# Patient Record
Sex: Male | Born: 1945 | Race: White | Hispanic: No | Marital: Married
Health system: Midwestern US, Academic
[De-identification: ages and names within clinical notes are randomized; demographics above are authoritative.]

---

## 2016-12-27 MED ORDER — ATORVASTATIN 40 MG PO TAB
ORAL_TABLET | Freq: Every day | 3 refills | Status: DC
Start: 2016-12-27 — End: 2018-01-31

## 2017-02-03 ENCOUNTER — Encounter: Admit: 2017-02-03 | Discharge: 2017-02-03 | Payer: MEDICARE

## 2017-02-04 MED ORDER — CANDESARTAN 32 MG PO TAB
ORAL_TABLET | Freq: Every day | ORAL | 3 refills | Status: AC
Start: 2017-02-04 — End: 2018-02-13

## 2017-03-02 ENCOUNTER — Encounter: Admit: 2017-03-02 | Discharge: 2017-03-02 | Payer: MEDICARE

## 2017-03-02 NOTE — Telephone Encounter
VM from wife reporting BP detected HR of 38 bpm then 45 bmp today in clinic. EKG done, stable HR 66.     She said they were at the nephrologist today when the Marc Nelson's VS were taken with the machine, HR was 38 then 45 bmp. She said the doctor listened to his heart then and reported if "sounded good" and ECG revealed HR of 66.  Pt was asymptomatic.     Pt has medtronic PPM with remote. Pt to send tonight when he gets home. Explained PVC's to wife and likely the BP cuff was unable to detect HR accurately. Pt has PMC of PVC's and remote reveals relatively high PVC count.     We added pt to schedule for 9/4 and will review remote to see if findings warrant sooner work in. Wife says he has generalized "tiredness" most days but is out of the home now running errands and essentially asymptomatic.     Will review remote and go from there.

## 2017-03-03 ENCOUNTER — Encounter: Admit: 2017-03-03 | Discharge: 2017-03-03 | Payer: MEDICARE

## 2017-03-03 NOTE — Telephone Encounter
-----   Message from Foye Deer, RN sent at 03/03/2017  6:01 AM CDT -----  Regarding: RE: pt told HR 38 then 45 in doctor's office today.   Remote sent, all normal, no events. I will chart later today.    Erlene Quan    ----- Message -----  From: Verdis Frederickson, RN  Sent: 03/02/2017   4:57 PM  To: Cherlyn Labella Remote  Subject: pt told HR 38 then 45 in doctor's office tod#    A team,     ECG confirmed 66, has medtronic ppm with high PVC count. Sending a transmission tonight once he gets home. He was asymptomatic at the time.     Please look for and flag me when done.   Thank you

## 2017-03-03 NOTE — Telephone Encounter
Phoned pt/wife to let them  know the remote transmission was WNL. No abnormalities.     No new orders. Will f/u with pt in September.

## 2017-03-14 ENCOUNTER — Encounter: Admit: 2017-03-14 | Discharge: 2017-03-14 | Payer: MEDICARE

## 2017-03-14 LAB — LIPID PROFILE: Lab: 129

## 2017-03-14 LAB — MAGNESIUM: Lab: 2.1

## 2017-03-14 LAB — COMPREHENSIVE METABOLIC PANEL
Lab: 30
Lab: 5.3 — ABNORMAL HIGH
Lab: 1.1
Lab: 109 — ABNORMAL HIGH
Lab: 141 — ABNORMAL LOW
Lab: 23
Lab: 3.3

## 2017-03-16 ENCOUNTER — Encounter: Admit: 2017-03-16 | Discharge: 2017-03-16 | Payer: MEDICARE

## 2017-03-16 NOTE — Progress Notes
Pt's wife called back, states they were not given any additional instructions and he is still taking the Atacand and all other meds.   Routing to OmnicomJennifer Bailey RN

## 2017-03-16 NOTE — Progress Notes
Faxed request [phone]) asking what was ordered for pt's high K+ drawn by Dr. Lucky Rathkeosen's office. They called back with no special orders given.     Nurse answered there were not notes in the chart with orders or that pt was given special instructions. She says they are generally told to increase fluid, decrease potassium in the diet and complete follow up lab work.     Called pt to see if he was given any special instructions. No answer, LVM requesting.

## 2017-03-23 ENCOUNTER — Encounter: Admit: 2017-03-23 | Discharge: 2017-03-23 | Payer: MEDICARE

## 2017-03-23 DIAGNOSIS — I480 Paroxysmal atrial fibrillation: Principal | ICD-10-CM

## 2017-03-23 NOTE — Telephone Encounter
Letter out with lab orders.

## 2017-03-24 ENCOUNTER — Encounter: Admit: 2017-03-24 | Discharge: 2017-03-24 | Payer: MEDICARE

## 2017-03-24 DIAGNOSIS — I480 Paroxysmal atrial fibrillation: Principal | ICD-10-CM

## 2017-03-24 LAB — BASIC METABOLIC PANEL
Lab: 138
Lab: 34 — ABNORMAL HIGH (ref 7–26)
Lab: 4.5
Lab: 66
Lab: 9
Lab: 1.1
Lab: 10
Lab: 112
Lab: 153 — ABNORMAL HIGH (ref 70–100)
Lab: 16 — ABNORMAL LOW (ref 20–32)
Lab: 80

## 2017-03-24 NOTE — Progress Notes
[facility]

## 2017-03-25 ENCOUNTER — Encounter: Admit: 2017-03-25 | Discharge: 2017-03-25 | Payer: MEDICARE

## 2017-03-25 NOTE — Progress Notes
VOV: Contact [facility]'s [facility] Calling Dr. Maude LericheWhitlow to ensure labs were seen and being addressed. per RKB.     Phoned Dr. Garnette CzechWhitlow's office where the labs were drawn and spoke with RN Byrd HesselbachMaria to ensure she addresses the repeat BMP with Dr. Maude LericheWhitlow.   Potassium level has resolved, and she agreed to review with Dr. Maude LericheWhitlow promptly.

## 2017-03-30 ENCOUNTER — Encounter: Admit: 2017-03-30 | Discharge: 2017-03-30 | Payer: MEDICARE

## 2017-03-30 NOTE — Progress Notes
[facility] the [facility] after reviewing with RKB in clinic last week and asking PCP to follow up with abnormal CO2 results.  [facility] agrees, no new orders from [place]. Pt to follow up with us on 04/20/17.    Care Everyhwhere reviewed.

## 2017-04-06 ENCOUNTER — Encounter: Admit: 2017-04-06 | Discharge: 2017-04-06 | Payer: MEDICARE

## 2017-04-06 LAB — BASIC METABOLIC PANEL
Lab: 0.9
Lab: 10
Lab: 100
Lab: 109
Lab: 138
Lab: 164 — ABNORMAL HIGH (ref 70–100)
Lab: 17
Lab: 21
Lab: 4.4
Lab: 8
Lab: 83

## 2017-04-15 ENCOUNTER — Encounter: Admit: 2017-04-15 | Discharge: 2017-04-15 | Payer: MEDICARE

## 2017-04-15 NOTE — Telephone Encounter
MyChart Message with WNL lab results.

## 2017-04-15 NOTE — Telephone Encounter
-----   Message from Myriam JacobsonAshwani Mehta, MD sent at 04/13/2017  5:52 PM CDT -----  BMP stable

## 2017-04-19 ENCOUNTER — Encounter: Admit: 2017-04-19 | Discharge: 2017-04-19 | Payer: MEDICARE

## 2017-04-19 DIAGNOSIS — E119 Type 2 diabetes mellitus without complications: Secondary | ICD-10-CM

## 2017-04-19 DIAGNOSIS — E785 Hyperlipidemia, unspecified: Secondary | ICD-10-CM

## 2017-04-19 DIAGNOSIS — I480 Paroxysmal atrial fibrillation: ICD-10-CM

## 2017-04-19 DIAGNOSIS — I495 Sick sinus syndrome: Secondary | ICD-10-CM

## 2017-04-19 DIAGNOSIS — I208 Other forms of angina pectoris: ICD-10-CM

## 2017-04-19 DIAGNOSIS — I2510 Atherosclerotic heart disease of native coronary artery without angina pectoris: ICD-10-CM

## 2017-04-19 DIAGNOSIS — I10 Essential (primary) hypertension: Principal | ICD-10-CM

## 2017-04-19 DIAGNOSIS — T50905A Adverse effect of unspecified drugs, medicaments and biological substances, initial encounter: ICD-10-CM

## 2017-04-19 DIAGNOSIS — R9439 Abnormal result of other cardiovascular function study: ICD-10-CM

## 2017-04-19 NOTE — Progress Notes
Date of Service: 04/19/2017    Marc Nelson is a 71 y.o. male.       HPI     It was a pleasure to see [name] in my office today for his cardiovascular follow-up. ???As always he was accompanied by his wife.  He denies any chest discomfort, shortness of breath, dizziness or lightheadedness.  No palpitations.  Physical examination is unremarkable.  Vital signs are stable.  His EKG shows a paced rhythm with a long AV delay.  There is a V sensed rhythm.  Followed by a long pause in ventricularly paced beat.  He is completely asymptomatic.  I recommended that we set up a pacemaker check in the office.  Medications were reviewed and no changes at this time.         Vitals:    04/19/17 0853 04/19/17 0901   BP: 123/71 123/66   Pulse: 60    Weight: 84.4 kg (186 lb)    Height: 1.778 m (5' 10)      Body mass index is 26.69 kg/m???.     Past Medical History  Patient Active Problem List    Diagnosis Date Noted   ??? Stable angina (HCC) 10/01/2016   ??? Sinus node dysfunction (HCC) 05/25/2016   ??? 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 Essential hypertension 05/25/2016   ??? Bradycardia 03/24/2016     03/24/2016  dual chamber MDT ppm implanted, ILR removed      ??? 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 Dizziness 12/27/2013   ??? Paroxysmal A-fib (HCC) 09/03/2011     06/20/2012 Holter monitor: revealed normal sinus rhythm, short runs of supraventricular arrhythmias, maximum of 10 beats were noted. There were no episodes of any sustained ventricular or supraventricular arrhythmias noted.   06/21/2012 Echo: EF 60 %, normal LV systolic function. Mild mitral and tricuspid regurgitations. PA pressures were 33 mmHg. LA size 5.4 cm.   07/23/2013 Stress thallium: The patient exercised for 11 minute and 54 seconds. During peak exercise, there was 1 mm horizontal ST depression in II, III, aVF, and V5 and V6. However, SPECT images did not show any significant ischemia. Ejection fraction was noted to be 61 percent. 12/27/13 Holter: The predominating rhythm was NS. Rare Supraventricular Ectopic Beats were observed including Atrial Couplets and a Triplet. Rare Ventricular Ectopic Beats were recognized including Rare Couplets. Patient complained of SOB and weakness. No correlation of his symptoms were noted to any underlying arrhythmia.   06/21/2014 Exercise stress thallium: noted to be normal.   05/06/15 Echo: The LV EF is about 60%. The LA is mildly enlarged. Mild MR & mild TR noted. The PA systolic pressure is normal. LA size 4.5 cm.   10/20/2015 Regadenoson stress Cardiolite [facility]): no evidence of any significant ischemia and normal ejection fraction at 69%.  10/24/2015 Echo: normal left ventricular function.??? Ejection fraction 65%.??? 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 LA size 4.3 cm. ??? ???  11/24/2015 30-day looping event monitor: multiple episodes of atrial fibrillation and flutter with ventricular rates ranging between 80-110 beats per minute.??? One episode of atrial fibrillation was associated with complaints of heart racing  01/06/16 s/p Medtronic LINQ cardiac memory loop recorder at the anterior chest wall (Dr. Bernette Mayers)      ??? Thrombocytopenia (HCC) 08/26/2011   ??? AKI (acute kidney  injury) (HCC) 08/26/2011   ??? CAD (coronary artery disease) 08/23/2011   ??? Angina of effort (HCC) 08/23/2011   ??? Chest pain 07/30/2011   ??? SOB (shortness of breath) 07/15/2011   ??? Dyslipidemia 10/22/2009   ??? DM II (diabetes mellitus, type II), controlled (HCC) 10/22/2009   ??? HTN, goal below 140/90 01/02/2009         Review of Systems   HENT: Positive for hearing loss and tinnitus.    Cardiovascular: Positive for dyspnea on exertion.   Musculoskeletal: Positive for arthritis, joint pain and neck pain.   Gastrointestinal: Positive for flatus. Allergic/Immunologic: Positive for environmental allergies.   All other systems reviewed and are negative.      Physical Exam  General Appearance: resting comfortably, no acute distress   Neck Veins: neck veins are flat, neck veins are not distended   Thyroid: no nodules, masses, tenderness or enlargement   Chest Inspection: chest is normal in appearance, CABG incision   Respiratory Effort: breathing is unlabored, no respiratory distress   Auscultation/Percussion: lungs clear to auscultation, no rales, rhonchi, or wheezing   PMI: PMI not enlarged or displaced   Cardiac Rhythm: regular rhythm and normal rate   Cardiac Auscultation: Normal S1 &???S2, no S3 or S4, no rub   Murmurs: soft holosystolic murmur 2/6 at apex with no radiation   Carotid Arteries: normal carotid upstroke bilaterally, no bruits   Pedal Pulses: normal symmetric pedal pulses   Lower Extremity Edema: no lower extremity edema   Abdominal Exam: soft, non-tender, no masses, bowel sounds normal   Abdominal Aorta: nonpalpable abdominal aorta; no abdominal bruits   Liver &???Spleen: no organomegaly   Neurologic Exam: neurological assessment grossly intact   Orientation: oriented to time, place and person    Cardiovascular Studies  June 20, 2012: A 24-hour Holter monitor revealed normal sinus rhythm, short runs of supraventricular arrhythmias, maximum of 10 beats were noted. There were no episodes of any sustained ventricular or supraventricular arrhythmias noted. June 21, 2012: Echo Doppler ejection fraction 60 percent, normal left ventricular systolic function. Mild mitral and tricuspid regurgitations. PA pressures were 33 mmHg. July 23, 2013, stress thallium: The patient exercised for 11 minute and 54 seconds. During peak exercise, there was 1 mm horizontal ST depression in II, III, aVF, and V5 and V6. However, SPECT images did not show any significant ischemia. Ejection fraction was noted to be 61 percent. June 21, 2014, exercise stress thallium noted to be normal. EKG today shows sinus bradycardia, rate of 46 beats per minute. First-degree AV block. No significant ST-T changes are noted. No significant changes compared to patient's previous electrocardiogram done on June 19, 2014   October 20, 2015, regadenoson stress Cardiolite done at [facility] revealed no evidence of any significant ischemia and normal ejection fraction at 69%.  October 24, 2015, echo Doppler???shows normal left ventricular function. ???Ejection fraction 65%. 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 ???  ???November 24, 2015, a 30-day looping event monitor showed multiple episodes of atrial fibrillation and flutter with ventricular rates ranging between 80-110 beats per minute. ???One episode of atrial fibrillation was associated with complaints of heart racing.  March 25, 2016 placement of a permanent pacemaker. ???This a dual-chamber Medtronic pacemaker  Today's EKG as noted above.  Problems Addressed Today  No diagnosis found.    Assessment and Plan  1. ???History of paroxysmal atrial fibrillation. ???Patient maintaining a paced rhythm.  Patient on Eliquis.  2. 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 ???Remote device check showed a normally functioning device.  Will need a office pacemaker interrogation.  3. ???Coronary artery disease status post coronary artery bypass graft surgery. ???Clinically and hemodynamically doing well with no complaints of any chest discomfort. ???Stress test done in March 2017 at [facility] and revealed no evidence of any significant ischemia. ???Ejection fraction is normal at 69%.  4. ???Hypertension. ???Blood pressures are pretty well controlled.  5. ???Dyslipidemia. ???Good lipid numbers.  6. ???Diabetes mellitus. ???Patient follows his PCP. Current Medications (including today's revisions)  ??? aspirin EC 81 mg tablet Take 81 mg by mouth daily. Take with food.   ??? atorvastatin (LIPITOR) 40 mg tablet TAKE ONE TABLET BY MOUTH DAILY   ??? candesartan (ATACAND) 32 mg tablet TAKE ONE-HALF TABLET BY MOUTH DAILY   ??? CETIRIZINE HCL (ZYRTEC PO) Take 1 Tab by mouth as Needed.   ??? cholecalciferol (Vitamin D3) (VITAMIN D-3) 1,000 units tablet Take 2,000 Units by mouth daily.     ??? citalopram (CELEXA) 10 mg tablet Take 10 mg by mouth daily.   ??? Coenzyme Q10 150 mg cap Take 1 Cap by mouth daily.   ??? ELIQUIS 5 mg tablet TAKE ONE TABLET BY MOUTH TWICE A DAY   ??? INSULIN GLARGINE,HUM.REC.ANLOG (LANTUS SC) Inject 25 Units under the skin at bedtime daily.   ??? metoprolol tartrate (LOPRESSOR) 25 mg tablet Take 1 tablet by mouth twice daily.   ??? nitroglycerin (NITROSTAT) 0.4 mg tablet Place 1 tab under tongue if having chest pain. May take 1 tab every 5 minutes up to 3 tabs. If chest pain persists after 3 doses, call 911.   ??? NOVOLOG FLEXPEN 100 unit/mL injection PEN 3 Units.   ??? omeprazole DR(+) (PRILOSEC) 20 mg capsule Take 2 Caps by mouth daily. (Patient taking differently: Take 20 mg by mouth daily.)   ??? travoprost(+) (TRAVATAN Z) 0.004 % drop ophthalmic solution Apply 1 drop to both eyes at bedtime daily.

## 2017-04-20 ENCOUNTER — Ambulatory Visit: Admit: 2017-04-19 | Discharge: 2017-04-20 | Payer: MEDICARE

## 2017-04-20 DIAGNOSIS — I480 Paroxysmal atrial fibrillation: Principal | ICD-10-CM

## 2017-04-20 DIAGNOSIS — I2510 Atherosclerotic heart disease of native coronary artery without angina pectoris: ICD-10-CM

## 2017-04-20 DIAGNOSIS — Z951 Presence of aortocoronary bypass graft: ICD-10-CM

## 2017-04-20 DIAGNOSIS — Z950 Presence of cardiac pacemaker: ICD-10-CM

## 2017-04-20 DIAGNOSIS — R9431 Abnormal electrocardiogram [ECG] [EKG]: ICD-10-CM

## 2017-04-20 DIAGNOSIS — E119 Type 2 diabetes mellitus without complications: ICD-10-CM

## 2017-04-20 DIAGNOSIS — Z794 Long term (current) use of insulin: ICD-10-CM

## 2017-04-20 DIAGNOSIS — I10 Essential (primary) hypertension: ICD-10-CM

## 2017-04-20 DIAGNOSIS — Z7901 Long term (current) use of anticoagulants: ICD-10-CM

## 2017-04-21 ENCOUNTER — Encounter: Admit: 2017-04-21 | Discharge: 2017-04-21 | Payer: MEDICARE

## 2017-05-10 ENCOUNTER — Ambulatory Visit: Admit: 2017-05-10 | Discharge: 2017-05-11 | Payer: MEDICARE

## 2017-05-11 DIAGNOSIS — I495 Sick sinus syndrome: Principal | ICD-10-CM

## 2017-05-11 DIAGNOSIS — Z950 Presence of cardiac pacemaker: ICD-10-CM

## 2017-05-13 ENCOUNTER — Ambulatory Visit: Admit: 2017-05-13 | Discharge: 2017-05-13 | Payer: MEDICARE

## 2017-05-13 DIAGNOSIS — I480 Paroxysmal atrial fibrillation: Principal | ICD-10-CM

## 2017-05-13 MED ORDER — PERFLUTREN LIPID MICROSPHERES 1.1 MG/ML IV SUSP
1-20 mL | Freq: Once | INTRAVENOUS | 0 refills | Status: CP
Start: 2017-05-13 — End: ?
  Administered 2017-05-13: 14:00:00 2.5 mL via INTRAVENOUS

## 2017-05-13 NOTE — Progress Notes
Peripheral IV Insertion Note:  Patient Side: left  Line Orientation:Antecubital  IV Catheter Size: 24G  Number of Attempts:2.  IV capped and flushed with Normal Saline.  IV site without redness, swelling, or pain.  New dressing placed.    After procedure IV cannula removed intact and hemostasis achieved.

## 2017-05-19 ENCOUNTER — Encounter: Admit: 2017-05-19 | Discharge: 2017-05-19 | Payer: MEDICARE

## 2017-05-19 NOTE — Telephone Encounter
-----   Message from Myriam Jacobson, MD sent at 05/19/2017  4:52 PM CDT -----  Echocardiogram revealed normal left ventricular function with ejection fraction of around 50%.  No significant changes from his last echo Doppler.  We will continue present medical management.

## 2017-05-19 NOTE — Telephone Encounter
Mychart message out with stable echocardiogram results.

## 2017-06-02 ENCOUNTER — Encounter: Admit: 2017-06-02 | Discharge: 2017-06-02 | Payer: MEDICARE

## 2017-06-02 MED ORDER — METOPROLOL TARTRATE 25 MG PO TAB
ORAL_TABLET | Freq: Two times a day (BID) | ORAL | 3 refills | 90.00000 days | Status: AC
Start: 2017-06-02 — End: 2018-06-29

## 2017-06-13 ENCOUNTER — Encounter: Admit: 2017-06-13 | Discharge: 2017-06-13 | Payer: MEDICARE

## 2017-06-13 LAB — LIPID PROFILE
Lab: 14
Lab: 37
Lab: 53
Lab: 67
Lab: 104

## 2017-06-13 LAB — COMPREHENSIVE METABOLIC PANEL
Lab: 0.4
Lab: 139
Lab: 20
Lab: 22
Lab: 4.1
Lab: 59
Lab: 6.3
Lab: 87

## 2017-06-13 LAB — MAGNESIUM: Lab: 1.9

## 2017-06-13 NOTE — Progress Notes
Lab entered for historical purpose

## 2017-06-30 ENCOUNTER — Encounter: Admit: 2017-06-30 | Discharge: 2017-06-30 | Payer: MEDICARE

## 2017-06-30 DIAGNOSIS — E785 Hyperlipidemia, unspecified: ICD-10-CM

## 2017-06-30 DIAGNOSIS — I495 Sick sinus syndrome: Principal | ICD-10-CM

## 2017-08-10 ENCOUNTER — Ambulatory Visit: Admit: 2017-08-10 | Discharge: 2017-08-11 | Payer: MEDICARE

## 2017-08-11 ENCOUNTER — Encounter: Admit: 2017-08-11 | Discharge: 2017-08-11 | Payer: MEDICARE

## 2017-08-11 DIAGNOSIS — I495 Sick sinus syndrome: Principal | ICD-10-CM

## 2017-08-11 DIAGNOSIS — Z950 Presence of cardiac pacemaker: ICD-10-CM

## 2017-08-29 LAB — LIPID PROFILE
Lab: 136 fL — ABNORMAL LOW (ref 4.5–6.5)
Lab: 14 10*3/uL — ABNORMAL HIGH (ref 27–32)
Lab: 3.2 {cells}/uL — ABNORMAL LOW (ref >60)
Lab: 43 g/dL (ref 32.0–36.0)
Lab: 71 pg — ABNORMAL HIGH (ref 27.0–33.0)
Lab: 79 % (ref 11.0–15.0)
Lab: 93 fL — ABNORMAL LOW (ref >60)

## 2017-08-29 LAB — CBC
Lab: 11 {cells}/uL (ref 200–950)
Lab: 6.4 % — ABNORMAL LOW (ref >=60)

## 2017-08-29 LAB — TOTAL T3 (TRIIODOTHYRONINE): Lab: 110

## 2017-08-29 LAB — COMPREHENSIVE METABOLIC PANEL
Lab: 141 g/dL — ABNORMAL LOW (ref >=60)
Lab: 22 {cells}/uL (ref 0–200)
Lab: 27 % (ref 33–130)
Lab: 80 {cells}/uL (ref 15–500)

## 2017-08-29 LAB — FREE T4 (FREE THYROXINE) ONLY: Lab: 0.9 — ABNORMAL LOW (ref 0.93–1.7)

## 2017-08-29 LAB — THYROID STIMULATING HORMONE-TSH: Lab: 1.8

## 2017-08-29 LAB — 25-OH VITAMIN D (D2 + D3): Lab: 34

## 2017-10-03 ENCOUNTER — Encounter: Admit: 2017-10-03 | Discharge: 2017-10-03 | Payer: MEDICARE

## 2017-10-04 ENCOUNTER — Encounter: Admit: 2017-10-04 | Discharge: 2017-10-04 | Payer: MEDICARE

## 2017-10-21 ENCOUNTER — Encounter: Admit: 2017-10-21 | Discharge: 2017-10-21 | Payer: MEDICARE

## 2017-10-21 DIAGNOSIS — I480 Paroxysmal atrial fibrillation: ICD-10-CM

## 2017-10-21 DIAGNOSIS — Z950 Presence of cardiac pacemaker: Secondary | ICD-10-CM

## 2017-10-21 DIAGNOSIS — T50905A Adverse effect of unspecified drugs, medicaments and biological substances, initial encounter: ICD-10-CM

## 2017-10-21 DIAGNOSIS — I2510 Atherosclerotic heart disease of native coronary artery without angina pectoris: ICD-10-CM

## 2017-10-21 DIAGNOSIS — I208 Other forms of angina pectoris: ICD-10-CM

## 2017-10-21 DIAGNOSIS — I10 Essential (primary) hypertension: Principal | ICD-10-CM

## 2017-10-21 DIAGNOSIS — R9439 Abnormal result of other cardiovascular function study: ICD-10-CM

## 2017-10-21 DIAGNOSIS — E119 Type 2 diabetes mellitus without complications: Secondary | ICD-10-CM

## 2017-10-21 DIAGNOSIS — Z951 Presence of aortocoronary bypass graft: Secondary | ICD-10-CM

## 2017-10-21 DIAGNOSIS — E785 Hyperlipidemia, unspecified: Secondary | ICD-10-CM

## 2017-10-22 ENCOUNTER — Ambulatory Visit: Admit: 2017-10-21 | Discharge: 2017-10-22 | Payer: MEDICARE

## 2017-10-22 DIAGNOSIS — I10 Essential (primary) hypertension: ICD-10-CM

## 2017-10-22 DIAGNOSIS — I2510 Atherosclerotic heart disease of native coronary artery without angina pectoris: ICD-10-CM

## 2017-10-22 DIAGNOSIS — I480 Paroxysmal atrial fibrillation: ICD-10-CM

## 2017-10-22 DIAGNOSIS — I495 Sick sinus syndrome: ICD-10-CM

## 2017-10-22 DIAGNOSIS — E785 Hyperlipidemia, unspecified: ICD-10-CM

## 2017-10-22 DIAGNOSIS — Z7901 Long term (current) use of anticoagulants: ICD-10-CM

## 2017-10-22 DIAGNOSIS — R0789 Other chest pain: Principal | ICD-10-CM

## 2017-10-22 DIAGNOSIS — E119 Type 2 diabetes mellitus without complications: ICD-10-CM

## 2017-11-09 ENCOUNTER — Ambulatory Visit: Admit: 2017-11-09 | Discharge: 2017-11-10 | Payer: MEDICARE

## 2017-11-09 ENCOUNTER — Encounter: Admit: 2017-11-09 | Discharge: 2017-11-09 | Payer: MEDICARE

## 2017-11-09 DIAGNOSIS — Z950 Presence of cardiac pacemaker: Secondary | ICD-10-CM

## 2017-11-10 DIAGNOSIS — I495 Sick sinus syndrome: Principal | ICD-10-CM

## 2017-11-10 DIAGNOSIS — I440 Atrioventricular block, first degree: ICD-10-CM

## 2017-11-16 ENCOUNTER — Encounter: Admit: 2017-11-16 | Discharge: 2017-11-16 | Payer: MEDICARE

## 2017-11-16 MED ORDER — ELIQUIS 5 MG PO TAB
ORAL_TABLET | Freq: Two times a day (BID) | 10 refills | Status: AC
Start: 2017-11-16 — End: 2018-10-30

## 2017-11-17 ENCOUNTER — Encounter: Admit: 2017-11-17 | Discharge: 2017-11-17 | Payer: MEDICARE

## 2017-11-17 DIAGNOSIS — R9439 Abnormal result of other cardiovascular function study: ICD-10-CM

## 2017-11-17 DIAGNOSIS — E119 Type 2 diabetes mellitus without complications: ICD-10-CM

## 2017-11-17 DIAGNOSIS — I480 Paroxysmal atrial fibrillation: ICD-10-CM

## 2017-11-17 DIAGNOSIS — E785 Hyperlipidemia, unspecified: ICD-10-CM

## 2017-11-17 DIAGNOSIS — T50905A Adverse effect of unspecified drugs, medicaments and biological substances, initial encounter: ICD-10-CM

## 2017-11-17 DIAGNOSIS — I2510 Atherosclerotic heart disease of native coronary artery without angina pectoris: ICD-10-CM

## 2017-11-17 DIAGNOSIS — I208 Other forms of angina pectoris: ICD-10-CM

## 2017-11-17 DIAGNOSIS — I10 Essential (primary) hypertension: Principal | ICD-10-CM

## 2017-12-26 ENCOUNTER — Encounter: Admit: 2017-12-26 | Discharge: 2017-12-26 | Payer: MEDICARE

## 2017-12-26 DIAGNOSIS — Z950 Presence of cardiac pacemaker: ICD-10-CM

## 2017-12-26 DIAGNOSIS — I495 Sick sinus syndrome: Principal | ICD-10-CM

## 2018-01-31 ENCOUNTER — Encounter: Admit: 2018-01-31 | Discharge: 2018-01-31 | Payer: MEDICARE

## 2018-01-31 DIAGNOSIS — E785 Hyperlipidemia, unspecified: Principal | ICD-10-CM

## 2018-01-31 MED ORDER — ATORVASTATIN 40 MG PO TAB
ORAL_TABLET | Freq: Every day | 2 refills | Status: AC
Start: 2018-01-31 — End: 2018-11-06

## 2018-02-08 ENCOUNTER — Ambulatory Visit: Admit: 2018-02-08 | Discharge: 2018-02-09 | Payer: MEDICARE

## 2018-02-09 ENCOUNTER — Encounter: Admit: 2018-02-09 | Discharge: 2018-02-09 | Payer: MEDICARE

## 2018-02-09 DIAGNOSIS — Z950 Presence of cardiac pacemaker: Principal | ICD-10-CM

## 2018-02-13 ENCOUNTER — Encounter: Admit: 2018-02-13 | Discharge: 2018-02-13 | Payer: MEDICARE

## 2018-02-13 MED ORDER — CANDESARTAN 32 MG PO TAB
ORAL_TABLET | Freq: Every day | ORAL | 2 refills | Status: AC
Start: 2018-02-13 — End: 2018-11-20

## 2018-02-15 ENCOUNTER — Encounter: Admit: 2018-02-15 | Discharge: 2018-02-15 | Payer: MEDICARE

## 2018-02-22 ENCOUNTER — Encounter: Admit: 2018-02-22 | Discharge: 2018-02-22 | Payer: MEDICARE

## 2018-02-22 LAB — THYROID STIMULATING HORMONE-TSH: Lab: 2.5

## 2018-02-22 LAB — FREE T4 (FREE THYROXINE) ONLY: Lab: 0.9

## 2018-02-27 ENCOUNTER — Encounter: Admit: 2018-02-27 | Discharge: 2018-02-27 | Payer: MEDICARE

## 2018-02-27 LAB — LIPID PROFILE
Lab: 126
Lab: 13
Lab: 45 — ABNORMAL LOW
Lab: 63
Lab: 68

## 2018-04-25 ENCOUNTER — Ambulatory Visit: Admit: 2018-04-25 | Discharge: 2018-04-26 | Payer: MEDICARE

## 2018-04-25 ENCOUNTER — Encounter: Admit: 2018-04-25 | Discharge: 2018-04-25 | Payer: MEDICARE

## 2018-04-25 DIAGNOSIS — I10 Essential (primary) hypertension: Principal | ICD-10-CM

## 2018-04-25 DIAGNOSIS — T50905A Adverse effect of unspecified drugs, medicaments and biological substances, initial encounter: ICD-10-CM

## 2018-04-25 DIAGNOSIS — I2510 Atherosclerotic heart disease of native coronary artery without angina pectoris: ICD-10-CM

## 2018-04-25 DIAGNOSIS — R9439 Abnormal result of other cardiovascular function study: ICD-10-CM

## 2018-04-25 DIAGNOSIS — E785 Hyperlipidemia, unspecified: Secondary | ICD-10-CM

## 2018-04-25 DIAGNOSIS — I208 Other forms of angina pectoris: ICD-10-CM

## 2018-04-25 DIAGNOSIS — I480 Paroxysmal atrial fibrillation: ICD-10-CM

## 2018-04-25 DIAGNOSIS — E119 Type 2 diabetes mellitus without complications: Secondary | ICD-10-CM

## 2018-04-25 MED ORDER — NITROGLYCERIN 0.4 MG SL SUBL
ORAL_TABLET | SUBLINGUAL | 1 refills | 9.00000 days | Status: AC
Start: 2018-04-25 — End: 2019-07-16

## 2018-04-26 ENCOUNTER — Encounter: Admit: 2018-04-26 | Discharge: 2018-04-26 | Payer: MEDICARE

## 2018-04-26 DIAGNOSIS — I2510 Atherosclerotic heart disease of native coronary artery without angina pectoris: Principal | ICD-10-CM

## 2018-04-26 DIAGNOSIS — Z951 Presence of aortocoronary bypass graft: ICD-10-CM

## 2018-04-26 DIAGNOSIS — I440 Atrioventricular block, first degree: ICD-10-CM

## 2018-04-26 DIAGNOSIS — E119 Type 2 diabetes mellitus without complications: ICD-10-CM

## 2018-04-26 DIAGNOSIS — R0602 Shortness of breath: ICD-10-CM

## 2018-04-26 DIAGNOSIS — R0789 Other chest pain: ICD-10-CM

## 2018-04-26 DIAGNOSIS — E785 Hyperlipidemia, unspecified: ICD-10-CM

## 2018-04-26 DIAGNOSIS — I495 Sick sinus syndrome: ICD-10-CM

## 2018-04-26 DIAGNOSIS — Z950 Presence of cardiac pacemaker: Secondary | ICD-10-CM

## 2018-04-26 DIAGNOSIS — I480 Paroxysmal atrial fibrillation: Secondary | ICD-10-CM

## 2018-05-12 ENCOUNTER — Encounter: Admit: 2018-05-12 | Discharge: 2018-05-12 | Payer: MEDICARE

## 2018-05-12 ENCOUNTER — Ambulatory Visit: Admit: 2018-05-12 | Discharge: 2018-05-12 | Payer: MEDICARE

## 2018-05-12 DIAGNOSIS — Z950 Presence of cardiac pacemaker: Principal | ICD-10-CM

## 2018-05-15 ENCOUNTER — Ambulatory Visit: Admit: 2018-05-15 | Discharge: 2018-05-16 | Payer: MEDICARE

## 2018-05-15 ENCOUNTER — Ambulatory Visit: Admit: 2018-05-15 | Discharge: 2018-05-15 | Payer: MEDICARE

## 2018-05-15 DIAGNOSIS — Z951 Presence of aortocoronary bypass graft: ICD-10-CM

## 2018-05-15 DIAGNOSIS — I10 Essential (primary) hypertension: ICD-10-CM

## 2018-05-15 DIAGNOSIS — Z950 Presence of cardiac pacemaker: ICD-10-CM

## 2018-05-15 DIAGNOSIS — E785 Hyperlipidemia, unspecified: ICD-10-CM

## 2018-05-15 DIAGNOSIS — I495 Sick sinus syndrome: ICD-10-CM

## 2018-05-15 DIAGNOSIS — I480 Paroxysmal atrial fibrillation: ICD-10-CM

## 2018-05-15 DIAGNOSIS — R0789 Other chest pain: ICD-10-CM

## 2018-05-15 DIAGNOSIS — I2510 Atherosclerotic heart disease of native coronary artery without angina pectoris: Principal | ICD-10-CM

## 2018-05-15 MED ORDER — ALBUTEROL SULFATE 90 MCG/ACTUATION IN HFAA
2 | RESPIRATORY_TRACT | 0 refills | Status: DC | PRN
Start: 2018-05-15 — End: 2018-05-20

## 2018-05-15 MED ORDER — SODIUM CHLORIDE 0.9 % IV SOLP
250 mL | INTRAVENOUS | 0 refills | Status: AC | PRN
Start: 2018-05-15 — End: ?

## 2018-05-15 MED ORDER — AMINOPHYLLINE 500 MG/20 ML IV SOLN
50 mg | INTRAVENOUS | 0 refills | Status: AC | PRN
Start: 2018-05-15 — End: ?
  Administered 2018-05-15: 14:00:00 50 mg via INTRAVENOUS

## 2018-05-15 MED ORDER — REGADENOSON 0.4 MG/5 ML IV SYRG
.4 mg | Freq: Once | INTRAVENOUS | 0 refills | Status: CP
Start: 2018-05-15 — End: ?
  Administered 2018-05-15: 14:00:00 0.4 mg via INTRAVENOUS

## 2018-05-15 MED ORDER — NITROGLYCERIN 0.4 MG SL SUBL
.4 mg | SUBLINGUAL | 0 refills | Status: AC | PRN
Start: 2018-05-15 — End: ?

## 2018-05-16 ENCOUNTER — Encounter: Admit: 2018-05-16 | Discharge: 2018-05-16 | Payer: MEDICARE

## 2018-05-23 ENCOUNTER — Encounter: Admit: 2018-05-23 | Discharge: 2018-05-23 | Payer: MEDICARE

## 2018-06-29 ENCOUNTER — Encounter: Admit: 2018-06-29 | Discharge: 2018-06-29 | Payer: MEDICARE

## 2018-06-29 MED ORDER — METOPROLOL TARTRATE 25 MG PO TAB
25 mg | ORAL_TABLET | Freq: Two times a day (BID) | ORAL | 3 refills | 90.00000 days | Status: AC
Start: 2018-06-29 — End: 2019-07-02

## 2018-08-02 ENCOUNTER — Encounter: Admit: 2018-08-02 | Discharge: 2018-08-02 | Payer: MEDICARE

## 2018-08-02 DIAGNOSIS — I495 Sick sinus syndrome: Principal | ICD-10-CM

## 2018-08-17 ENCOUNTER — Ambulatory Visit: Admit: 2018-08-17 | Discharge: 2018-08-18 | Payer: MEDICARE

## 2018-08-18 DIAGNOSIS — I495 Sick sinus syndrome: Secondary | ICD-10-CM

## 2018-08-18 DIAGNOSIS — Z950 Presence of cardiac pacemaker: Secondary | ICD-10-CM

## 2018-08-28 ENCOUNTER — Encounter: Admit: 2018-08-28 | Discharge: 2018-08-28 | Payer: MEDICARE

## 2018-08-28 DIAGNOSIS — Z950 Presence of cardiac pacemaker: Secondary | ICD-10-CM

## 2018-08-29 ENCOUNTER — Encounter: Admit: 2018-08-29 | Discharge: 2018-08-29 | Payer: MEDICARE

## 2018-09-13 ENCOUNTER — Encounter: Admit: 2018-09-13 | Discharge: 2018-09-13 | Payer: MEDICARE

## 2018-10-12 ENCOUNTER — Encounter: Admit: 2018-10-12 | Discharge: 2018-10-12 | Payer: MEDICARE

## 2018-10-23 ENCOUNTER — Encounter: Admit: 2018-10-23 | Discharge: 2018-10-23 | Payer: MEDICARE

## 2018-10-25 ENCOUNTER — Encounter: Admit: 2018-10-25 | Discharge: 2018-10-25 | Payer: MEDICARE

## 2018-10-25 ENCOUNTER — Ambulatory Visit: Admit: 2018-10-25 | Discharge: 2018-10-26 | Payer: MEDICARE

## 2018-10-25 DIAGNOSIS — I10 Essential (primary) hypertension: Principal | ICD-10-CM

## 2018-10-25 DIAGNOSIS — I208 Other forms of angina pectoris: ICD-10-CM

## 2018-10-25 DIAGNOSIS — I2510 Atherosclerotic heart disease of native coronary artery without angina pectoris: ICD-10-CM

## 2018-10-25 DIAGNOSIS — R9439 Abnormal result of other cardiovascular function study: ICD-10-CM

## 2018-10-25 DIAGNOSIS — I480 Paroxysmal atrial fibrillation: ICD-10-CM

## 2018-10-25 DIAGNOSIS — E785 Hyperlipidemia, unspecified: Secondary | ICD-10-CM

## 2018-10-25 DIAGNOSIS — E119 Type 2 diabetes mellitus without complications: Secondary | ICD-10-CM

## 2018-10-25 DIAGNOSIS — T50905A Adverse effect of unspecified drugs, medicaments and biological substances, initial encounter: ICD-10-CM

## 2018-10-25 NOTE — Progress Notes
Date of Service: 10/25/2018    Marc Nelson is a 73 y.o. male.       HPI     I had the pleasure of seeing [name] in my office today for his cardiovascular followup.     Overall, he seems to be doing fairly good.  When he works very hard, he gets tired more easily.  According to the wife, that is because he overdoes things.  He has had no chest pains.  He denies any significant shortness of breath.     The patient is going for a prostate biopsy.  Urology consultant wanted a clearance.  I think he should be low risk for his planned prostate surgery.  They can hold his Eliquis for 3 days and aspirin for 7 days.       On physical examination, his vital signs are stable.  Lungs are clear.  Heart sounds are regular.  Extremities showed no edema.  EKG shows he is maintaining a sinus rhythm.       We will review his medications.  I reviewed his lab workup.  He does need a fasting lipid profile and a complete metabolic profile checked in the next few weeks.     Total time spent with the patient was 25 minutes.  This included assessment and reviewing his last stress test and echo Doppler with him and giving him the clearance for his prostate biopsy.    (IEP:[id number])             Vitals:    10/25/18 1527 10/25/18 1540   BP: 122/80 118/78   BP Source: Arm, Left Upper Arm, Right Upper   Pulse: 61    Weight: 85.1 kg (187 lb 11.2 oz)    Height: 1.778 m (5' 10)    PainSc: Zero      Body mass index is 26.93 kg/m???.     Past Medical History  Patient Active Problem List    Diagnosis Date Noted   ??? Hyperlipemia 06/30/2017   ??? Stable angina (HCC) 10/01/2016   ??? Sinus node dysfunction (HCC) 05/25/2016     Pacemaker in place.      ??? 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 Essential hypertension 05/25/2016   ??? Bradycardia 03/24/2016     03/24/2016  dual chamber MDT ppm implanted, ILR removed      ??? 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 Dizziness 12/27/2013   ??? Paroxysmal A-fib (HCC) 09/03/2011 06/20/2012 Holter monitor: revealed normal sinus rhythm, short runs of supraventricular arrhythmias, maximum of 10 beats were noted. There were no episodes of any sustained ventricular or supraventricular arrhythmias noted.   06/21/2012 Echo: EF 60 %, normal LV systolic function. Mild mitral and tricuspid regurgitations. PA pressures were 33 mmHg. LA size 5.4 cm.   07/23/2013 Stress thallium: The patient exercised for 11 minute and 54 seconds. During peak exercise, there was 1 mm horizontal ST depression in II, III, aVF, and V5 and V6. However, SPECT images did not show any significant ischemia. Ejection fraction was noted to be 61 percent.   12/27/13 Holter: The predominating rhythm was NS. Rare Supraventricular Ectopic Beats were observed including Atrial Couplets and a Triplet. Rare Ventricular Ectopic Beats were recognized including Rare Couplets. Patient complained of SOB and weakness. No correlation of his symptoms were noted to any underlying arrhythmia.   06/21/2014 Exercise stress thallium: noted to be normal.   05/06/15 Echo: The LV EF is about 60%.  The LA is mildly enlarged. Mild MR & mild TR noted. The PA systolic pressure is normal. LA size 4.5 cm.   10/20/2015 Regadenoson stress Cardiolite [facility]): no evidence of any significant ischemia and normal ejection fraction at 69%.  10/24/2015 Echo: normal left ventricular function.??? Ejection fraction 65%.??? 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 LA size 4.3 cm. ??? ???  11/24/2015 30-day looping event monitor: multiple episodes of atrial fibrillation and flutter with ventricular rates ranging between 80-110 beats per minute.??? One episode of atrial fibrillation was associated with complaints of heart racing  01/06/16 s/p Medtronic LINQ cardiac memory loop recorder at the anterior chest wall (Dr. Bernette Mayers)      ??? Thrombocytopenia (HCC) 08/26/2011   ??? AKI (acute kidney injury) (HCC) 08/26/2011   ??? CAD (coronary artery disease) 08/23/2011   ??? Angina of effort (HCC) 08/23/2011   ??? Chest pain 07/30/2011   ??? SOB (shortness of breath) 07/15/2011   ??? Dyslipidemia 10/22/2009   ??? DM II (diabetes mellitus, type II), controlled (HCC) 10/22/2009   ??? HTN, goal below 140/90 01/02/2009         Review of Systems   Constitution: Negative.   HENT: Positive for tinnitus.    Eyes: Negative.    Cardiovascular: Positive for chest pain and dyspnea on exertion.   Respiratory: Negative.    Endocrine: Negative.    Hematologic/Lymphatic: Negative.    Skin: Negative.    Gastrointestinal: Negative.    Genitourinary: Negative.    Neurological: Negative.    Psychiatric/Behavioral: Negative.    Allergic/Immunologic: Negative.        Physical Exam  General Appearance: resting comfortably, no acute distress   Neck Veins: neck veins are flat, neck veins are not distended   Thyroid: no nodules, masses, tenderness or enlargement   Chest Inspection: chest is normal in appearance, CABG incision   Respiratory Effort: breathing is unlabored, no respiratory distress   Auscultation/Percussion: lungs clear to auscultation, no rales, rhonchi, or wheezing   PMI: PMI not enlarged or displaced   Cardiac Rhythm: regular rhythm and normal rate   Cardiac Auscultation: Normal S1 &???S2, no S3 or S4, no rub   Murmurs: soft holosystolic murmur 2/6 at apex with no radiation   Carotid Arteries: normal carotid upstroke bilaterally, no bruits   Pedal Pulses: normal symmetric pedal pulses   Lower Extremity Edema: no lower extremity edema   Abdominal Exam: soft, non-tender, no masses, bowel sounds normal   Abdominal Aorta: nonpalpable abdominal aorta; no abdominal bruits   Liver &???Spleen: no organomegaly   Neurologic Exam: neurological assessment grossly intact   Orientation: oriented to time, place and person    Cardiovascular Studies June 20, 2012: A 24-hour Holter monitor revealed normal sinus rhythm, short runs of supraventricular arrhythmias, maximum of 10 beats were noted. There were no episodes of any sustained ventricular or supraventricular arrhythmias noted. June 21, 2012: Echo Doppler ejection fraction 60 percent, normal left ventricular systolic function. Mild mitral and tricuspid regurgitations. PA pressures were 33 mmHg. July 23, 2013, stress thallium: The patient exercised for 11 minute and 54 seconds. During peak exercise, there was 1 mm horizontal ST depression in II, III, aVF, and V5 and V6. However, SPECT images did not show any significant ischemia. Ejection fraction was noted to be 61 percent. June 21, 2014, exercise stress thallium noted to be normal. EKG  today shows sinus bradycardia, rate of 46 beats per minute. First-degree AV block. No significant ST-T changes are noted. No significant changes compared to patient's previous electrocardiogram done on June 19, 2014   October 20, 2015, regadenoson stress Cardiolite done at [facility] revealed no evidence of any significant ischemia and normal ejection fraction at 69%.  October 24, 2015, echo Doppler???shows normal left ventricular function. ???Ejection fraction 65%. 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 ???  ???November 24, 2015, a 30-day looping event monitor showed multiple episodes of atrial fibrillation and flutter with ventricular rates ranging between 80-110 beats per minute. One episode of atrial fibrillation was associated with complaints of heart racing.  May 15, 2018 stress thallium: No evidence of any significant ischemia.  Normal left ventricular function.  May 13, 2018 echo Doppler: Normal left ventricular function ejection fraction 60%.  Normal diastolic function.  Normal left atrial pressure.  Mild mitral regurgitation and mild tricuspid regurgitation.  Aortic valve sclerosis with slightly reduced opening.  August 17, 2018 pacemaker interrogation.  No significant atrial or ventricular arrhythmias.  Device functioning good.  EKG today shows sinus rhythm with one premature atrial contraction.  First-degree AV block.  Diffuse low voltage T waves.  Compared from previous electrocardiogram no significant changes.  Problems Addressed Today  No diagnosis found.    Assessment and Plan     1.  Coronary artery disease status post coronary artery bypass graft surgery.  Patient having no complaints of any angina.  Recent stress test in September 2019 was negative for any significant ischemia.  2.  Paroxysmal atrial fibrillation patient maintaining a AV paced rhythm.  No significant arrhythmias noted on the recent pacemaker check. ???  3.  Sick sinus syndrome status post permanent pacemaker.  Device functioning good.  4. ???Hypertension. ???Blood pressures are pretty well controlled.  5. ???Dyslipidemia. ???Good lipid numbers needs a follow-up lipid check..  6. ???Diabetes mellitus. ???Patient follows his PCP.         Current Medications (including today's revisions)  ??? aspirin EC 81 mg tablet Take 81 mg by mouth daily. Take with food.   ??? atorvastatin (LIPITOR) 40 mg tablet TAKE ONE TABLET BY MOUTH DAILY   ??? candesartan (ATACAND) 32 mg tablet TAKE ONE-HALF TABLET BY MOUTH DAILY   ??? CETIRIZINE HCL (ZYRTEC PO) Take 1 Tab by mouth as Needed.   ??? cholecalciferol (Vitamin D3) (VITAMIN D-3) 1,000 units tablet Take 2,000 Units by mouth daily.     ??? citalopram (CELEXA) 10 mg tablet Take 10 mg by mouth daily.   ??? Coenzyme Q10 150 mg cap Take 1 Cap by mouth daily.   ??? ELIQUIS 5 mg tablet TAKE ONE TABLET BY MOUTH TWICE A DAY   ??? INSULIN GLARGINE,HUM.REC.ANLOG (LANTUS SC) Inject 25 Units under the skin at bedtime daily. ??? metoprolol tartrate (LOPRESSOR) 25 mg tablet Take one tablet by mouth twice daily.   ??? nitroglycerin (NITROSTAT) 0.4 mg tablet Place 1 tab under tongue if having chest pain. May take 1 tab every 5 minutes up to 3 tabs. If chest pain persists after 3 doses, call 911.   ??? NOVOLOG FLEXPEN 100 unit/mL injection PEN 3 Units.   ??? omeprazole DR(+) (PRILOSEC) 20 mg capsule Take 2 Caps by mouth daily. (Patient taking differently: Take 20 mg by mouth daily.)   ??? travoprost(+) (TRAVATAN Z) 0.004 % drop ophthalmic  solution Apply 1 drop to both eyes at bedtime daily.

## 2018-10-26 DIAGNOSIS — E785 Hyperlipidemia, unspecified: Secondary | ICD-10-CM

## 2018-10-26 DIAGNOSIS — Z7901 Long term (current) use of anticoagulants: ICD-10-CM

## 2018-10-26 DIAGNOSIS — Z01810 Encounter for preprocedural cardiovascular examination: Principal | ICD-10-CM

## 2018-10-26 DIAGNOSIS — I495 Sick sinus syndrome: ICD-10-CM

## 2018-10-26 DIAGNOSIS — Z950 Presence of cardiac pacemaker: ICD-10-CM

## 2018-10-26 DIAGNOSIS — I480 Paroxysmal atrial fibrillation: ICD-10-CM

## 2018-10-26 DIAGNOSIS — I2510 Atherosclerotic heart disease of native coronary artery without angina pectoris: Secondary | ICD-10-CM

## 2018-10-26 DIAGNOSIS — Z951 Presence of aortocoronary bypass graft: Secondary | ICD-10-CM

## 2018-10-30 ENCOUNTER — Encounter: Admit: 2018-10-30 | Discharge: 2018-10-30 | Payer: MEDICARE

## 2018-10-30 MED ORDER — ELIQUIS 5 MG PO TAB
ORAL_TABLET | Freq: Two times a day (BID) | 3 refills | Status: AC
Start: 2018-10-30 — End: 2019-03-19

## 2018-11-06 ENCOUNTER — Encounter: Admit: 2018-11-06 | Discharge: 2018-11-06 | Payer: MEDICARE

## 2018-11-06 DIAGNOSIS — E785 Hyperlipidemia, unspecified: Principal | ICD-10-CM

## 2018-11-06 MED ORDER — ATORVASTATIN 40 MG PO TAB
ORAL_TABLET | Freq: Every day | 1 refills | Status: AC
Start: 2018-11-06 — End: 2019-05-18

## 2018-11-15 ENCOUNTER — Ambulatory Visit: Admit: 2018-11-15 | Discharge: 2018-11-15 | Payer: MEDICARE

## 2018-11-20 ENCOUNTER — Encounter: Admit: 2018-11-20 | Discharge: 2018-11-20 | Payer: MEDICARE

## 2018-11-20 MED ORDER — CANDESARTAN 32 MG PO TAB
ORAL_TABLET | Freq: Every day | ORAL | 3 refills | Status: DC
Start: 2018-11-20 — End: 2019-11-26

## 2018-11-21 ENCOUNTER — Encounter: Admit: 2018-11-21 | Discharge: 2018-11-21 | Payer: MEDICARE

## 2018-11-21 DIAGNOSIS — I480 Paroxysmal atrial fibrillation: ICD-10-CM

## 2018-11-21 DIAGNOSIS — Z950 Presence of cardiac pacemaker: ICD-10-CM

## 2018-11-21 DIAGNOSIS — I495 Sick sinus syndrome: Principal | ICD-10-CM

## 2018-12-26 ENCOUNTER — Encounter: Admit: 2018-12-26 | Discharge: 2018-12-26 | Payer: MEDICARE

## 2018-12-26 DIAGNOSIS — I480 Paroxysmal atrial fibrillation: ICD-10-CM

## 2018-12-26 DIAGNOSIS — E785 Hyperlipidemia, unspecified: Principal | ICD-10-CM

## 2018-12-26 LAB — HEMOGLOBIN A1C: Lab: 7.8

## 2018-12-26 LAB — LIPID PROFILE
Lab: 12 mg/dL (ref 65–139)
Lab: 128
Lab: 2.7 10*3/uL — ABNORMAL LOW (ref 3.8–10.8)
Lab: 48 mg/dL — ABNORMAL HIGH (ref 2.1–4.3)
Lab: 62 mg/dL (ref 1.5–2.5)
Lab: 68 mg/dL — ABNORMAL HIGH (ref 2.5–7.0)
Lab: 80 mg/dL — ABNORMAL HIGH (ref 7–25)

## 2018-12-26 LAB — CBC
Lab: 12
Lab: 256
Lab: 33
Lab: 33
Lab: 6.3
Lab: 98

## 2018-12-26 LAB — COMPREHENSIVE METABOLIC PANEL
Lab: 0.3 {cells}/uL (ref <=5)
Lab: 1 10*3/uL (ref 140–400)
Lab: 10 {cells}/uL — ABNORMAL LOW (ref <=5)
Lab: 102 pg (ref 27.0–33.0)
Lab: 135 mg/mg{creat} — ABNORMAL LOW (ref 0.021–0.161)
Lab: 152 fL — ABNORMAL HIGH (ref 7.5–12.5)
Lab: 22 g/dL (ref 32.0–36.0)
Lab: 25 {cells}/uL (ref 0–200)
Lab: 29 % (ref 6–29)
Lab: 29 % — ABNORMAL HIGH (ref 11.0–15.0)
Lab: 4.3 {cells}/uL (ref 200–950)
Lab: 5.1 mg/dL (ref 5–24)
Lab: 7 {cells}/uL (ref <=2)
Lab: 73 %
Lab: 83 {cells}/uL (ref 15–500)

## 2018-12-26 LAB — PHOSPHORUS: Lab: 3.2

## 2018-12-26 LAB — MAGNESIUM: Lab: 2.1

## 2018-12-28 ENCOUNTER — Encounter: Admit: 2018-12-28 | Discharge: 2018-12-28 | Payer: MEDICARE

## 2019-01-01 ENCOUNTER — Encounter: Admit: 2019-01-01 | Discharge: 2019-01-01 | Payer: MEDICARE

## 2019-01-15 ENCOUNTER — Encounter: Admit: 2019-01-15 | Discharge: 2019-01-15

## 2019-02-15 ENCOUNTER — Ambulatory Visit: Admit: 2019-02-15 | Discharge: 2019-02-16

## 2019-02-15 DIAGNOSIS — Z950 Presence of cardiac pacemaker: Secondary | ICD-10-CM

## 2019-02-15 DIAGNOSIS — I480 Paroxysmal atrial fibrillation: Secondary | ICD-10-CM

## 2019-02-16 DIAGNOSIS — I495 Sick sinus syndrome: Secondary | ICD-10-CM

## 2019-02-19 ENCOUNTER — Encounter: Admit: 2019-02-19 | Discharge: 2019-02-19

## 2019-03-18 ENCOUNTER — Encounter: Admit: 2019-03-18 | Discharge: 2019-03-18

## 2019-03-19 MED ORDER — ELIQUIS 5 MG PO TAB
ORAL_TABLET | Freq: Two times a day (BID) | 3 refills | Status: AC
Start: 2019-03-19 — End: ?

## 2019-05-01 ENCOUNTER — Encounter: Admit: 2019-05-01 | Discharge: 2019-05-01 | Payer: MEDICARE

## 2019-05-01 ENCOUNTER — Ambulatory Visit: Admit: 2019-05-01 | Discharge: 2019-05-02 | Payer: MEDICARE

## 2019-05-01 NOTE — Patient Instructions
Thank you for visiting our office today.     Take your medications as ordered.   Check your list with what you have on hand at home.     We recommend follow-up with our office in 6 months.   Please call [phone] this week to schedule the office visit Dr. Murvin Natal ordered.     Please complete the following orders:     Please schedule pacemaker check on your way out today.    Should you have any additional questions or concerns, please message me through MyChart or call the office.    Thank you,    Cardiovascular Medicine Team   Clinic phone: [phone]    If you are seen at a local hospital emergency room before you next visit, we need to know to request records.   Please Mychart message [place] with the name of the hospital you were see at, and why you went in.

## 2019-05-01 NOTE — Progress Notes
Date of Service: 05/01/2019    Marc Nelson is a 73 y.o. male.       HPI     It was a pleasure to see Marc Nelson in my office today for his cardiovascular followup.       Overall, Marc Nelson seems to be doing fairly well since the last time I saw him.  He still continues to have intermittent chest discomfort.  The chest pain seems to be retrosternal, right over his previous bypass site.  His chest pain comes and goes.  Not related to physical activity.  Actually, usually noticed at times of lying down.  His recent stress thallium in September of last year did not reveal any significant ischemia.  His symptoms also seem to be unchanged from last year.     He also complains of general weakness.  Usually, he has to take a little rest or a nap in the afternoon.  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  Again, this has not changed since his last visit with me.     Today's EKG, however, does show that he is having quite frequent premature ventricular contractions.  I believe there is an A-paced, V-paced rhythm.  However, the computer is reading it only as a V-paced rhythm.     His last pacemaker interrogation remotely was on February 15, 2019.  He is not complaining of any palpitations or dizziness or lightheadedness.  No recent symptoms of any syncope or near syncope.     Blood pressures are stable.     Recommendations at this time are as follows:  1. We will set him up for a pacemaker interrogation in the office.   2. Continue with your present medications.  Patient is on Eliquis.    3. If there is any change in your symptoms of shortness of breath or chest discomfort specifically related to physical activity, please let us know.    4. Otherwise, follow up in 6 months.  5. No medication changes were done today.     Total time spent with the patient, including reviewing his EKG and counseling, was approximately 25 minutes in the office.    (RUE:[id number])             Vitals:    05/01/19 1478 05/01/19 0945 BP: (!) 142/78 138/78   BP Source: Arm, Right Upper Arm, Left Lower   Pulse: 78    Weight: 85.6 kg (188 lb 11.2 oz)    Height: 1.778 m (5' 10)    PainSc: Zero      Body mass index is 27.08 kg/m?Marland Kitchen     Past Medical History  Patient Active Problem List    Diagnosis Date Noted   ? Hyperlipemia 06/30/2017   ? Stable angina (HCC) 10/01/2016   ? Sinus node dysfunction (HCC) 05/25/2016     Pacemaker in place.      ? 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 Essential hypertension 05/25/2016   ? Bradycardia 03/24/2016     03/24/2016  dual chamber MDT ppm implanted, ILR removed      ? 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 Dizziness 12/27/2013   ? Paroxysmal A-fib (HCC) 09/03/2011     06/20/2012 Holter monitor: revealed normal sinus rhythm, short runs of supraventricular arrhythmias, maximum of 10 beats were noted. There were no episodes of any sustained ventricular or supraventricular arrhythmias noted.   06/21/2012 Echo: EF 60 %, normal LV systolic function. Mild mitral and  tricuspid regurgitations. PA pressures were 33 mmHg. LA size 5.4 cm.   07/23/2013 Stress thallium: The patient exercised for 11 minute and 54 seconds. During peak exercise, there was 1 mm horizontal ST depression in II, III, aVF, and V5 and V6. However, SPECT images did not show any significant ischemia. Ejection fraction was noted to be 61 percent.   12/27/13 Holter: The predominating rhythm was NS. Rare Supraventricular Ectopic Beats were observed including Atrial Couplets and a Triplet. Rare Ventricular Ectopic Beats were recognized including Rare Couplets. Patient complained of SOB and weakness. No correlation of his symptoms were noted to any underlying arrhythmia.   06/21/2014 Exercise stress thallium: noted to be normal.   05/06/15 Echo: The LV EF is about 60%. The LA is mildly enlarged. Mild MR & mild TR noted. The PA systolic pressure is normal. LA size 4.5 cm.   10/20/2015 Regadenoson stress Cardiolite [facility]): no evidence of any significant ischemia and normal ejection fraction at 69%.  10/24/2015 Echo: normal left ventricular function.? Ejection fraction 65%.? 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 LA size 4.3 cm. ? ?  11/24/2015 30-day looping event monitor: multiple episodes of atrial fibrillation and flutter with ventricular rates ranging between 80-110 beats per minute.? One episode of atrial fibrillation was associated with complaints of heart racing  01/06/16 s/p Medtronic LINQ cardiac memory loop recorder at the anterior chest wall (Dr. Bernette Mayers)      ? Thrombocytopenia (HCC) 08/26/2011   ? AKI (acute kidney injury) (HCC) 08/26/2011   ? CAD (coronary artery disease) 08/23/2011   ? 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 Chest pain 07/30/2011   ? SOB (shortness of breath) 07/15/2011   ? Dyslipidemia 10/22/2009   ? DM II (diabetes mellitus, type II), controlled (HCC) 10/22/2009   ? HTN, goal below 140/90 01/02/2009         Review of Systems   Constitution: Negative.   HENT: Negative.    Eyes: Negative.    Cardiovascular: Negative.    Respiratory: Negative.    Endocrine: Negative.    Hematologic/Lymphatic: Negative.    Skin: Negative.    Gastrointestinal: Negative.    Genitourinary: Negative.    Neurological: Negative.    Psychiatric/Behavioral: Negative.    Allergic/Immunologic: Negative.        Physical Exam  General Appearance: resting comfortably, no acute distress   Neck Veins: neck veins are flat, neck veins are not distended   Thyroid: no nodules, masses, tenderness or enlargement   Chest Inspection: chest is normal in appearance, CABG incision   Respiratory Effort: breathing is unlabored, no respiratory distress   Auscultation/Percussion: lungs clear to auscultation, no rales, rhonchi, or wheezing   PMI: PMI not enlarged or displaced Cardiac Rhythm: regular rhythm and normal rate   Cardiac Auscultation: Normal S1 &?S2, no S3 or S4, no rub   Murmurs: soft holosystolic murmur 2/6 at apex with no radiation   Carotid Arteries: normal carotid upstroke bilaterally, no bruits   Pedal Pulses: normal symmetric pedal pulses   Lower Extremity Edema: no lower extremity edema   Abdominal Exam: soft, non-tender, no masses, bowel sounds normal   Abdominal Aorta: nonpalpable abdominal aorta; no abdominal bruits   Liver &?Spleen: no organomegaly   Neurologic Exam: neurological assessment grossly intact   Orientation: oriented to time, place and person    Cardiovascular Studies  June 20, 2012: A 24-hour Holter monitor revealed normal sinus rhythm, short runs of supraventricular arrhythmias, maximum of 10 beats were noted. There were no episodes of any sustained ventricular or supraventricular arrhythmias noted. June 21, 2012: Echo Doppler ejection fraction 60 percent, normal left ventricular systolic function. Mild mitral and tricuspid regurgitations. PA pressures were 33 mmHg. July 23, 2013, stress thallium: The patient exercised for 11 minute and 54 seconds. During peak exercise, there was 1 mm horizontal ST depression in II, III, aVF, and V5 and V6. However, SPECT images did not show any significant ischemia. Ejection fraction was noted to be 61 percent. June 21, 2014, exercise stress thallium noted to be normal. EKG today shows sinus bradycardia, rate of 46 beats per minute. First-degree AV block. No significant ST-T changes are noted. No significant changes compared to patient's previous electrocardiogram done on June 19, 2014   October 20, 2015, regadenoson stress Cardiolite done at [facility] revealed no evidence of any significant ischemia and normal ejection fraction at 69%.  October 24, 2015, echo Doppler?shows normal left ventricular function. ?Ejection fraction 65%. ?Grade 2 moderate left ventricular diastolic dysfunction, elevated left?atrial pressure, trace-mild tricuspid regurgitation, PA pressure is 33 mmHg, ascending aorta is mildly dilated at the sinus of Valsalva. ?  ?November 24, 2015, a 30-day looping event monitor showed multiple episodes of atrial fibrillation and flutter with ventricular rates ranging between 80-110 beats per minute. One episode of atrial fibrillation was associated with complaints of heart racing.  May 15, 2018 stress thallium: No evidence of any significant ischemia.  Normal left ventricular function.  May 13, 2018 echo Doppler: Normal left ventricular function ejection fraction 60%.  Normal diastolic function.  Normal left atrial pressure.  Mild mitral regurgitation and mild tricuspid regurgitation.  Aortic valve sclerosis with slightly reduced opening.  February 15, 2019 pacemaker interrogation.  This is a remote check.  Device functioning good.  No evidence of any significant atrial or ventricular arrhythmias.  EKG today.  AV paced rhythm.  Frequent premature ventricular contractions.  Would recommend a complete pacemaker check in the office.  Problems Addressed Today  Encounter Diagnoses   Name Primary?   ? Coronary artery disease involving native heart, angina presence unspecified, unspecified vessel or lesion type Yes   ? S/P CABG (coronary artery bypass graft)    ? Paroxysmal A-fib (HCC)    ? Sinus node dysfunction (HCC)    ? Cardiac pacemaker in situ    ? Essential hypertension    ? Dyslipidemia        Assessment and Plan     1. ?Coronary artery disease status post coronary artery bypass graft surgery.  Chest pain patient describes his probably more musculoskeletal.  Recent stress thallium did not reveal any significant ischemia.  2. ?Paroxysmal atrial fibrillation patient maintaining a AV paced rhythm.  There are frequent PVCs noted today.  We will interrogate the pacemaker.  3. 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 ?Device functioning good. 4. ?Hypertension. ?Blood pressures are reasonably well controlled.  5. ?Dyslipidemia. ?Good lipid numbers.  6. ?Diabetes mellitus. ?Patient follows his PCP.         Current Medications (including today's revisions)  ? aspirin EC 81 mg tablet Take 81 mg by mouth daily. Take with food.   ? atorvastatin (LIPITOR) 40 mg tablet TAKE ONE TABLET BY MOUTH DAILY   ? CALCIUM PO Take 400 mg by mouth daily.   ? candesartan (ATACAND) 32 mg tablet TAKE ONE-HALF TABLET BY MOUTH  DAILY   ? CETIRIZINE HCL (ZYRTEC PO) Take 1 Tab by mouth as Needed.   ? cholecalciferol (Vitamin D3) (VITAMIN D-3) 1,000 units tablet Take 2,000 Units by mouth daily.     ? citalopram (CELEXA) 10 mg tablet Take 10 mg by mouth daily.   ? Coenzyme Q10 150 mg cap Take 1 Cap by mouth daily. (Patient taking differently: Take 200 mg by mouth daily.)   ? dorzolamide (TRUSOPT) 2 % ophthalmic solution Apply 1 drop to both eyes daily.   ? ELIQUIS 5 mg tablet TAKE ONE TABLET BY MOUTH TWICE A DAY   ? GLUCAGON (HCL) EMERGENCY KIT IJ Inject  to area(s) as directed.   ? INSULIN GLARGINE,HUM.REC.ANLOG (LANTUS SC) Inject 25 Units under the skin at bedtime daily.   ? Magnesium 250 mg tab Take 1 tablet by mouth daily.   ? metoprolol tartrate (LOPRESSOR) 25 mg tablet Take one tablet by mouth twice daily.   ? nitroglycerin (NITROSTAT) 0.4 mg tablet Place 1 tab under tongue if having chest pain. May take 1 tab every 5 minutes up to 3 tabs. If chest pain persists after 3 doses, call 911.   ? NOVOLOG FLEXPEN 100 unit/mL injection PEN 3 Units.   ? omeprazole DR(+) (PRILOSEC) 20 mg capsule Take 2 Caps by mouth daily. (Patient taking differently: Take 20 mg by mouth daily.)   ? travoprost(+) (TRAVATAN Z) 0.004 % drop ophthalmic solution Apply 1 drop to both eyes at bedtime daily.

## 2019-05-18 ENCOUNTER — Encounter: Admit: 2019-05-18 | Discharge: 2019-05-18 | Payer: MEDICARE

## 2019-05-18 DIAGNOSIS — E785 Hyperlipidemia, unspecified: Secondary | ICD-10-CM

## 2019-05-18 MED ORDER — ATORVASTATIN 40 MG PO TAB
ORAL_TABLET | Freq: Every day | 2 refills | Status: DC
Start: 2019-05-18 — End: 2019-11-02

## 2019-06-26 ENCOUNTER — Ambulatory Visit: Admit: 2019-06-26 | Discharge: 2019-06-26 | Payer: MEDICARE

## 2019-06-26 ENCOUNTER — Encounter: Admit: 2019-06-26 | Discharge: 2019-06-26 | Payer: MEDICARE

## 2019-06-26 DIAGNOSIS — Z950 Presence of cardiac pacemaker: Secondary | ICD-10-CM

## 2019-06-26 DIAGNOSIS — I495 Sick sinus syndrome: Secondary | ICD-10-CM

## 2019-07-01 ENCOUNTER — Encounter: Admit: 2019-07-01 | Discharge: 2019-07-01 | Payer: MEDICARE

## 2019-07-02 MED ORDER — METOPROLOL TARTRATE 25 MG PO TAB
ORAL_TABLET | Freq: Two times a day (BID) | ORAL | 2 refills | 90.00000 days | Status: AC
Start: 2019-07-02 — End: ?

## 2019-07-12 ENCOUNTER — Encounter: Admit: 2019-07-12 | Discharge: 2019-07-12 | Payer: MEDICARE

## 2019-07-12 DIAGNOSIS — R0789 Other chest pain: Secondary | ICD-10-CM

## 2019-07-16 MED ORDER — NITROGLYCERIN 0.4 MG SL SUBL
ORAL_TABLET | SUBLINGUAL | 0 refills | 9.00000 days | Status: AC
Start: 2019-07-16 — End: ?

## 2019-09-26 ENCOUNTER — Ambulatory Visit: Admit: 2019-09-26 | Discharge: 2019-09-26 | Payer: MEDICARE

## 2019-09-26 DIAGNOSIS — I480 Paroxysmal atrial fibrillation: Secondary | ICD-10-CM

## 2019-09-26 DIAGNOSIS — Z950 Presence of cardiac pacemaker: Secondary | ICD-10-CM

## 2019-09-26 DIAGNOSIS — I495 Sick sinus syndrome: Secondary | ICD-10-CM

## 2019-10-15 ENCOUNTER — Encounter: Admit: 2019-10-15 | Discharge: 2019-10-15 | Payer: MEDICARE

## 2019-10-31 ENCOUNTER — Encounter: Admit: 2019-10-31 | Discharge: 2019-10-31 | Payer: MEDICARE

## 2019-10-31 ENCOUNTER — Ambulatory Visit: Admit: 2019-10-31 | Discharge: 2019-10-31 | Payer: MEDICARE

## 2019-10-31 DIAGNOSIS — I2510 Atherosclerotic heart disease of native coronary artery without angina pectoris: Secondary | ICD-10-CM

## 2019-10-31 DIAGNOSIS — I10 Essential (primary) hypertension: Secondary | ICD-10-CM

## 2019-10-31 DIAGNOSIS — I495 Sick sinus syndrome: Secondary | ICD-10-CM

## 2019-10-31 DIAGNOSIS — R0600 Dyspnea, unspecified: Secondary | ICD-10-CM

## 2019-10-31 DIAGNOSIS — I208 Other forms of angina pectoris: Secondary | ICD-10-CM

## 2019-10-31 DIAGNOSIS — T50905A Adverse effect of unspecified drugs, medicaments and biological substances, initial encounter: Secondary | ICD-10-CM

## 2019-10-31 DIAGNOSIS — I480 Paroxysmal atrial fibrillation: Secondary | ICD-10-CM

## 2019-10-31 DIAGNOSIS — E119 Type 2 diabetes mellitus without complications: Secondary | ICD-10-CM

## 2019-10-31 DIAGNOSIS — Z951 Presence of aortocoronary bypass graft: Secondary | ICD-10-CM

## 2019-10-31 DIAGNOSIS — R9439 Abnormal result of other cardiovascular function study: Secondary | ICD-10-CM

## 2019-10-31 DIAGNOSIS — E785 Hyperlipidemia, unspecified: Secondary | ICD-10-CM

## 2019-10-31 DIAGNOSIS — Z950 Presence of cardiac pacemaker: Secondary | ICD-10-CM

## 2019-10-31 NOTE — Progress Notes
Patient: Marc Nelson, DOB: 02/04/1946    Requesting most recent blood work to be faxed to Dr. Bufford Buttner.    Please fax to  Attention: Dr. Bufford Buttner at the [facility]    Fax number [phone]  Thank you.

## 2019-10-31 NOTE — Patient Instructions
Thank you for visiting our office today.   We recommend follow-up with our office in 6 months.   Please call [phone] in 1 month to schedule the office visit Dr. Bufford Buttner ordered.     Please complete the following orders:     Please schedule the echocardiogram and stress test ordered by Dr. Bufford Buttner today before leaving. Call [phone] to schedule if unable to schedule today.    Take your medications as ordered.   Check your list with what you have on hand at home.   Should you have any additional questions or concerns, please message me through MyChart or call the office.    Cardiovascular Medicine Team   Clinic phone: [phone]         [facility] Nuclear Stress Test Instructions    PLEASE REPORT TO:    ____[facility] ([address], [address], [place], [place]) - [phone]   ____[facility] ([address], [address], [place], [place]) - [phone]    ____[facility] ([address] [place], [place]) - [phone]    ____[facility] ([address], [address], [place], [place]) - [phone]   ____[facility] ([address] [place], [place]) - [phone]     [facility] Main Phone Number: [phone]     Date of Test  ____________  at ____________  for ________________________    Are you able to raise your arm up by your head for about 20 minutes? yes  Can you lie on your back for approximately 20 minutes with minimal movement? yes    The Thallium evaluation has two parts -- two nuclear scans.  The first scan is done in the morning and the second three to four hours later.   Wear comfortable clothing. Shorts or pants. (No dresses or skirts please).  Bring or wear sneakers/walking shoes if you are walking on [place].  Please let the nuclear technologists know if you plan on flying after the test.    NO CAFFEINE 24 HOURS PRIOR TO TEST. Examples: coffee, tea, decaf drinks, cola, chocolate.     DO NOT EAT OR DRINK THE MORNING OF YOUR TEST unless otherwise instructed. (You may have a glass of water).    If you are a diabetic, if insulin dependent: please take one third of your insulin with two pieces of dry toast and a small juice). Bring insulin and medication with you to the test.     ___ TAKE MORNING MEDICATIONS WITH A GLASS OF WATER PRIOR TO TEST.     HOLD THE FOLLOWING MEDICATIONS AS INDICATED BELOW:      ? Insulin on the morning before the procedure.   ? All over the counter vitamins and supplements the morning before the procedure.       WHAT TO DO BETWEEN THE FIRST TWO THALLIUM SCANS:  1. No strenuous exercise should be performed during this time.  2. A light lunch is permissible. The technologist will give you a list of appropriate foods.  3. Please return 15 minutes prior to the schedule of your second scan. Our nuclear technologist will tell you exactly what time to return.  4. Please do not use tobacco products in between scans.  5. After the first scan is completed, you may resume usual medications.     TEST FINDINGS:  You will receive the results of the test within 7 business days of its completion by telephone, unless arranged differently at the time of the procedure.  If you have any questions concerning your thallium test or if you do not hear from your CVM physician/or nurse within 7 business days, please call the appropriate office checked above.      Instructions given by Hayden Rasmussen, RN

## 2019-11-02 ENCOUNTER — Encounter: Admit: 2019-11-02 | Discharge: 2019-11-02 | Payer: MEDICARE

## 2019-11-02 DIAGNOSIS — E785 Hyperlipidemia, unspecified: Secondary | ICD-10-CM

## 2019-11-02 MED ORDER — ATORVASTATIN 80 MG PO TAB
80 mg | ORAL_TABLET | Freq: Every day | ORAL | 3 refills | Status: AC
Start: 2019-11-02 — End: ?

## 2019-11-21 ENCOUNTER — Encounter: Admit: 2019-11-21 | Discharge: 2019-11-21 | Payer: MEDICARE

## 2019-11-21 NOTE — Telephone Encounter
11/21/2019 10:08 AM   Spoke to pt. Wife. Reminder her that pt. Can not have any Caffeine between now and the procedure tomorrow. Also that he can eat until midnight. He needs to hold his Insulin in the am. He can have plain toast and OJ if needed in the am.  Let her know that he can continue to drink water. Also explained that this  Was a 2 part test with a 3 hour space in between.

## 2019-11-22 ENCOUNTER — Encounter: Admit: 2019-11-22 | Discharge: 2019-11-22 | Payer: MEDICARE

## 2019-11-22 ENCOUNTER — Ambulatory Visit: Admit: 2019-11-22 | Discharge: 2019-11-22 | Payer: MEDICARE

## 2019-11-22 MED ORDER — AMINOPHYLLINE 500 MG/20 ML IV SOLN
50 mg | INTRAVENOUS | 0 refills | Status: AC | PRN
Start: 2019-11-22 — End: ?
  Administered 2019-11-22 (×2): 50 mg via INTRAVENOUS

## 2019-11-22 MED ORDER — SODIUM CHLORIDE 0.9 % IV SOLP
250 mL | INTRAVENOUS | 0 refills | Status: AC | PRN
Start: 2019-11-22 — End: ?

## 2019-11-22 MED ORDER — REGADENOSON 0.4 MG/5 ML IV SYRG
.4 mg | Freq: Once | INTRAVENOUS | 0 refills | Status: CP
Start: 2019-11-22 — End: ?
  Administered 2019-11-22: 14:00:00 0.4 mg via INTRAVENOUS

## 2019-11-22 MED ORDER — NITROGLYCERIN 0.4 MG SL SUBL
.4 mg | SUBLINGUAL | 0 refills | Status: DC | PRN
Start: 2019-11-22 — End: 2019-11-27
  Administered 2019-11-22: 14:00:00 0.4 mg via SUBLINGUAL

## 2019-11-22 MED ORDER — ALBUTEROL SULFATE 90 MCG/ACTUATION IN HFAA
2 | RESPIRATORY_TRACT | 0 refills | Status: DC | PRN
Start: 2019-11-22 — End: 2019-11-27

## 2019-11-22 MED ORDER — EUCALYPTUS-MENTHOL MM LOZG
1 | Freq: Once | ORAL | 0 refills | Status: AC | PRN
Start: 2019-11-22 — End: ?

## 2019-11-23 ENCOUNTER — Encounter: Admit: 2019-11-23 | Discharge: 2019-11-23 | Payer: MEDICARE

## 2019-11-23 NOTE — Telephone Encounter
Myriam Jacobson, MD  P Cvm Nurse Gen Card Team [facility]            His stress test is normal.   Echo revealed normal LV function   No significant valvular disease.   In comparison to prior study dated 05-15-2018: No significant interval change

## 2019-11-24 ENCOUNTER — Encounter: Admit: 2019-11-24 | Discharge: 2019-11-24 | Payer: MEDICARE

## 2019-11-26 MED ORDER — CANDESARTAN 32 MG PO TAB
ORAL_TABLET | Freq: Every day | ORAL | 3 refills | Status: AC
Start: 2019-11-26 — End: ?

## 2020-02-21 ENCOUNTER — Encounter: Admit: 2020-02-21 | Discharge: 2020-02-21 | Payer: MEDICARE

## 2020-02-21 NOTE — Telephone Encounter
Patient was scheduled for a Medtronic Carelink on May 12 that has not been received. Patient was instructed to send a manual transmission. Instructed if the transmitter does not appear to be working properly, they need to contact the device company directly. Patient was provided with that contact number. Requested the patient send us a MyChart message or contact our device nurses at [phone] to let us know after they have sent their transmission. Patient verbalized understanding.

## 2020-02-22 ENCOUNTER — Encounter: Admit: 2020-02-22 | Discharge: 2020-02-22 | Payer: MEDICARE

## 2020-02-26 ENCOUNTER — Encounter: Admit: 2020-02-26 | Discharge: 2020-02-26 | Payer: MEDICARE

## 2020-02-26 DIAGNOSIS — I480 Paroxysmal atrial fibrillation: Secondary | ICD-10-CM

## 2020-02-26 DIAGNOSIS — I2510 Atherosclerotic heart disease of native coronary artery without angina pectoris: Secondary | ICD-10-CM

## 2020-02-26 NOTE — Progress Notes
Put orders in for lab and mailed to the pt.        RE: NSVT with PPM  Received: 4 days ago  Message Contents   Myriam Jacobson, MD  Vernie Shanks, RN    Thanks.      ?Previous Messages  ?  ----- Message -----   From: Vernie Shanks, RN   Sent: 02/22/2020 ? 3:21 PM CDT   To: Myriam Jacobson, MD   Subject: RE: NSVT with PPM ? ? ? ? ? ? ? ? ? ? ? ? ? ?     This event happened back in February. You saw the pt in March. I will still order an updated BMP and Mag.   ----- Message -----   From: Myriam Jacobson, MD   Sent: 02/22/2020 ? 3:10 PM CDT   To: Vernie Shanks, RN   Subject: RE: NSVT with PPM ? ? ? ? ? ? ? ? ? ? ? ? ? ?     Check with patient any change in symptoms. Recent stress test and echo was good. Check BMP and Mg.   Thanks   ----- Message -----   From: Vernie Shanks, RN   Sent: 02/22/2020 ? 2:10 PM CDT   To: Myriam Jacobson, MD   Subject: FW: NSVT with PPM ? ? ? ? ? ? ? ? ? ? ? ? ? ?       ----- Message -----   From: Algis Liming   Sent: 02/22/2020 ?12:48 PM CDT   To: Cvm Nurse Gen Card Team Sheryle Hail   Subject: NSVT with PPM ? ? ? ? ? ? ? ? ? ? ? ? ? ? ? ?     Recent remote transmission shows 1 NSVT episode that occurred on 10/13/19 lasting ~ 6 beats. Avg V rate: 167 bpm. There has also been an increase in PVC's. 223.3 PVCs noted per hour. Please follow up with the patient as needed.     Thanks,   Morrie Sheldon

## 2020-03-04 ENCOUNTER — Encounter: Admit: 2020-03-04 | Discharge: 2020-03-04 | Payer: MEDICARE

## 2020-03-07 ENCOUNTER — Encounter: Admit: 2020-03-07 | Discharge: 2020-03-07 | Payer: MEDICARE

## 2020-03-07 NOTE — Progress Notes
03/07/2020 11:17 AM     Fax rec'd from [facility] requesting last progress note on pt be faxed. Document faxed to [phone] per request.

## 2020-03-25 ENCOUNTER — Encounter: Admit: 2020-03-25 | Discharge: 2020-03-25 | Payer: MEDICARE

## 2020-03-27 ENCOUNTER — Encounter: Admit: 2020-03-27 | Discharge: 2020-03-27 | Payer: MEDICARE

## 2020-03-27 NOTE — Progress Notes
03/27/2020 4:32 PM   Received fax from [facility]. 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 Jearl Klinefelter. Facility is requesting clearance and guidance on when to hold his eliquis and aspirin pre-operatively.   Office visit scheduled with [facility] on 9/9 8:30AM.    Fax number for [facility] - [phone]

## 2020-03-31 ENCOUNTER — Encounter: Admit: 2020-03-31 | Discharge: 2020-03-31 | Payer: MEDICARE

## 2020-03-31 NOTE — Progress Notes
Lab orders mailed to pt.

## 2020-04-08 ENCOUNTER — Encounter: Admit: 2020-04-08 | Discharge: 2020-04-08 | Payer: MEDICARE

## 2020-04-08 DIAGNOSIS — I480 Paroxysmal atrial fibrillation: Secondary | ICD-10-CM

## 2020-04-08 DIAGNOSIS — I2510 Atherosclerotic heart disease of native coronary artery without angina pectoris: Secondary | ICD-10-CM

## 2020-04-08 LAB — BASIC METABOLIC PANEL
Lab: 10 % (ref 4–12)
Lab: 10 10*3/uL (ref 1.8–7.0)
Lab: 103 U/L — AB (ref 70–100)
Lab: 111 % (ref 11–15)
Lab: 142 pg (ref 26–34)
Lab: 21 K/UL (ref 150–400)
Lab: 59 % — AB (ref >60)
Lab: 71 % (ref >60)

## 2020-04-08 LAB — MAGNESIUM: Lab: 2.1 mg/dL — ABNORMAL HIGH (ref <100)

## 2020-04-09 ENCOUNTER — Encounter: Admit: 2020-04-09 | Discharge: 2020-04-09 | Payer: MEDICARE

## 2020-04-17 ENCOUNTER — Encounter: Admit: 2020-04-17 | Discharge: 2020-04-17 | Payer: MEDICARE

## 2020-04-28 ENCOUNTER — Encounter: Admit: 2020-04-28 | Discharge: 2020-04-28 | Payer: MEDICARE

## 2020-04-28 ENCOUNTER — Ambulatory Visit: Admit: 2020-04-28 | Discharge: 2020-04-29 | Payer: MEDICARE

## 2020-04-28 DIAGNOSIS — E785 Hyperlipidemia, unspecified: Secondary | ICD-10-CM

## 2020-04-28 DIAGNOSIS — T50905A Adverse effect of unspecified drugs, medicaments and biological substances, initial encounter: Secondary | ICD-10-CM

## 2020-04-28 DIAGNOSIS — I480 Paroxysmal atrial fibrillation: Secondary | ICD-10-CM

## 2020-04-28 DIAGNOSIS — I10 Essential (primary) hypertension: Secondary | ICD-10-CM

## 2020-04-28 DIAGNOSIS — I208 Other forms of angina pectoris: Secondary | ICD-10-CM

## 2020-04-28 DIAGNOSIS — R9439 Abnormal result of other cardiovascular function study: Secondary | ICD-10-CM

## 2020-04-28 DIAGNOSIS — I2510 Atherosclerotic heart disease of native coronary artery without angina pectoris: Secondary | ICD-10-CM

## 2020-04-28 DIAGNOSIS — Z951 Presence of aortocoronary bypass graft: Secondary | ICD-10-CM

## 2020-04-28 DIAGNOSIS — E119 Type 2 diabetes mellitus without complications: Secondary | ICD-10-CM

## 2020-04-28 DIAGNOSIS — I495 Sick sinus syndrome: Secondary | ICD-10-CM

## 2020-04-28 NOTE — Progress Notes
04/28/2020 10:53 AM    Pt. Reports that he is having his Knee Surgery done at [facility]. Phone [phone]. Fax is [phone].

## 2020-04-28 NOTE — Progress Notes
Date of Service: 04/28/2020    Marc Nelson is a 74 y.o. male.       HPI     It was a pleasure to see [name] in my office today for his cardiovascular followup as well as his preop clearance for his right knee surgery.       [name] has been doing fairly good since the last time I have seen him.  He has had no chest pains or any significant shortness of breath.  He feels chronically tired, which is unchanged from his last followup with me.       His blood pressures are stable.  His heart rate is stable.  His lungs are clear.  Extremities show no edema.  Heart sounds are regular.     His pacemaker interrogation continues to show frequent PVCs.  However, there has been no episode of any nonsustained VT since February.     In April, we did a stress thallium which did not reveal any significant ischemia and an echo Doppler which revealed a normal LV function with mild aortic stenosis.       He is going for his right knee surgery at [facility].     Clinically, hemodynamically, he is doing well.  I think he would be a low cardiac risk for his planned surgery. He is on Eliquis for his history of paroxysmal atrial fibrillation.  He is maintaining an A paced rhythm.  I would suggest that his Eliquis can be held 2 days prior to his surgery and resumed as soon as possible after surgery.     If I can be of any further assistance, please do not hesitate to give me a call.       After dictating today's visit, I will also dictate a letter to his surgeon for his preop clearance.     I spent a total of 25 minutes with the patient in the clinic. This is not inclusive of the preop clearance letter which I have dictated.    (AVW:[id number])             Vitals:    04/28/20 1019 04/28/20 1026   BP: 110/62 108/64   BP Source: Arm, Right Upper Arm, Left Upper   Patient Position: Sitting Sitting   Pulse: 72    SpO2: 97%    Weight: 85.8 kg (189 lb 1.6 oz)    Height: 1.778 m (5' 10)    PainSc: Zero      Body mass index is 27.13 kg/m?Marland Kitchen     Past Medical History  Patient Active Problem List    Diagnosis Date Noted   ? Hyperlipemia 06/30/2017   ? Stable angina (HCC) 10/01/2016   ? Sinus node dysfunction (HCC) 05/25/2016     Pacemaker in place.      ? 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 Essential hypertension 05/25/2016   ? Bradycardia 03/24/2016     03/24/2016  dual chamber MDT ppm implanted, ILR removed      ? 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 Dizziness 12/27/2013   ? Paroxysmal A-fib (HCC) 09/03/2011     06/20/2012 Holter monitor: revealed normal sinus rhythm, short runs of supraventricular arrhythmias, maximum of 10 beats were noted. There were no episodes of any sustained ventricular or supraventricular arrhythmias noted.   06/21/2012 Echo: EF 60 %, normal LV systolic function. Mild mitral and tricuspid regurgitations. PA pressures were 33 mmHg. LA size 5.4 cm.  07/23/2013 Stress thallium: The patient exercised for 11 minute and 54 seconds. During peak exercise, there was 1 mm horizontal ST depression in II, III, aVF, and V5 and V6. However, SPECT images did not show any significant ischemia. Ejection fraction was noted to be 61 percent.   12/27/13 Holter: The predominating rhythm was NS. Rare Supraventricular Ectopic Beats were observed including Atrial Couplets and a Triplet. Rare Ventricular Ectopic Beats were recognized including Rare Couplets. Patient complained of SOB and weakness. No correlation of his symptoms were noted to any underlying arrhythmia.   06/21/2014 Exercise stress thallium: noted to be normal.   05/06/15 Echo: The LV EF is about 60%. The LA is mildly enlarged. Mild MR & mild TR noted. The PA systolic pressure is normal. LA size 4.5 cm.   10/20/2015 Regadenoson stress Cardiolite [facility]): no evidence of any significant ischemia and normal ejection fraction at 69%.  10/24/2015 Echo: normal left ventricular function.? Ejection fraction 65%.? 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 LA size 4.3 cm. ? ?  11/24/2015 30-day looping event monitor: multiple episodes of atrial fibrillation and flutter with ventricular rates ranging between 80-110 beats per minute.? One episode of atrial fibrillation was associated with complaints of heart racing  01/06/16 s/p Medtronic LINQ cardiac memory loop recorder at the anterior chest wall (Dr. Bernette Mayers)      ? Thrombocytopenia (HCC) 08/26/2011   ? AKI (acute kidney injury) (HCC) 08/26/2011   ? CAD (coronary artery disease) 08/23/2011   ? 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 Chest pain 07/30/2011   ? SOB (shortness of breath) 07/15/2011   ? Dyslipidemia 10/22/2009   ? DM II (diabetes mellitus, type II), controlled (HCC) 10/22/2009   ? HTN, goal below 140/90 01/02/2009         Review of Systems   Constitutional: Negative.   HENT: Negative.    Eyes: Negative.    Cardiovascular: Positive for dyspnea on exertion.   Respiratory: Negative.    Endocrine: Negative.    Hematologic/Lymphatic: Negative.    Skin: Negative.    Gastrointestinal: Negative.    Genitourinary: Negative.    Neurological: Negative.    Psychiatric/Behavioral: Negative.    Allergic/Immunologic: Negative.        Physical Exam  General Appearance: resting comfortably, no acute distress   Neck Veins: neck veins are not distended   Thyroid: no nodules, masses, tenderness or enlargement   Chest Inspection: chest is normal in appearance, CABG incision   Respiratory Effort: breathing is unlabored, no respiratory distress   Auscultation/Percussion: lungs clear to auscultation, no rales, rhonchi, or wheezing   PMI: PMI not enlarged or displaced   Cardiac Rhythm: regular rhythm and normal rate   Cardiac Auscultation: Normal S1 &?S2, no S3 or S4, no rub   Murmurs: soft holosystolic murmur 2/6 at apex with no radiation   Carotid Arteries: normal carotid upstroke bilaterally, no bruits   Pedal Pulses: normal symmetric pedal pulses   Lower Extremity Edema: no lower extremity edema   Abdominal Exam: soft, non-tender, no masses, bowel sounds normal   Abdominal Aorta: nonpalpable abdominal aorta; no abdominal bruits   Liver &?Spleen: no organomegaly   Neurologic Exam: neurological assessment grossly intact   Orientation: oriented to time, place and person    Cardiovascular Studies  June 20, 2012: A 24-hour Holter monitor revealed normal sinus rhythm, short  runs of supraventricular arrhythmias, maximum of 10 beats were noted. There were no episodes of any sustained ventricular or supraventricular arrhythmias noted. June 21, 2012: Echo Doppler ejection fraction 60 percent, normal left ventricular systolic function. Mild mitral and tricuspid regurgitations. PA pressures were 33 mmHg. July 23, 2013, stress thallium: The patient exercised for 11 minute and 54 seconds. During peak exercise, there was 1 mm horizontal ST depression in II, III, aVF, and V5 and V6. However, SPECT images did not show any significant ischemia. Ejection fraction was noted to be 61 percent. June 21, 2014, exercise stress thallium noted to be normal. EKG today shows sinus bradycardia, rate of 46 beats per minute. First-degree AV block. No significant ST-T changes are noted. No significant changes compared to patient's previous electrocardiogram done on June 19, 2014   October 20, 2015, regadenoson stress Cardiolite done at [facility] revealed no evidence of any significant ischemia and normal ejection fraction at 69%.  October 24, 2015, echo Doppler?shows normal left ventricular function. ?Ejection fraction 65%. ?Grade 2 moderate left ventricular diastolic dysfunction, elevated left?atrial pressure, trace-mild tricuspid regurgitation, PA pressure is 33 mmHg, ascending aorta is mildly dilated at the sinus of Valsalva. ?  November 24, 2015, a 30-day looping event monitor showed multiple episodes of atrial fibrillation and flutter with ventricular rates ranging between 80-110 beats per minute. One episode of atrial fibrillation was associated with complaints of heart racing.  May 15, 2018 stress thallium: No evidence of any significant ischemia. ?Normal left ventricular function.  May 13, 2018 echo Doppler: Normal left ventricular function ejection fraction 60%. ?Normal diastolic function. ?Normal left atrial pressure. ?Mild mitral regurgitation and mild tricuspid regurgitation. ?Aortic valve sclerosis with slightly reduced opening.  November 22, 2019 echocardiogram: Normal left ventricular function.  Mild aortic stenosis.  November 22, 2019 stress thallium: No evidence of any significant ischemia.  March 13, 2020 pacemaker interrogation: Device functioning good.  Frequent premature ventricular contractions are noted.  EKG today shows a paced V sensed rhythm.  There is supraventricular bigeminy.  There is first-degree AV block.  There are no significant changes noted as compared to the EKG done on October 31, 2019.      Problems Addressed Today  No diagnosis found.    Assessment and Plan     1. ?Coronary artery disease status post coronary artery bypass graft surgery.  Denies any chest discomfort.  Recent stress thallium did not reveal any significant ischemia.  2. ?Paroxysmal atrial fibrillation patient maintaining a AV paced rhythm.  EKG is as noted above.  3. 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 ?Device functioning good.  4. ?Hypertension. ?Blood pressures are very well controlled.  5. ?Dyslipidemia. ?Good lipid numbers.  6. ?Diabetes mellitus. ?Patient follows his PCP.  7.  PVCs noted on his pacemaker interrogation.  Patient remains asymptomatic.  Recent stress thallium showed no evidence of ischemia and an echo Doppler revealed a normal LV function.  If the frequent PVCs continue may consider putting a Holter monitor to see if total number of PVCs in 24 hours.  8.  Preop clearance for right knee surgery.  Letter has been dictated today.  Dictation number is [id number]         Current Medications (including today's revisions)  ? aspirin EC 81 mg tablet Take 81 mg by mouth daily. Take with food.   ? atorvastatin (LIPITOR) 80 mg tablet Take one tablet by mouth daily.   ? CALCIUM PO Take 400 mg by mouth daily.   ?  candesartan (ATACAND) 32 mg tablet TAKE ONE-HALF TABLET BY MOUTH DAILY   ? CETIRIZINE HCL (ZYRTEC PO) Take 1 Tab by mouth as Needed.   ? cholecalciferol (Vitamin D3) (VITAMIN D-3) 1,000 units tablet Take 2,000 Units by mouth daily.     ? citalopram (CELEXA) 10 mg tablet Take 10 mg by mouth daily.   ? Coenzyme Q10 150 mg cap Take 1 Cap by mouth daily. (Patient taking differently: Take 200 mg by mouth daily.)   ? cyanocobalamin (VITAMIN B-12) 1,000 mcg tablet Take 1,000 mcg by mouth daily.   ? dorzolamide (TRUSOPT) 2 % ophthalmic solution Apply 1 drop to both eyes daily.   ? ELIQUIS 5 mg tablet TAKE ONE TABLET BY MOUTH TWICE A DAY   ? INSULIN GLARGINE,HUM.REC.ANLOG (LANTUS SC) Inject 25 Units under the skin at bedtime daily.   ? Magnesium 250 mg tab Take 1 tablet by mouth daily.   ? metoprolol tartrate (LOPRESSOR) 25 mg tablet TAKE ONE TABLET BY MOUTH TWICE A DAY   ? nitroglycerin (NITROSTAT) 0.4 mg tablet DISSOLVE 1 TAB UNDER TONGUE FOR CHEST PAIN - IF PAIN REMAINS AFTER 5 MIN, CALL 911 AND REPEAT DOSE. MAX 3 TABS IN 15 MINUTES   ? NOVOLOG FLEXPEN 100 unit/mL injection PEN 3 Units.   ? omeprazole DR(+) (PRILOSEC) 20 mg capsule Take 2 Caps by mouth daily. (Patient taking differently: Take 20 mg by mouth daily.)   ? travoprost(+) (TRAVATAN Z) 0.004 % drop ophthalmic solution Apply 1 drop to both eyes at bedtime daily.

## 2020-04-29 DIAGNOSIS — Z950 Presence of cardiac pacemaker: Secondary | ICD-10-CM

## 2020-04-30 ENCOUNTER — Encounter: Admit: 2020-04-30 | Discharge: 2020-04-30 | Payer: MEDICARE

## 2020-05-20 ENCOUNTER — Encounter: Admit: 2020-05-20 | Discharge: 2020-05-20 | Payer: MEDICARE

## 2020-06-02 ENCOUNTER — Encounter: Admit: 2020-06-02 | Discharge: 2020-06-02 | Payer: MEDICARE

## 2020-06-02 NOTE — Telephone Encounter
Patient was scheduled for a Medtronic Carelink on 05/23/20 that has not been received.     Patient was instructed to send a manual transmission. Instructed if the transmitter does not appear to be working properly, they need to contact the device company directly. Patient was provided with that contact number. Requested the patient send [place] a MyChart message or contact our device nurses at [phone] to let us know after they have sent their transmission.  Called preferred Phone number [phone],  Spoke with Adria Devon verbalized understanding.    CDJ

## 2020-06-03 ENCOUNTER — Encounter: Admit: 2020-06-03 | Discharge: 2020-06-03 | Payer: MEDICARE

## 2020-06-04 ENCOUNTER — Encounter: Admit: 2020-06-04 | Discharge: 2020-06-04 | Payer: MEDICARE

## 2020-08-28 ENCOUNTER — Encounter: Admit: 2020-08-28 | Discharge: 2020-08-28 | Payer: MEDICARE

## 2020-08-28 NOTE — Progress Notes
08/28/2020 4:20 PM    Received fax from [employer] LPN W/ [facility]. Pt. Needing Cardiac Clearance to have Left Knee Arthroplasty on March 22nd, 2022 with Dr. Jackquline Berlin, D.O.. Requesting permission to hold his ASA and Eliquis prior to surgery. Fax: [phone].   Phone: [phone]    Last OV with [facility] 04/28/2020  Eliquis 5mg  PO BID  ASA 81mg  PO QD

## 2020-09-05 ENCOUNTER — Encounter: Admit: 2020-09-05 | Discharge: 2020-09-05 | Payer: MEDICARE

## 2020-09-11 ENCOUNTER — Encounter: Admit: 2020-09-11 | Discharge: 2020-09-11 | Payer: MEDICARE

## 2020-09-11 DIAGNOSIS — Z950 Presence of cardiac pacemaker: Secondary | ICD-10-CM

## 2020-10-29 ENCOUNTER — Encounter: Admit: 2020-10-29 | Discharge: 2020-10-29 | Payer: MEDICARE

## 2020-10-29 DIAGNOSIS — E785 Hyperlipidemia, unspecified: Secondary | ICD-10-CM

## 2020-10-29 DIAGNOSIS — I2510 Atherosclerotic heart disease of native coronary artery without angina pectoris: Secondary | ICD-10-CM

## 2020-10-29 DIAGNOSIS — I480 Paroxysmal atrial fibrillation: Secondary | ICD-10-CM

## 2020-10-29 DIAGNOSIS — Z951 Presence of aortocoronary bypass graft: Secondary | ICD-10-CM

## 2020-10-29 DIAGNOSIS — T50905A Adverse effect of unspecified drugs, medicaments and biological substances, initial encounter: Secondary | ICD-10-CM

## 2020-10-29 DIAGNOSIS — I10 Essential (primary) hypertension: Secondary | ICD-10-CM

## 2020-10-29 DIAGNOSIS — E119 Type 2 diabetes mellitus without complications: Secondary | ICD-10-CM

## 2020-10-29 DIAGNOSIS — R9439 Abnormal result of other cardiovascular function study: Secondary | ICD-10-CM

## 2020-10-29 DIAGNOSIS — I208 Other forms of angina pectoris: Secondary | ICD-10-CM

## 2020-10-29 NOTE — Patient Instructions
Thank you for visiting our office today.   We recommend follow-up with our office in 6 months.   Please call [phone] in 1 month to schedule the office visit Dr. Mehta ordered.     Please complete the following orders:     Please schedule the echocardiogram ordered by Dr. Mehta today before leaving. Call [phone] to schedule if unable to schedule today.    Take your medications as ordered.   Check your list with what you have on hand at home.   Should you have any additional questions or concerns, please message me through MyChart or call the office.    Cardiovascular Medicine Team   Clinic phone: [phone]

## 2020-10-30 ENCOUNTER — Ambulatory Visit: Admit: 2020-10-29 | Discharge: 2020-10-30 | Payer: MEDICARE

## 2020-10-30 DIAGNOSIS — I480 Paroxysmal atrial fibrillation: Secondary | ICD-10-CM

## 2020-11-25 ENCOUNTER — Ambulatory Visit: Admit: 2020-11-25 | Discharge: 2020-11-25 | Payer: MEDICARE

## 2020-11-25 ENCOUNTER — Encounter: Admit: 2020-11-25 | Discharge: 2020-11-25 | Payer: MEDICARE

## 2020-11-25 DIAGNOSIS — I480 Paroxysmal atrial fibrillation: Secondary | ICD-10-CM

## 2020-11-25 DIAGNOSIS — Z951 Presence of aortocoronary bypass graft: Secondary | ICD-10-CM

## 2020-11-25 DIAGNOSIS — I2510 Atherosclerotic heart disease of native coronary artery without angina pectoris: Secondary | ICD-10-CM

## 2020-11-25 MED ORDER — PERFLUTREN LIPID MICROSPHERES 1.1 MG/ML IV SUSP
1-20 mL | Freq: Once | INTRAVENOUS | 0 refills | Status: CP | PRN
Start: 2020-11-25 — End: ?
  Administered 2020-11-25: 17:00:00 2 mL via INTRAVENOUS

## 2020-12-18 ENCOUNTER — Encounter: Admit: 2020-12-18 | Discharge: 2020-12-18 | Payer: MEDICARE

## 2020-12-29 ENCOUNTER — Encounter: Admit: 2020-12-29 | Discharge: 2020-12-29 | Payer: MEDICARE

## 2020-12-29 NOTE — Telephone Encounter
Patient was scheduled for a Medtronic Carelink on 12/10/20 that has not been received. Patient was instructed to send a manual transmission. Instructed if the transmitter does not appear to be working properly, they need to contact the device company directly. Patient was provided with that contact number. Requested the patient send [place] a MyChart message or contact our device nurses at [phone] to let us know after they have sent their transmission. Call placed to preferred phone number and spoke with the patients spouse, Dois Davenport. She will send over a transmission either this evening or in the morning. Verbalized understanding. DB.

## 2020-12-30 ENCOUNTER — Encounter: Admit: 2020-12-30 | Discharge: 2020-12-30 | Payer: MEDICARE

## 2021-04-01 ENCOUNTER — Encounter: Admit: 2021-04-01 | Discharge: 2021-04-01 | Payer: MEDICARE

## 2021-04-03 ENCOUNTER — Encounter: Admit: 2021-04-03 | Discharge: 2021-04-03 | Payer: MEDICARE

## 2021-05-04 ENCOUNTER — Ambulatory Visit: Admit: 2021-05-04 | Discharge: 2021-05-04 | Payer: MEDICARE

## 2021-05-04 ENCOUNTER — Encounter: Admit: 2021-05-04 | Discharge: 2021-05-04 | Payer: MEDICARE

## 2021-05-04 DIAGNOSIS — I480 Paroxysmal atrial fibrillation: Secondary | ICD-10-CM

## 2021-05-04 DIAGNOSIS — Z950 Presence of cardiac pacemaker: Secondary | ICD-10-CM

## 2021-05-04 DIAGNOSIS — I495 Sick sinus syndrome: Secondary | ICD-10-CM

## 2021-05-12 ENCOUNTER — Ambulatory Visit: Admit: 2021-05-12 | Discharge: 2021-05-12 | Payer: MEDICARE

## 2021-05-12 ENCOUNTER — Encounter: Admit: 2021-05-12 | Discharge: 2021-05-12 | Payer: MEDICARE

## 2021-05-12 DIAGNOSIS — I493 Ventricular premature depolarization: Secondary | ICD-10-CM

## 2021-05-12 DIAGNOSIS — I480 Paroxysmal atrial fibrillation: Secondary | ICD-10-CM

## 2021-05-12 DIAGNOSIS — Z951 Presence of aortocoronary bypass graft: Secondary | ICD-10-CM

## 2021-05-12 DIAGNOSIS — I2510 Atherosclerotic heart disease of native coronary artery without angina pectoris: Secondary | ICD-10-CM

## 2021-05-12 DIAGNOSIS — Z950 Presence of cardiac pacemaker: Secondary | ICD-10-CM

## 2021-05-12 DIAGNOSIS — I10 Essential (primary) hypertension: Secondary | ICD-10-CM

## 2021-05-12 DIAGNOSIS — I208 Other forms of angina pectoris: Secondary | ICD-10-CM

## 2021-05-12 DIAGNOSIS — E785 Hyperlipidemia, unspecified: Secondary | ICD-10-CM

## 2021-05-12 DIAGNOSIS — E119 Type 2 diabetes mellitus without complications: Secondary | ICD-10-CM

## 2021-05-12 DIAGNOSIS — T50905A Adverse effect of unspecified drugs, medicaments and biological substances, initial encounter: Secondary | ICD-10-CM

## 2021-05-12 DIAGNOSIS — R9439 Abnormal result of other cardiovascular function study: Secondary | ICD-10-CM

## 2021-05-12 DIAGNOSIS — I495 Sick sinus syndrome: Secondary | ICD-10-CM

## 2021-05-12 MED ORDER — EZETIMIBE 10 MG PO TAB
10 mg | ORAL_TABLET | Freq: Every day | ORAL | 3 refills | Status: AC
Start: 2021-05-12 — End: ?

## 2021-05-12 NOTE — Progress Notes
Date of Service: 05/12/2021    Marc Nelson is a 75 y.o. male.       HPI     It was a pleasure to see Marc Nelson in my office today for his cardiovascular followup.     Since his last visit, he has been doing fairly good.  Very atypical chest pains.  Unrelated to physical activity.  Does not last for more than a few seconds at a time.  Still continues to have tiredness and fatigue, but unchanged from the last 6 months.  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  His wife thinks he is still in his 43s that he can work so hard, so overall, I think his exercise abilities are very well for his age.  He just does not want to slow down.  Denies any shortness of breath as such.  No orthopnea or paroxysmal nocturnal dyspnea.     His recent echo Doppler done in April showed a normal left ventricular function.  His aortic valve was calcified with mildly reduced opening.     Hemodynamically, he is stable.  Physical examination as noted below.  Essentially, did not show any volume overload.     Lab workup shows his potassium is around 5.2.  His LDL still remains over 70.     Recommendations:     1. Patient had his [birth year] yesterday.  I have advised the patient to slow down a little bit in consultation with his wife, who was present in the room.  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  Wife says that he still does more work than his son.  2. We will start him on ezetimibe 10 mg daily to better control his lipids.  He does have a history of coronary artery disease with bypass surgery.  In addition to that, he is diabetic.  I would like to get his LDL below 70.  3. We will recheck a lipid profile and a BMP in 3 months.  4. Follow up with me unless there is any change in symptoms.     Total time spent with Marc Nelson, including reviewing his echocardiogram, medication changes, and counseling, was 35 minutes in the clinic.    (ZOX:[id number])             Vitals:    05/12/21 1000 05/12/21 1009   BP: 118/62 108/58   BP Source: Arm, Left Upper Arm, Right Upper   Pulse: 68    SpO2: 97%    PainSc: Zero    Weight: 82.8 kg (182 lb 9.6 oz)    Height: 177.8 cm (5' 10)      Body mass index is 26.2 kg/m?Marland Kitchen     Past Medical History  Patient Active Problem List    Diagnosis Date Noted   ? Hyperlipemia 06/30/2017   ? Sinus node dysfunction (HCC) 05/25/2016     Pacemaker in place.      ? 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 Essential hypertension 05/25/2016   ? Bradycardia 03/24/2016     03/24/2016  dual chamber MDT ppm implanted, ILR removed      ? 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 Dizziness 12/27/2013   ? Paroxysmal A-fib (HCC) 09/03/2011     06/20/2012 Holter monitor: revealed normal sinus rhythm, short runs of supraventricular arrhythmias, maximum of 10 beats were noted. There were no episodes of any sustained ventricular or supraventricular  arrhythmias noted.   06/21/2012 Echo: EF 60 %, normal LV systolic function. Mild mitral and tricuspid regurgitations. PA pressures were 33 mmHg. LA size 5.4 cm.   07/23/2013 Stress thallium: The patient exercised for 11 minute and 54 seconds. During peak exercise, there was 1 mm horizontal ST depression in II, III, aVF, and V5 and V6. However, SPECT images did not show any significant ischemia. Ejection fraction was noted to be 61 percent.   12/27/13 Holter: The predominating rhythm was NS. Rare Supraventricular Ectopic Beats were observed including Atrial Couplets and a Triplet. Rare Ventricular Ectopic Beats were recognized including Rare Couplets. Patient complained of SOB and weakness. No correlation of his symptoms were noted to any underlying arrhythmia.   06/21/2014 Exercise stress thallium: noted to be normal.   05/06/15 Echo: The LV EF is about 60%. The LA is mildly enlarged. Mild MR & mild TR noted. The PA systolic pressure is normal. LA size 4.5 cm.   10/20/2015 Regadenoson stress Cardiolite [facility]): no evidence of any significant ischemia and normal ejection fraction at 69%.  10/24/2015 Echo: normal left ventricular function.? Ejection fraction 65%.? 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 LA size 4.3 cm. ? ?  11/24/2015 30-day looping event monitor: multiple episodes of atrial fibrillation and flutter with ventricular rates ranging between 80-110 beats per minute.? One episode of atrial fibrillation was associated with complaints of heart racing  01/06/16 s/p Med[employer]onic LINQ cardiac memory loop recorder at the anterior chest wall (Dr. Bernette Mayers)      ? Thrombocytopenia (HCC) 08/26/2011   ? AKI (acute kidney injury) (HCC) 08/26/2011   ? CAD (coronary artery disease) 08/23/2011   ? Chest pain 07/30/2011   ? SOB (shortness of breath) 07/15/2011   ? Dyslipidemia 10/22/2009   ? DM II (diabetes mellitus, type II), controlled (HCC) 10/22/2009   ? HTN, goal below 140/90 01/02/2009         Review of Systems   Constitutional: Negative.   HENT: Negative.    Eyes: Negative.    Cardiovascular: Positive for chest pain and dyspnea on exertion.   Respiratory: Negative.    Endocrine: Negative.    Hematologic/Lymphatic: Negative.    Skin: Negative.    Gastrointestinal: Negative.    Genitourinary: Negative.    Neurological: Positive for weakness.   Psychiatric/Behavioral: Negative.    Allergic/Immunologic: Negative.        Physical Exam  General Appearance: resting comfortably, no acute distress   Neck Veins: neck veins are not distended   Thyroid: no nodules, masses, tenderness or enlargement   Chest Inspection: chest is normal in appearance, CABG incision   Respiratory Effort: breathing is unlabored, no respiratory distress   Auscultation/Percussion: lungs clear to auscultation, no rales, rhonchi, or wheezing   PMI: PMI not enlarged or displaced   Cardiac Rhythm: regular rhythm and normal rate   Cardiac Auscultation: Normal S1 &?S2, no S3 or S4, no rub   Murmurs: soft holosystolic murmur 2/6 at apex with no radiation   Carotid Arteries: normal carotid upstroke bilaterally, no bruits   Pedal Pulses: normal symmetric pedal pulses   Lower Extremity Edema: no lower extremity edema   Abdominal Exam: soft, non-tender, no masses, bowel sounds normal   Abdominal Aorta: nonpalpable abdominal aorta; no abdominal bruits   Liver &?Spleen: no organomegaly   Neurologic Exam: neurological assessment grossly intact  Orientation: oriented to time, place and person  ?  Cardiovascular Studies  June 20, 2012: A 24-hour Holter monitor revealed normal sinus rhythm, short runs of supraventricular arrhythmias, maximum of 10 beats were noted. There were no episodes of any sustained ventricular or supraventricular arrhythmias noted. June 21, 2012: Echo Doppler ejection fraction 60 percent, normal left ventricular systolic function. Mild mitral and tricuspid regurgitations. PA pressures were 33 mmHg. July 23, 2013, stress thallium: The patient exercised for 11 minute and 54 seconds. During peak exercise, there was 1 mm horizontal ST depression in II, III, aVF, and V5 and V6. However, SPECT images did not show any significant ischemia. Ejection fraction was noted to be 61 percent. June 21, 2014, exercise stress thallium noted to be normal. EKG today shows sinus bradycardia, rate of 46 beats per minute. First-degree AV block. No significant ST-T changes are noted. No significant changes compared to patient's previous electrocardiogram done on June 19, 2014   October 20, 2015, regadenoson stress Cardiolite done at [facility] revealed no evidence of any significant ischemia and normal ejection fraction at 69%.  October 24, 2015, echo Doppler?shows normal left ventricular function. ?Ejection fraction 65%. ?Grade 2 moderate left ventricular diastolic dysfunction, elevated left?atrial pressure, trace-mild tricuspid regurgitation, PA pressure is 33 mmHg, ascending aorta is mildly dilated at the sinus of Valsalva. ?  November 24, 2015, a 30-day looping event monitor showed multiple episodes of atrial fibrillation and flutter with ventricular rates ranging between 80-110 beats per minute. One episode of atrial fibrillation was associated with complaints of heart racing.  May 15, 2018 stress thallium: No evidence of any significant ischemia. ?Normal left ventricular function.  May 13, 2018 echo Doppler: Normal left ventricular function ejection fraction 60%. ?Normal diastolic function. ?Normal left atrial pressure. ?Mild mitral regurgitation and mild tricuspid regurgitation. ?Aortic valve sclerosis with slightly reduced opening.  November 22, 2019 echocardiogram: Normal left ventricular function. ?Mild aortic stenosis.  November 22, 2019 stress thallium: No evidence of any significant ischemia.  December 05, 2020 echocardiogram: Normal ventricular size with mild concentric left ventricular hypertrophy.  Left ventricular systolic function is normal with ejection fraction of 60 to 65%.  Diastolic function is indeterminate.  Moderate dilatation of the left atrium.  Aortic valve is sclerotic with slightly reduced opening.  No significant change from last year.  Estimated PA pressures are 32 mmHg.  May 04, 2021 pacemaker interrogation: Device functioning good.  EKG today shows atrially paced rhythm with a prolonged AV delay.  Occasional premature atrial contractions.  Left ventricular hypertrophy.  Nonspecific ST-T changes.  Compared from previous electrocardiogram done on October 29, 2020 there are no significant changes.      Cardiovascular Health Factors  Vitals BP Readings from Last 3 Encounters:   05/12/21 108/58   11/25/20 (!) 146/80   10/29/20 132/68     Wt Readings from Last 3 Encounters:   05/12/21 82.8 kg (182 lb 9.6 oz)   11/25/20 81.1 kg (178 lb 12.8 oz)   10/29/20 84.3 kg (185 lb 14.4 oz)     BMI Readings from Last 3 Encounters:   05/12/21 26.20 kg/m?   11/25/20 25.66 kg/m?   10/29/20 26.67 kg/m?      Smoking Social History     Tobacco Use   Smoking Status Never Smoker   Smokeless Tobacco Never Used      Lipid Profile Cholesterol   Date Value Ref Range Status   03/03/2020 128  Final     HDL  Date Value Ref Range Status   03/03/2020 42  Final     LDL   Date Value Ref Range Status   03/03/2020 76  Final     Triglycerides   Date Value Ref Range Status   03/03/2020 51  Final      Blood Sugar Hemoglobin A1C   Date Value Ref Range Status   03/03/2020 7.6  Final     Glucose   Date Value Ref Range Status   04/07/2020 103 (A) 70 - 100 Final   03/03/2020 124 (A) 70 - 99 Final   10/08/2019 195 (A) 100 Final     Glucose, POC   Date Value Ref Range Status   03/24/2016 121 (H) 70 - 100 MG/DL Final   57/84/6962 952 (H) 70 - 100 MG/DL Final   84/13/2440 102 (H) 70 - 100 MG/DL Final          Problems Addressed Today  Encounter Diagnoses   Name Primary?   ? Coronary artery disease involving native heart without angina pectoris, unspecified vessel or lesion type Yes   ? S/P CABG (coronary artery bypass graft)    ? Paroxysmal A-fib (HCC)    ? Dyslipidemia    ? Sinus node dysfunction (HCC)    ? Cardiac pacemaker in situ        Assessment and Plan     1. ?Coronary artery disease status post coronary artery bypass graft surgery. ?Denies any chest discomfort. ?Recent stress thallium did not reveal any significant ischemia.  2. ?Paroxysmal atrial fibrillation patient maintaining a AV paced rhythm. ?EKG is as noted above.  3. 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 ?Device functioning good.  4. ?Hypertension. ?Blood pressures are very well controlled.  5. ?Dyslipidemia.  Recent lab work-up in July once again shows LDL not at goal.  Added ezetimibe to atorvastatin.  6. ?Diabetes mellitus. ?Patient follows his PCP.  7.  History of PVCs.  Patient is asymptomatic.  Does not feel irregular heartbeats or palpitations.  Recent echo Doppler revealed a normal left ventricular function.  Recent stress thallium did not reveal any significant ischemia.  Plan is to continue medical management.         Current Medications (including today's revisions)  ? aspirin EC 81 mg tablet Take 81 mg by mouth daily. Take with food.   ? atorvastatin (LIPITOR) 80 mg tablet Take one tablet by mouth daily.   ? CALCIUM PO Take 400 mg by mouth daily.   ? candesartan (ATACAND) 32 mg tablet TAKE ONE-HALF TABLET BY MOUTH DAILY   ? CETIRIZINE HCL (ZYRTEC PO) Take 1 Tab by mouth as Needed.   ? cholecalciferol (Vitamin D3) (VITAMIN D-3) 1,000 units tablet Take 2,000 Units by mouth daily.     ? citalopram (CELEXA) 10 mg tablet Take 10 mg by mouth daily.   ? Coenzyme Q10 150 mg cap Take 1 Cap by mouth daily. (Patient taking differently: Take 200 mg by mouth daily.)   ? cyanocobalamin (vitamin B-12) 1,000 mcg tablet Take 1,000 mcg by mouth daily.   ? dorzolamide (TRUSOPT) 2 % ophthalmic solution Apply 1 drop to both eyes daily.   ? ELIQUIS 5 mg tablet TAKE ONE TABLET BY MOUTH TWICE A DAY   ? INSULIN GLARGINE,HUM.REC.ANLOG (LANTUS SC) Inject 25 Units under the skin at bedtime daily.   ? magnesium oxide (MAGOX) 400 mg (241.3 mg magnesium) tablet Take 400 mg by mouth daily.   ? metoprolol tartrate (LOPRESSOR) 25 mg tablet TAKE  ONE TABLET BY MOUTH TWICE A DAY   ? nitroglycerin (NITROSTAT) 0.4 mg tablet DISSOLVE 1 TAB UNDER TONGUE FOR CHEST PAIN - IF PAIN REMAINS AFTER 5 MIN, CALL 911 AND REPEAT DOSE. MAX 3 TABS IN 15 MINUTES   ? NOVOLOG FLEXPEN 100 unit/mL injection PEN 3 Units.   ? pantoprazole DR (PROTONIX) 20 mg tablet Take 20 mg by mouth daily.   ? travoprost (TRAVATAN Z) 0.004 % ophthalmic solution Apply 1 drop to both eyes at bedtime daily.

## 2021-05-12 NOTE — Patient Instructions
Thank you for visiting our office today.   We recommend follow-up with our office in 6 months.     Please complete the following orders:     Start taking zetia 10 mg once a day    Check your labs in 3 months, we will mail lab orders. You do need to be fasting when lab is drawn. (Nothing except water and prescription medications for 12 hrs before blood is drawn.)    Take your medications as ordered.   Check your list with what you have on hand at home.   Should you have any additional questions or concerns, please message me through MyChart or call the office.    Cardiovascular Medicine Team   Clinic phone: [phone]

## 2021-06-30 ENCOUNTER — Encounter: Admit: 2021-06-30 | Discharge: 2021-06-30 | Payer: MEDICARE

## 2021-07-01 ENCOUNTER — Encounter: Admit: 2021-07-01 | Discharge: 2021-07-01 | Payer: MEDICARE

## 2021-07-01 DIAGNOSIS — Z950 Presence of cardiac pacemaker: Secondary | ICD-10-CM

## 2021-07-01 NOTE — Telephone Encounter
MEH recommends chest x-ray. Spoke with pt's wife, she is agreeable to plan. She will pick up order in clinic.       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     Please review with Dr. Bufford Buttner.   Thank you,   Victorino Dike

## 2021-07-03 NOTE — Progress Notes
07/03/2021 4:56 PM     Pt. CXR Results from [facility]

## 2021-07-06 ENCOUNTER — Encounter: Admit: 2021-07-06 | Discharge: 2021-07-06 | Payer: MEDICARE

## 2021-08-07 ENCOUNTER — Encounter: Admit: 2021-08-07 | Discharge: 2021-08-07 | Payer: MEDICARE

## 2021-08-07 NOTE — Telephone Encounter
08-07-21  Previously seen by CVM.  No records requested.  tlv

## 2021-08-12 ENCOUNTER — Encounter: Admit: 2021-08-12 | Discharge: 2021-08-12 | Payer: MEDICARE

## 2021-08-12 DIAGNOSIS — E785 Hyperlipidemia, unspecified: Secondary | ICD-10-CM

## 2021-08-12 DIAGNOSIS — I2510 Atherosclerotic heart disease of native coronary artery without angina pectoris: Secondary | ICD-10-CM

## 2021-08-12 DIAGNOSIS — Z951 Presence of aortocoronary bypass graft: Secondary | ICD-10-CM

## 2021-08-12 LAB — BASIC METABOLIC PANEL
ANION GAP: 6
CALCIUM: 9.7
CHLORIDE: 112 — AB
GLUCOSE,PANEL: 141 — AB
POTASSIUM: 5.1
SODIUM: 140

## 2021-08-12 LAB — LIPID PROFILE
CHOLESTEROL/HDL %: 2.1
CHOLESTEROL: 91
HDL: 44
NON HDL CHOLESTEROL: 47
TRIGLYCERIDES: 53 — AB

## 2021-08-13 ENCOUNTER — Encounter: Admit: 2021-08-13 | Discharge: 2021-08-13 | Payer: MEDICARE

## 2021-08-13 DIAGNOSIS — I480 Paroxysmal atrial fibrillation: Secondary | ICD-10-CM

## 2021-08-14 ENCOUNTER — Ambulatory Visit: Admit: 2021-08-14 | Discharge: 2021-08-14 | Payer: MEDICARE

## 2021-08-14 ENCOUNTER — Encounter: Admit: 2021-08-14 | Discharge: 2021-08-14 | Payer: MEDICARE

## 2021-08-14 DIAGNOSIS — E119 Type 2 diabetes mellitus without complications: Secondary | ICD-10-CM

## 2021-08-14 DIAGNOSIS — Z950 Presence of cardiac pacemaker: Secondary | ICD-10-CM

## 2021-08-14 DIAGNOSIS — R0789 Other chest pain: Secondary | ICD-10-CM

## 2021-08-14 DIAGNOSIS — I495 Sick sinus syndrome: Secondary | ICD-10-CM

## 2021-08-14 DIAGNOSIS — T50905A Adverse effect of unspecified drugs, medicaments and biological substances, initial encounter: Secondary | ICD-10-CM

## 2021-08-14 DIAGNOSIS — I10 Essential (primary) hypertension: Secondary | ICD-10-CM

## 2021-08-14 DIAGNOSIS — I2510 Atherosclerotic heart disease of native coronary artery without angina pectoris: Secondary | ICD-10-CM

## 2021-08-14 DIAGNOSIS — I480 Paroxysmal atrial fibrillation: Secondary | ICD-10-CM

## 2021-08-14 DIAGNOSIS — R079 Chest pain, unspecified: Secondary | ICD-10-CM

## 2021-08-14 DIAGNOSIS — E785 Hyperlipidemia, unspecified: Secondary | ICD-10-CM

## 2021-08-14 DIAGNOSIS — I208 Other forms of angina pectoris: Secondary | ICD-10-CM

## 2021-08-14 DIAGNOSIS — R9439 Abnormal result of other cardiovascular function study: Secondary | ICD-10-CM

## 2021-08-14 DIAGNOSIS — R0989 Other specified symptoms and signs involving the circulatory and respiratory systems: Secondary | ICD-10-CM

## 2021-08-14 DIAGNOSIS — R42 Dizziness and giddiness: Secondary | ICD-10-CM

## 2021-08-14 MED ORDER — NITROGLYCERIN 0.4 MG SL SUBL
ORAL_TABLET | SUBLINGUAL | 0 refills | 9.00000 days | Status: AC
Start: 2021-08-14 — End: ?

## 2021-08-14 NOTE — Patient Instructions
Thank you for visiting our office today.     Please complete the following orders:     Please schedule the echocardiogram and scan of your neck ordered by Dr. Bufford Buttner today before leaving. Call [phone] to schedule if unable to schedule today.    Take your medications as ordered.   Check your list with what you have on hand at home.   Should you have any additional questions or concerns, please message me through MyChart or call the office.    Cardiovascular Medicine Team   Clinic phone: [phone]

## 2021-08-14 NOTE — Progress Notes
TOV: From Dr. Wallene Huh after review of 08/13/21 remote, pt symptoms, okay to program DDIR at this time to see if symptoms improve.   Dr. Bufford Buttner was present during check and said to also increase his lower rate to 70 bpm as this time.

## 2021-08-17 ENCOUNTER — Encounter: Admit: 2021-08-17 | Discharge: 2021-08-17 | Payer: MEDICARE

## 2021-08-18 ENCOUNTER — Encounter: Admit: 2021-08-18 | Discharge: 2021-08-18 | Payer: MEDICARE

## 2021-08-18 NOTE — Telephone Encounter
Returned call and spoke with pt's spouse, Andrey Campanile, after verifying DOB.   Pt doing better overall. Wife reports that he is limiting activity some also the past few days.    Will follow up 08/25/2021 in office with Dr. Wallene Huh.

## 2021-08-18 NOTE — Telephone Encounter
-----   Message from Verdis Frederickson, RN sent at 08/13/2021  4:38 PM CST -----  Regarding: FW: I asked pt to send remote  DDIR  with lower AT/AF detection. ???  ----- Message -----  From: Unice Cobble, RN  Sent: 08/13/2021  12:10 PM CST  To: Mac Ep Remote  Subject: I asked pt to send remote                        08/13/2021 12:09 PM   Pt c/o being light headed/dizzy/soa.   He will see [facility] tomorrow in [place].   I asked him to send remote today.     His last device check had something questionable about lead, cxr seemed fine.   Has appt coming up w/ RAD in January.   Pls let us know if something concerning!  Angie

## 2021-08-18 NOTE — Telephone Encounter
LVM with pt to see if he is still feeling okay after programming on Friday. No answer, LVM and said I would call back later today.     Will try again. See pt missed stress test.

## 2021-08-20 ENCOUNTER — Encounter: Admit: 2021-08-20 | Discharge: 2021-08-20 | Payer: MEDICARE

## 2021-08-21 ENCOUNTER — Ambulatory Visit: Admit: 2021-08-21 | Discharge: 2021-08-21 | Payer: MEDICARE

## 2021-08-21 ENCOUNTER — Encounter: Admit: 2021-08-21 | Discharge: 2021-08-21 | Payer: MEDICARE

## 2021-08-21 DIAGNOSIS — R079 Chest pain, unspecified: Secondary | ICD-10-CM

## 2021-08-21 DIAGNOSIS — R42 Dizziness and giddiness: Secondary | ICD-10-CM

## 2021-08-21 MED ORDER — EUCALYPTUS-MENTHOL MM LOZG
1 | Freq: Once | ORAL | 0 refills | Status: AC | PRN
Start: 2021-08-21 — End: ?

## 2021-08-21 MED ORDER — RP DX TC-99M TETROFOSMIN MCI
18 | Freq: Once | INTRAVENOUS | 0 refills | Status: CP
Start: 2021-08-21 — End: ?
  Administered 2021-08-21: 16:00:00 19.2 via INTRAVENOUS

## 2021-08-21 MED ORDER — ALBUTEROL SULFATE 90 MCG/ACTUATION IN HFAA
2 | RESPIRATORY_TRACT | 0 refills | Status: AC | PRN
Start: 2021-08-21 — End: ?

## 2021-08-21 MED ORDER — REGADENOSON 0.4 MG/5 ML IV SYRG
.4 mg | Freq: Once | INTRAVENOUS | 0 refills | Status: CP
Start: 2021-08-21 — End: ?
  Administered 2021-08-21: 15:00:00 0.4 mg via INTRAVENOUS

## 2021-08-21 MED ORDER — RP DX TC-99M TETROFOSMIN MCI
6 | Freq: Once | INTRAVENOUS | 0 refills | Status: CP
Start: 2021-08-21 — End: ?
  Administered 2021-08-21: 15:00:00 6.5 via INTRAVENOUS

## 2021-08-21 MED ORDER — SODIUM CHLORIDE 0.9 % IV SOLP
250 mL | INTRAVENOUS | 0 refills | Status: AC | PRN
Start: 2021-08-21 — End: ?

## 2021-08-21 MED ORDER — NITROGLYCERIN 0.4 MG SL SUBL
.4 mg | SUBLINGUAL | 0 refills | Status: AC | PRN
Start: 2021-08-21 — End: ?

## 2021-08-21 MED ORDER — AMINOPHYLLINE 500 MG/20 ML IV SOLN
50 mg | INTRAVENOUS | 0 refills | Status: AC | PRN
Start: 2021-08-21 — End: ?

## 2021-08-25 ENCOUNTER — Encounter: Admit: 2021-08-25 | Discharge: 2021-08-25 | Payer: MEDICARE

## 2021-08-25 ENCOUNTER — Ambulatory Visit: Admit: 2021-08-25 | Discharge: 2021-08-25 | Payer: MEDICARE

## 2021-08-25 DIAGNOSIS — E119 Type 2 diabetes mellitus without complications: Secondary | ICD-10-CM

## 2021-08-25 DIAGNOSIS — T82110A Breakdown (mechanical) of cardiac electrode, initial encounter: Secondary | ICD-10-CM

## 2021-08-25 DIAGNOSIS — I480 Paroxysmal atrial fibrillation: Secondary | ICD-10-CM

## 2021-08-25 DIAGNOSIS — I2510 Atherosclerotic heart disease of native coronary artery without angina pectoris: Secondary | ICD-10-CM

## 2021-08-25 DIAGNOSIS — I495 Sick sinus syndrome: Secondary | ICD-10-CM

## 2021-08-25 DIAGNOSIS — T50905A Adverse effect of unspecified drugs, medicaments and biological substances, initial encounter: Secondary | ICD-10-CM

## 2021-08-25 DIAGNOSIS — E785 Hyperlipidemia, unspecified: Secondary | ICD-10-CM

## 2021-08-25 DIAGNOSIS — R0989 Other specified symptoms and signs involving the circulatory and respiratory systems: Secondary | ICD-10-CM

## 2021-08-25 DIAGNOSIS — R9439 Abnormal result of other cardiovascular function study: Secondary | ICD-10-CM

## 2021-08-25 DIAGNOSIS — I208 Other forms of angina pectoris: Secondary | ICD-10-CM

## 2021-08-25 DIAGNOSIS — I10 Essential (primary) hypertension: Secondary | ICD-10-CM

## 2021-08-25 NOTE — Progress Notes
Date of Service: 08/25/2021    Marc Nelson is a 76 y.o. male.       HPI     I had the pleasure of seeing Mr. Makale Nerby in our office today for cardiac electrophysiology consultation regarding atrial lead malfunction in response to request from my colleague Dr. Bufford Buttner.    As you recall Mr. 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  Approximately around the same time he started transmitting episodes with atrial lead noise artifact intermittently and significant amount of time.  He may have a had several falls.  He also started noticing dizzy spells since then and had at least 3-4 of them including 1 week ago.  He does not feel very strong and has some shortness of breath with activity.  His device was programmed to DDIR and in spite of that he continues to have symptoms.    He is here to discuss the next course of action.    IMPRESSION AND PLAN:    I reviewed his twelve-lead EKG which shows sinus rhythm at a rate of 67 bpm.    Device interrogation shows he has a Medtronic dual-chamber pacemaker implanted in August 2017 is programmed to DDIR 60 to 125 bpm.  He is atrially paced 84% of the time V pacing 95% of the time he is not dependent but underlying rhythm is slow.  If you look at the histograms, his nice levels have significantly gone up since August 2022.  Thresholds seem to be intact RV lead is functioning normally.  Both the leads are 5076 active-fixation leads.  I have compared to the times when patient is using farm equipment like using a  slid mower and chopper and the times when the device is noticing noise.  There is not necessarily a correlation.  Sometimes movement of the arm is able to reproduce the noise.    His last echo showed ejection fraction was normal.  His stress test in January 2023 showed no evidence of significant ischemia EF 61%.  His hemoglobin A1c is around 7.2 which is significant improvement from before.    1.  Atrial lead malfunction with significant amount of noise preventing appropriate sensing and pacing.  He has significant symptoms of dizziness shortness of breath etc.  Not clear how much of the symptoms are related to the atrial lead noise but certainly could be related.  He is interested in replacing the atrial lead.  We discussed about both extraction approach and capping the lead.  He understands that by capping the lead we make his device not MRI compatible as well as some potential risk of venous obstruction in the future.  He also understands that extraction involves additional risk including less than 5% of cardiac perforation requiring emergency cardiac surgery.  He strongly believes that he would like to go for extraction and new RV lead placement.  We will go ahead and order a chest x-ray to document the lead locations.  I have contacted Dr. Bernette Mayers who implanted his original device to  consider the extraction procedure.  Patient will get appropriate instructions and a mutually convenient date.    2.  Coronary artery disease status post CABG: His last stress test was negative for any significant ischemia.    3.  Hypertension his blood pressure is high today but at home well controlled.    4.  Diabetes mellitus better controlled than before he continues to work on them.    Thank you for letting us participate in care of your patient please feel free to contact us with any questions or concerns.       Vitals:    08/25/21 0848   BP: (!) 145/73   BP Source: Arm, Left Upper Pulse: 67   SpO2: 98%   O2 Device: None (Room air)   PainSc: Zero   Weight: 83.5 kg (184 lb)   Height: 177.8 cm (5' 10)     Body mass index is 26.4 kg/m?Marland Kitchen     Past Medical History  Patient Active Problem List    Diagnosis Date Noted   ? Hyperlipemia 06/30/2017   ? Sinus node dysfunction (HCC) 05/25/2016     Pacemaker in place.      ? 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 Essential hypertension 05/25/2016   ? Bradycardia 03/24/2016     03/24/2016  dual chamber MDT ppm implanted, ILR removed      ? 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 Dizziness 12/27/2013   ? Paroxysmal A-fib (HCC) 09/03/2011     06/20/2012 Holter monitor: revealed normal sinus rhythm, short runs of supraventricular arrhythmias, maximum of 10 beats were noted. There were no episodes of any sustained ventricular or supraventricular arrhythmias noted.   06/21/2012 Echo: EF 60 %, normal LV systolic function. Mild mitral and tricuspid regurgitations. PA pressures were 33 mmHg. LA size 5.4 cm.   07/23/2013 Stress thallium: The patient exercised for 11 minute and 54 seconds. During peak exercise, there was 1 mm horizontal ST depression in II, III, aVF, and V5 and V6. However, SPECT images did not show any significant ischemia. Ejection fraction was noted to be 61 percent.   12/27/13 Holter: The predominating rhythm was NS. Rare Supraventricular Ectopic Beats were observed including Atrial Couplets and a Triplet. Rare Ventricular Ectopic Beats were recognized including Rare Couplets. Patient complained of SOB and weakness. No correlation of his symptoms were noted to any underlying arrhythmia.   06/21/2014 Exercise stress thallium: noted to be normal.   05/06/15 Echo: The LV EF is about 60%. The LA is mildly enlarged. Mild MR & mild TR noted. The PA systolic pressure is normal. LA size 4.5 cm.   10/20/2015 Regadenoson stress Cardiolite [facility]): no evidence of any significant ischemia and normal ejection fraction at 69%.  10/24/2015 Echo: normal left ventricular function.? Ejection fraction 65%.? 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 LA size 4.3 cm. ? ?  11/24/2015 30-day looping event monitor: multiple episodes of atrial fibrillation and flutter with ventricular rates ranging between 80-110 beats per minute.? One episode of atrial fibrillation was associated with complaints of heart racing  01/06/16 s/p Medtronic LINQ cardiac memory loop recorder at the anterior chest wall (Dr. Bernette Mayers)      ? Thrombocytopenia (HCC) 08/26/2011   ? AKI (acute kidney injury) (HCC) 08/26/2011   ? CAD (coronary artery disease) 08/23/2011   ? Chest  pain 07/30/2011   ? SOB (shortness of breath) 07/15/2011   ? Dyslipidemia 10/22/2009   ? DM II (diabetes mellitus, type II), controlled (HCC) 10/22/2009   ? HTN, goal below 140/90 01/02/2009         Review of Systems   Constitutional: Negative.   HENT: Negative.    Eyes: Negative.    Cardiovascular: Positive for dyspnea on exertion.   Respiratory: Negative.    Endocrine: Negative.    Hematologic/Lymphatic: Negative.    Skin: Negative.    Musculoskeletal: Negative.    Gastrointestinal: Positive for abdominal pain.   Genitourinary: Negative.    Neurological: Positive for dizziness and light-headedness.   Psychiatric/Behavioral: Negative.    Allergic/Immunologic: Negative.        Physical Exam  Patient is a thinly built gentleman who is comfortable at rest,  not in any distress.  Sclerae anicteric.  The oral mucosa is moist and pink.  Neck is  supple without any lymphadenopathy.  Lungs are clear to auscultation bilaterally.  Breath  sounds are normal.  Cardiac exam reveals normal S1, S2 with regular rate and  rhythm.  No murmurs, rubs or gallops noted.  Abdomen:  Soft, nontender, nondistended.  Bowel sounds are present.  Extremities:  No cyanosis, clubbing or edema.  Peripheral  pulses are symmetric.  Skin without any rash. . No gross motor or neuro deficits.    Cardiovascular Studies      Cardiovascular Health Factors  Vitals BP Readings from Last 3 Encounters:   08/25/21 (!) 145/73   08/14/21 120/66   05/12/21 108/58     Wt Readings from Last 3 Encounters:   08/25/21 83.5 kg (184 lb)   08/14/21 82.4 kg (181 lb 11.2 oz)   05/12/21 82.8 kg (182 lb 9.6 oz)     BMI Readings from Last 3 Encounters:   08/25/21 26.40 kg/m?   08/14/21 26.07 kg/m?   05/12/21 26.20 kg/m?      Smoking Social History     Tobacco Use   Smoking Status Never   Smokeless Tobacco Never      Lipid Profile Cholesterol   Date Value Ref Range Status   08/11/2021 91  Final     HDL   Date Value Ref Range Status   08/11/2021 44  Final     LDL   Date Value Ref Range Status   08/11/2021 36.4  Final     Triglycerides   Date Value Ref Range Status   08/11/2021 53  Final      Blood Sugar Hemoglobin A1C   Date Value Ref Range Status   03/03/2020 7.6  Final     Glucose   Date Value Ref Range Status   08/11/2021 141 (A)  Final   04/07/2020 103 (A) 70 - 100 Final   03/03/2020 124 (A) 70 - 99 Final     Glucose, POC   Date Value Ref Range Status   03/24/2016 121 (H) 70 - 100 MG/DL Final   16/05/9603 540 (H) 70 - 100 MG/DL Final   98/06/9146 829 (H) 70 - 100 MG/DL Final          Problems Addressed Today  Encounter Diagnoses   Name Primary?   ? Cardiovascular symptoms Yes       Assessment and Plan     Impression and Plan as above.         Current Medications (including today's revisions)  ? aspirin EC 81 mg tablet Take 81 mg by mouth  daily. Take with food.   ? atorvastatin (LIPITOR) 80 mg tablet Take one tablet by mouth daily.   ? candesartan (ATACAND) 32 mg tablet TAKE ONE-HALF TABLET BY MOUTH DAILY   ? CETIRIZINE HCL (ZYRTEC PO) Take 1 Tab by mouth as Needed.   ? cholecalciferol (Vitamin D3) (VITAMIN D-3) 1,000 units tablet Take 2,000 Units by mouth daily.     ? citalopram (CELEXA) 10 mg tablet Take 10 mg by mouth daily.   ? Coenzyme Q10 150 mg cap Take 1 Cap by mouth daily. (Patient taking differently: Take 200 mg by mouth daily.)   ? cyanocobalamin (vitamin B-12) 1,000 mcg tablet Take 1,000 mcg by mouth daily.   ? dorzolamide (TRUSOPT) 2 % ophthalmic solution Apply 1 drop to both eyes daily.   ? ELIQUIS 5 mg tablet TAKE ONE TABLET BY MOUTH TWICE A DAY   ? ezetimibe (ZETIA) 10 mg tablet Take one tablet by mouth daily.   ? INSULIN GLARGINE,HUM.REC.ANLOG (LANTUS SC) Inject 25 Units under the skin at bedtime daily.   ? metoprolol tartrate (LOPRESSOR) 25 mg tablet TAKE ONE TABLET BY MOUTH TWICE A DAY   ? nitroglycerin (NITROSTAT) 0.4 mg tablet DISSOLVE 1 TAB UNDER TONGUE FOR CHEST PAIN - IF PAIN REMAINS AFTER 5 MIN, CALL 911 AND REPEAT DOSE. MAX 3 TABS IN 15 MINUTES   ? NOVOLOG FLEXPEN 100 unit/mL injection PEN 3 Units.   ? pantoprazole DR (PROTONIX) 20 mg tablet Take 20 mg by mouth daily.   ? travoprost (TRAVATAN Z) 0.004 % ophthalmic solution Apply 1 drop to both eyes at bedtime daily.

## 2021-09-18 ENCOUNTER — Ambulatory Visit: Admit: 2021-09-18 | Discharge: 2021-09-18 | Payer: MEDICARE

## 2021-09-18 ENCOUNTER — Encounter: Admit: 2021-09-18 | Discharge: 2021-09-18 | Payer: MEDICARE

## 2021-09-18 DIAGNOSIS — R42 Dizziness and giddiness: Secondary | ICD-10-CM

## 2021-09-18 DIAGNOSIS — R0989 Other specified symptoms and signs involving the circulatory and respiratory systems: Secondary | ICD-10-CM

## 2021-09-18 DIAGNOSIS — I480 Paroxysmal atrial fibrillation: Secondary | ICD-10-CM

## 2021-09-18 MED ORDER — PERFLUTREN LIPID MICROSPHERES 1.1 MG/ML IV SUSP
1-10 mL | Freq: Once | INTRAVENOUS | 0 refills | Status: CP | PRN
Start: 2021-09-18 — End: ?
  Administered 2021-09-18: 20:00:00 1.2 mL via INTRAVENOUS

## 2021-09-21 ENCOUNTER — Encounter: Admit: 2021-09-21 | Discharge: 2021-09-21 | Payer: MEDICARE

## 2021-09-21 DIAGNOSIS — R0989 Other specified symptoms and signs involving the circulatory and respiratory systems: Secondary | ICD-10-CM

## 2021-10-01 ENCOUNTER — Encounter: Admit: 2021-10-01 | Discharge: 2021-10-01 | Payer: MEDICARE

## 2021-10-01 NOTE — Progress Notes
Referral Info  Reason for extraction:  RA lead noise  Consultation Date:  3/31  Extracting Physician:  [facility]S  Procedure Date:  5/2  Anticipated Procedure Details   Explant: RA lead  Implant: RA lead  Patient Info    Last EF:  65% (09/18/21 echo)   Dependent?  N  Science writerGenerator Info   Generator Type:  PPM  Make:  MDT  ModelRosalio Macadamia:  Advisa DR MRI [id number]  Implant Date:  03/24/16  Laterality:  L  Battery Life:  3.5 years  Lead Info   RA   Make:  MDT  Model:  [id number]  Implant Date:  03/24/16  RV   Make:  MDT  Model:  [id number]  Implant Date:  03/24/16  LV   Make:  n/a  Model:  n/a  Implant Date:  n/a  Notes:

## 2021-10-05 ENCOUNTER — Encounter: Admit: 2021-10-05 | Discharge: 2021-10-05 | Payer: MEDICARE

## 2021-10-05 NOTE — Telephone Encounter
10/05/2021 2:49 PM Pt wife called asking how long pt should hold eliquis prior to dental extraction  I called and sp w/ Marc Nelson is having a tooth pulled on 2/27. He's seeing an oral surgeon, Dr Sinclair Grooms, was instructed to "talk w/your cardiologist about holding blood thinners"   Pt takes ASA 81mg  and Eliquis 5mg  bid  Dx CAD/CABG, PAF, DM, HTN etc.   I let know that I'll check w/ [facility] but our preference is not to hold, the surgeon did not give a preference.

## 2021-10-06 ENCOUNTER — Encounter: Admit: 2021-10-06 | Discharge: 2021-10-06 | Payer: MEDICARE

## 2021-10-16 ENCOUNTER — Encounter: Admit: 2021-10-16 | Discharge: 2021-10-16 | Payer: MEDICARE

## 2021-10-16 ENCOUNTER — Ambulatory Visit: Admit: 2021-10-16 | Discharge: 2021-10-16 | Payer: MEDICARE

## 2021-10-16 DIAGNOSIS — I480 Paroxysmal atrial fibrillation: Secondary | ICD-10-CM

## 2021-10-16 DIAGNOSIS — R9439 Abnormal result of other cardiovascular function study: Secondary | ICD-10-CM

## 2021-10-16 DIAGNOSIS — I2510 Atherosclerotic heart disease of native coronary artery without angina pectoris: Secondary | ICD-10-CM

## 2021-10-16 DIAGNOSIS — E785 Hyperlipidemia, unspecified: Secondary | ICD-10-CM

## 2021-10-16 DIAGNOSIS — I6522 Occlusion and stenosis of left carotid artery: Secondary | ICD-10-CM

## 2021-10-16 DIAGNOSIS — T50905A Adverse effect of unspecified drugs, medicaments and biological substances, initial encounter: Secondary | ICD-10-CM

## 2021-10-16 DIAGNOSIS — I208 Other forms of angina pectoris: Secondary | ICD-10-CM

## 2021-10-16 DIAGNOSIS — I10 Essential (primary) hypertension: Secondary | ICD-10-CM

## 2021-10-16 DIAGNOSIS — E119 Type 2 diabetes mellitus without complications: Secondary | ICD-10-CM

## 2021-10-16 NOTE — Progress Notes
Date of Service: 10/16/2021    Marc Nelson is a 76 y.o. male.       HPI     Marc Nelson comes today for his first visit with me in regards to carotid artery disease.  He is a remarkably pleasant 76 year old gentleman who follows with Marc Nelson in our practice and a history of coronary artery disease and paroxysmal atrial fibrillation and prior bypass surgery.    He really does not have any symptoms attributable to anterior circulation.  He reports occasional dizziness without falls.  He has not had any visual disturbances or neurological symptoms that would be concerning for a TIA or stroke.  Marc Nelson did hear a bruit recently and ordered a carotid duplex.  I have reviewed the images and it does show a bulky plaque in the left proximal ICA causing a greater than 80% stenosis with a peak velocity of 4.6 m/s end-diastolic velocity of 1.5 and a ratio of 7.3.  The right side has mild plaque without significant stenosis.  The patient's last duplex ultrasound was in 2013 and was without significant disease on either side.    Social history:: He does not use tobacco in fact has not smoked since 1969.  He does not use alcohol in significant quantities.  He is married and was accompanied by his wife.    Past medical history:  1. 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 Paroxysmal atrial fibrillation  3. 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 Hypertension  5. Dyslipidemia  6. Diabetes mellitus on insulin         Vitals:    10/16/21 0947 10/16/21 0951 10/16/21 0954   BP:  (!) 142/78 (!) 140/70   BP Source:  Arm, Left Upper Arm, Right Upper   Pulse:  64 63   SpO2:  95%    O2 Device: None (Room air)     PainSc:  Zero    Weight:  83.9 kg (185 lb)    Height:  177.8 cm (5' 10)      Body mass index is 26.54 kg/m?Marland Kitchen     Past Medical History  Patient Active Problem List    Diagnosis Date Noted   ? Hyperlipemia 06/30/2017   ? Sinus node dysfunction (HCC) 05/25/2016     Pacemaker in place.      ? 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 Essential hypertension 05/25/2016   ? Bradycardia 03/24/2016     03/24/2016  dual chamber MDT ppm implanted, ILR removed      ? 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 Dizziness 12/27/2013   ? Paroxysmal A-fib (HCC) 09/03/2011     06/20/2012 Holter monitor: revealed normal sinus rhythm, short runs of supraventricular arrhythmias, maximum of 10 beats were noted. There were no episodes of any sustained ventricular or supraventricular arrhythmias noted.   06/21/2012 Echo: EF 60 %, normal LV systolic function. Mild mitral and tricuspid regurgitations. PA pressures were 33 mmHg. LA size 5.4 cm.   07/23/2013 Stress thallium: The patient exercised for 11 minute and 54 seconds. During peak exercise, there was 1 mm horizontal ST depression in II, III, aVF, and V5 and V6. However, SPECT images did not show any significant ischemia. Ejection fraction was noted to be 61 percent.   12/27/13 Holter: The predominating rhythm was NS. Rare Supraventricular Ectopic Beats were observed including Atrial Couplets and a Triplet. Rare Ventricular Ectopic Beats were recognized including Rare Couplets. Patient  complained of SOB and weakness. No correlation of his symptoms were noted to any underlying arrhythmia.   06/21/2014 Exercise stress thallium: noted to be normal.   05/06/15 Echo: The LV EF is about 60%. The LA is mildly enlarged. Mild MR & mild TR noted. The PA systolic pressure is normal. LA size 4.5 cm.   10/20/2015 Regadenoson stress Cardiolite [facility]): no evidence of any significant ischemia and normal ejection fraction at 69%.  10/24/2015 Echo: normal left ventricular function.? Ejection fraction 65%.? 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 LA size 4.3 cm. ? ?  11/24/2015 30-day looping event monitor: multiple episodes of atrial fibrillation and flutter with ventricular rates ranging between 80-110 beats per minute.? One episode of atrial fibrillation was associated with complaints of heart racing  01/06/16 s/p Medtronic LINQ cardiac memory loop recorder at the anterior chest wall (Dr. Bernette Mayers)      ? Thrombocytopenia (HCC) 08/26/2011   ? AKI (acute kidney injury) (HCC) 08/26/2011   ? CAD (coronary artery disease) 08/23/2011   ? Chest pain 07/30/2011   ? SOB (shortness of breath) 07/15/2011   ? Dyslipidemia 10/22/2009   ? DM II (diabetes mellitus, type II), controlled (HCC) 10/22/2009   ? HTN, goal below 140/90 01/02/2009         Review of Systems   Constitutional: Negative.   HENT: Negative.    Eyes: Negative.    Cardiovascular: Negative.    Respiratory: Negative.    Endocrine: Negative.    Hematologic/Lymphatic: Negative.    Skin: Negative.    Musculoskeletal: Negative.    Gastrointestinal: Negative.    Genitourinary: Negative.    Neurological: Positive for dizziness.   Psychiatric/Behavioral: Negative.    Allergic/Immunologic: Negative.        Physical Exam    Physical Exam  General Appearance: no acute distress   Skin: warm, moist, no ulcers   HENT: unremarkable   Eyes; Pupils are round and reactive. No icterus  Neck Veins: neck veins are flat, neck veins are not distended   Carotid Arteries: normal carotid upstroke bilaterally, left carotid bruit  Chest Inspection: chest is normal in appearance   Auscultation/Percussion: lungs clear to auscultation, no rales, rhonchi, or wheezing   Cardiac Rhythm: regular rhythm and normal rate   Cardiac Auscultation: Normal S1 & S2, no S3 or S4, no rub   Murmurs: no cardiac murmurs   Extremities: No pedal edema.  Abdominal Exam: soft, non-tender, no masses, bowel sounds normal   Liver & Spleen: no organomegaly   Neurologic Exam: neurological assessment grossly intact      Cardiovascular Health Factors  Vitals BP Readings from Last 3 Encounters:   10/16/21 (!) 140/70 09/18/21 118/66   08/25/21 (!) 145/73     Wt Readings from Last 3 Encounters:   10/16/21 83.9 kg (185 lb)   09/18/21 81.7 kg (180 lb 3.2 oz)   08/25/21 83.5 kg (184 lb)     BMI Readings from Last 3 Encounters:   10/16/21 26.54 kg/m?   09/18/21 25.86 kg/m?   08/25/21 26.40 kg/m?      Smoking Social History     Tobacco Use   Smoking Status Never   Smokeless Tobacco Never      Lipid Profile Cholesterol   Date Value Ref Range Status   08/11/2021 91  Final     HDL  Date Value Ref Range Status   08/11/2021 44  Final     LDL   Date Value Ref Range Status   08/11/2021 36.4  Final     Triglycerides   Date Value Ref Range Status   08/11/2021 53  Final      Blood Sugar Hemoglobin A1C   Date Value Ref Range Status   03/03/2020 7.6  Final     Glucose   Date Value Ref Range Status   08/11/2021 141 (A)  Final   04/07/2020 103 (A) 70 - 100 Final   03/03/2020 124 (A) 70 - 99 Final     Glucose, POC   Date Value Ref Range Status   03/24/2016 121 (H) 70 - 100 MG/DL Final   16/05/9603 540 (H) 70 - 100 MG/DL Final   98/06/9146 829 (H) 70 - 100 MG/DL Final          Problems Addressed Today  Encounter Diagnoses   Name Primary?   ? Stenosis of left carotid artery Yes       Assessment and Plan     Asymptomatic severe left carotid artery stenosis: The patient remains asymptomatic but has had progressively worsening stenosis as in 2013 he had no stenosis.  He has not smoked in 53 years and his blood pressure has been well controlled as have his lipids.  Thus in spite of optimal medical management he has had progressive stenosis.  I do believe that based on current evidence of progressive disease and risk of future events he will benefit from revascularization.    I will be ordering a CTA of the head and neck to better understand the anatomy to decide the best mode for revascularization.  We discussed the options of conventional endarterectomy, TCAR as well as transfemoral stenting.  He understands there is a small risk of stroke with this procedure and other complications that may be unique to each procedure.    He wishes to proceed with a CTA and I will review and make further recommendations after that.  I do not see a reason to start clopidogrel at this time.  He does take Eliquis at therapeutic dose.    Coronary artery disease and preoperative clearance: The patient is asymptomatic with reasonable effort capacity.  His LV systolic function is normal and a stress test which was a myocardial perfusion scan last month did not show any significant ischemia and was overall a low risk scan thus he is at low risk for perioperative events for either carotid endarterectomy or stent procedures.    In terms of bridging for surgery or stenting, it would be okay to hold Eliquis for 2 days without bridging and continue aspirin.  We can decide about clopidogrel depending on modality of revascularization.         Current Medications (including today's revisions)  ? aspirin EC 81 mg tablet Take one tablet by mouth daily. Take with food.   ? atorvastatin (LIPITOR) 80 mg tablet Take one tablet by mouth daily.   ? candesartan (ATACAND) 32 mg tablet TAKE ONE-HALF TABLET BY MOUTH DAILY   ? CETIRIZINE HCL (ZYRTEC PO) Take 1 Tab by mouth as Needed.   ? cholecalciferol (Vitamin D3) (VITAMIN D-3) 1,000 units tablet Take two tablets by mouth daily.     ? citalopram (CELEXA) 10 mg tablet Take one tablet by mouth daily.   ? Coenzyme Q10 150 mg cap Take 1 Cap by mouth daily. (Patient taking differently: Take 1.3333 capsules by mouth  daily.)   ? cyanocobalamin (vitamin B-12) 1,000 mcg tablet Take one tablet by mouth daily.   ? dorzolamide (TRUSOPT) 2 % ophthalmic solution Apply one drop to both eyes daily.   ? ELIQUIS 5 mg tablet TAKE ONE TABLET BY MOUTH TWICE A DAY   ? ezetimibe (ZETIA) 10 mg tablet Take one tablet by mouth daily.   ? INSULIN GLARGINE,HUM.REC.ANLOG (LANTUS SC) Inject 25 Units under the skin at bedtime daily.   ? metoprolol tartrate (LOPRESSOR) 25 mg tablet TAKE ONE TABLET BY MOUTH TWICE A DAY   ? nitroglycerin (NITROSTAT) 0.4 mg tablet DISSOLVE 1 TAB UNDER TONGUE FOR CHEST PAIN - IF PAIN REMAINS AFTER 5 MIN, CALL 911 AND REPEAT DOSE. MAX 3 TABS IN 15 MINUTES   ? NOVOLOG FLEXPEN 100 unit/mL injection PEN three Units. Indications: 3 units at??lunch, 18 units with dinner   ? pantoprazole DR (PROTONIX) 20 mg tablet Take one tablet by mouth daily.   ? travoprost (TRAVATAN Z) 0.004 % ophthalmic solution Apply one drop to both eyes at bedtime daily.

## 2021-10-23 ENCOUNTER — Encounter: Admit: 2021-10-23 | Discharge: 2021-10-23 | Payer: MEDICARE

## 2021-10-23 ENCOUNTER — Ambulatory Visit: Admit: 2021-10-23 | Discharge: 2021-10-23 | Payer: MEDICARE

## 2021-10-23 DIAGNOSIS — I6522 Occlusion and stenosis of left carotid artery: Secondary | ICD-10-CM

## 2021-10-23 LAB — POC CREATININE, RAD: CREATININE, POC: 1.1 mg/dL (ref 0.4–1.24)

## 2021-10-23 MED ORDER — IOHEXOL 350 MG IODINE/ML IV SOLN
80 mL | Freq: Once | INTRAVENOUS | 0 refills | Status: CP
Start: 2021-10-23 — End: ?
  Administered 2021-10-23: 21:00:00 80 mL via INTRAVENOUS

## 2021-10-23 MED ORDER — SODIUM CHLORIDE 0.9 % IJ SOLN
50 mL | Freq: Once | INTRAVENOUS | 0 refills | Status: CP
Start: 2021-10-23 — End: ?
  Administered 2021-10-23: 21:00:00 50 mL via INTRAVENOUS

## 2021-10-26 ENCOUNTER — Encounter: Admit: 2021-10-26 | Discharge: 2021-10-26 | Payer: MEDICARE

## 2021-10-26 DIAGNOSIS — I6529 Occlusion and stenosis of unspecified carotid artery: Secondary | ICD-10-CM

## 2021-10-26 NOTE — Telephone Encounter
From: Dewitt Rota, DO   Sent: 10/23/2021  4:37 PM CST   To: Harley Alto, MD, Bishop Limbo, RN     Hello!   I would be happy to. I am out next week, but have an add-on clinic April 5. Eleanor Dimichele, can you please schedule?     Thanks!   Diannia Ruder   ----- Message -----   From: Harley Alto, MD   Sent: 10/23/2021  4:21 PM CST   To: Dewitt Rota, DO, Geronimo Boot, RN     Kara     May I request you to see this patient for a CEA for asymptomatic but progressive right ICA stenosis. He has all risk well controlled and yet has progressed from no stenosis to 99%.     Thanks     [employer] patients spouse Dois Davenport. Was able to assist in scheduling an OV with Dr. Lourdes Sledge to discuss carotid disease. Verified appt date, time, and location with Dois Davenport.

## 2021-10-28 ENCOUNTER — Encounter: Admit: 2021-10-28 | Discharge: 2021-10-28 | Payer: MEDICARE

## 2021-10-28 ENCOUNTER — Ambulatory Visit: Admit: 2021-10-28 | Discharge: 2021-10-29 | Payer: MEDICARE

## 2021-10-28 DIAGNOSIS — R9439 Abnormal result of other cardiovascular function study: Secondary | ICD-10-CM

## 2021-10-28 DIAGNOSIS — I2510 Atherosclerotic heart disease of native coronary artery without angina pectoris: Secondary | ICD-10-CM

## 2021-10-28 DIAGNOSIS — Z8639 Personal history of other endocrine, nutritional and metabolic disease: Secondary | ICD-10-CM

## 2021-10-28 DIAGNOSIS — I10 Essential (primary) hypertension: Secondary | ICD-10-CM

## 2021-10-28 DIAGNOSIS — I495 Sick sinus syndrome: Secondary | ICD-10-CM

## 2021-10-28 DIAGNOSIS — E785 Hyperlipidemia, unspecified: Secondary | ICD-10-CM

## 2021-10-28 DIAGNOSIS — I6522 Occlusion and stenosis of left carotid artery: Secondary | ICD-10-CM

## 2021-10-28 DIAGNOSIS — T50905A Adverse effect of unspecified drugs, medicaments and biological substances, initial encounter: Secondary | ICD-10-CM

## 2021-10-28 DIAGNOSIS — E119 Type 2 diabetes mellitus without complications: Secondary | ICD-10-CM

## 2021-10-28 DIAGNOSIS — I480 Paroxysmal atrial fibrillation: Secondary | ICD-10-CM

## 2021-10-28 DIAGNOSIS — Z950 Presence of cardiac pacemaker: Secondary | ICD-10-CM

## 2021-10-28 DIAGNOSIS — I208 Other forms of angina pectoris: Secondary | ICD-10-CM

## 2021-10-28 NOTE — Patient Instructions
Thank you for visiting our office today.   We recommend follow-up with our office in 4 months.     Take your medications as ordered.   Check your list with what you have on hand at home.   Should you have any additional questions or concerns, please message me through MyChart or call the office.    Cardiovascular Medicine Team   Clinic phone: [phone]

## 2021-10-30 ENCOUNTER — Encounter: Admit: 2021-10-30 | Discharge: 2021-10-30 | Payer: MEDICARE

## 2021-10-30 DIAGNOSIS — T82110D Breakdown (mechanical) of cardiac electrode, subsequent encounter: Secondary | ICD-10-CM

## 2021-10-30 MED ORDER — CEFAZOLIN INJ 1GM IVP
2 g | Freq: Once | INTRAVENOUS | 0 refills
Start: 2021-10-30 — End: ?

## 2021-10-30 MED ORDER — LIDOCAINE (PF) 10 MG/ML (1 %) IJ SOLN
.2 mL | INTRAMUSCULAR | 0 refills | PRN
Start: 2021-10-30 — End: ?

## 2021-10-30 MED ORDER — SODIUM CHLORIDE 0.9 % IV SOLP
750 mL | Freq: Once | INTRAVENOUS | 0 refills
Start: 2021-10-30 — End: ?

## 2021-11-10 ENCOUNTER — Encounter: Admit: 2021-11-10 | Discharge: 2021-11-10 | Payer: MEDICARE

## 2021-11-10 DIAGNOSIS — I2510 Atherosclerotic heart disease of native coronary artery without angina pectoris: Secondary | ICD-10-CM

## 2021-11-10 DIAGNOSIS — I208 Other forms of angina pectoris: Secondary | ICD-10-CM

## 2021-11-10 DIAGNOSIS — I10 Essential (primary) hypertension: Secondary | ICD-10-CM

## 2021-11-10 DIAGNOSIS — T82110A Breakdown (mechanical) of cardiac electrode, initial encounter: Secondary | ICD-10-CM

## 2021-11-10 DIAGNOSIS — T50905A Adverse effect of unspecified drugs, medicaments and biological substances, initial encounter: Secondary | ICD-10-CM

## 2021-11-10 DIAGNOSIS — E119 Type 2 diabetes mellitus without complications: Secondary | ICD-10-CM

## 2021-11-10 DIAGNOSIS — I7789 Other specified disorders of arteries and arterioles: Secondary | ICD-10-CM

## 2021-11-10 DIAGNOSIS — R9439 Abnormal result of other cardiovascular function study: Secondary | ICD-10-CM

## 2021-11-10 DIAGNOSIS — I6529 Occlusion and stenosis of unspecified carotid artery: Secondary | ICD-10-CM

## 2021-11-10 DIAGNOSIS — E785 Hyperlipidemia, unspecified: Secondary | ICD-10-CM

## 2021-11-10 DIAGNOSIS — R011 Cardiac murmur, unspecified: Secondary | ICD-10-CM

## 2021-11-10 DIAGNOSIS — I480 Paroxysmal atrial fibrillation: Secondary | ICD-10-CM

## 2021-11-10 DIAGNOSIS — I495 Sick sinus syndrome: Secondary | ICD-10-CM

## 2021-11-10 NOTE — Progress Notes
Cardiac Navigation Intake Assessment Document    Patient Name:  Marc Nelson  MRN:  [id number]  DOB:  1945-08-31  Insurance:   Payor: MEDICARE / Plan: MEDICARE PART A AND B / Product Type: Medicare /     Appointment Info:   Future Appointments   Date Time Provider Department Center   11/13/2021  7:00 AM PF LAB SCHEDULE A [id number] None   11/13/2021  8:30 AM CT-[facility] MO[facility]AT [facility] Radiolog   11/13/2021  9:30 AM Danter, Lesia Sago, MD MATCSKUMCCL CTS   11/13/2021 12:30 PM [place] [facility] CVM Procedur   11/13/2021 12:45 PM Janalyn Rouse, MD MACKUCL CVM Exam   11/13/2021  2:20 PM PAC ROOM 6 PRE None   11/18/2021  1:45 PM Hessel, Carmelina Dane, DO [id number] Surgery   12/08/2021  8:00 AM Parke Simmers, APRN-NP MACLIBCL CVM Exam   03/03/2022  8:30 AM Myriam Jacobson, MD MACLANLVCL CVM Exam         Diagnosis & Reason for Visit:  Lead extraction    Physician Info:   ? Referring Physician(Cardiologist):  Zollie Pee, MD  ? PCP:  Nechama Guard, MD  ? Diabetes management:   Martyn Ehrich, MD  ? Interventional Cardiology: Myriam Jacobson, MD    Location of Films:  IN HOUSE    History of Present Illness:  76 year old male referred to CTS for lead extraction. RN spoke with pt's wife who states pt has a lead fracture. OR date 12/15/21  Of note- patient has carotid stenosis and needs carotid endarterectomy. He was cleared by cardiology to proceed with surgery. Has consult scheduled with Vascular surgery on 4/5.       08/21/21 Stress test SUMMARY/OPINION:??This study is probably normal with no evidence of significant myocardial ischemia.  There is a small sized, mild intensity, reversible perfusion defect in the apical inferior wall that is more prominent on the stress upright images compared to the stress supine images.  There is no associated regional wall motion abnormality.  This is likely suggestive of diaphragmatic attenuation artifact.  Left ventricular systolic function is normal. There are no high risk prognostic indicators present.  The pharmacologic ECG portion of the study is nondiagnostic for ischemia.  ?  Comparison is made with a prior study completed 11/22/2019.  Ejection fraction was 67% with a left ventricular end-diastolic volume of 67 mL.  The prior stress only study was normal with no evidence of significant myocardial ischemia.  ?     09/18/21 Echo ?  ? The left ventricular systolic function is normal. The visually estimated ejection fraction is 65%.  ? Grade I (mild) left ventricular diastolic dysfunction. Normal left atrial pressure.  ? The right ventricle is mildly dilated with preserved systolic function. Pacemaker lead present in the ventricle.  ? Biatrial dilatation (LA-moderately dilated, RA-mildly dilated).  ? There is mild mitral annular calcification without stenosis.  ? Trace mitral valve regurgitation, mild tricuspid valve regurgitation.  ? Severely sclerotic aortic valve, mild stenosis (MG ~ 14 mmHg, peak velocity= 2.2 m/sec, DI= 0.41 , AVA= 1.4 cm?), trace regurgitation.  ? Estimated Peak Systolic PA Pressure 33 mmHg     Compared to the study dated 11/25/2020: LVEF 60-65%, RV was normal in size with mildly reduced systolic function, sclerotic aortic valve with slightly reduced systolic excursion, MG = 10.5 mmHg, PAP = 32 mmHg.     09/18/21 Carotid US 1. Mild-moderate atheromatous plaque visualized in bilateral common and internal carotid arteries.  2. The proximal  internal carotid artery has severe heterogeneous plaque with turbulent flow and hemodynamic evidence suggestive of 80-99% stenosis.  Monophasic flow distal to the stenosis. (Severity of stenosis increased compared to prior study 08/23/2011)  3. There is normal antegrade flow in bilateral vertebral arteries  4. No evidence of proximal subclavian stenosis?bilaterally  ?  Critical result reported to ordering physician.  ?   10/23/21 CTA neck 1. Calcified atheromatous plaque in proximal left internal carotid artery with 99% stenosis of the distal bulb.   2. Atheromatous irregularity of the proximal right internal carotid artery with 50% stenosis.   3. Calcified plaque in proximal right vertebral artery with greater than 90% stenosis.   4. 50% stenosis proximal left vertebral artery.    10/23/21 CTA head 1. Mild cerebral atrophy.   2. Mild deep white matter hypodensity consistent microvascular gliosis.   3. Atheromatous irregularity of internal carotid arteries.   2. Normal caliber of the remaining major intracranial arteries.    11/09/21  Incoming referral received.     Previous ECG: AV paced, Rate 62    Medical history(Pertinent to valve workup) : Afib and SSS s/p PPM,  Asthma,  CAD s/p CABG, Carotid stenosis, DM II, Dyslipidemia, PM lead fracture    Surgery/Procedure history (Pertinent to valve workup) : CABG (2013 at [facility],)  Carotid US, CTA head/neck,  Echo, PPM, Stress test    Reported heart failure symptoms: Sob with exertion.  Denies edema and chest pain.      NEEDS Assessment:                             ? Social Work/Financial:  No need identified                      ? Physical:  Independent with occasional cane use                                  ? Communication:  No needs identified                        Plan: Office visit for lead extraction    Creatinine clearance: 65.7   (Weight: 81.8 kg, Creatinine: 1.08)    Comments:  11/10/2021 11:43 AM RN spoke with patient. Updated on current plan. Determined the best date for an appointment. Educated to all appointment information.  Sheliah Plane, RN

## 2021-11-11 ENCOUNTER — Encounter: Admit: 2021-11-11 | Discharge: 2021-11-11 | Payer: MEDICARE

## 2021-11-11 NOTE — Progress Notes
Patient scheduled for RA lead explant & re-implant on 5/2 w/ [facility].    Pre-procedure lab orders faxed to [facility] @ [phone] per patient's wife's request.

## 2021-11-13 ENCOUNTER — Ambulatory Visit: Admit: 2021-11-13 | Discharge: 2021-11-13 | Payer: MEDICARE

## 2021-11-13 ENCOUNTER — Encounter: Admit: 2021-11-13 | Discharge: 2021-11-13 | Payer: MEDICARE

## 2021-11-13 DIAGNOSIS — T82110A Breakdown (mechanical) of cardiac electrode, initial encounter: Secondary | ICD-10-CM

## 2021-11-13 DIAGNOSIS — E785 Hyperlipidemia, unspecified: Secondary | ICD-10-CM

## 2021-11-13 DIAGNOSIS — T50905A Adverse effect of unspecified drugs, medicaments and biological substances, initial encounter: Secondary | ICD-10-CM

## 2021-11-13 DIAGNOSIS — I10 Essential (primary) hypertension: Secondary | ICD-10-CM

## 2021-11-13 DIAGNOSIS — T82110D Breakdown (mechanical) of cardiac electrode, subsequent encounter: Secondary | ICD-10-CM

## 2021-11-13 DIAGNOSIS — K219 Gastro-esophageal reflux disease without esophagitis: Secondary | ICD-10-CM

## 2021-11-13 DIAGNOSIS — E119 Type 2 diabetes mellitus without complications: Secondary | ICD-10-CM

## 2021-11-13 DIAGNOSIS — I7789 Other specified disorders of arteries and arterioles: Secondary | ICD-10-CM

## 2021-11-13 DIAGNOSIS — U071 COVID-19: Secondary | ICD-10-CM

## 2021-11-13 DIAGNOSIS — I6529 Occlusion and stenosis of unspecified carotid artery: Secondary | ICD-10-CM

## 2021-11-13 DIAGNOSIS — I480 Paroxysmal atrial fibrillation: Secondary | ICD-10-CM

## 2021-11-13 DIAGNOSIS — Z136 Encounter for screening for cardiovascular disorders: Secondary | ICD-10-CM

## 2021-11-13 DIAGNOSIS — I208 Other forms of angina pectoris: Secondary | ICD-10-CM

## 2021-11-13 DIAGNOSIS — R011 Cardiac murmur, unspecified: Secondary | ICD-10-CM

## 2021-11-13 DIAGNOSIS — I495 Sick sinus syndrome: Secondary | ICD-10-CM

## 2021-11-13 DIAGNOSIS — Z950 Presence of cardiac pacemaker: Secondary | ICD-10-CM

## 2021-11-13 DIAGNOSIS — I2510 Atherosclerotic heart disease of native coronary artery without angina pectoris: Secondary | ICD-10-CM

## 2021-11-13 DIAGNOSIS — H9190 Unspecified hearing loss, unspecified ear: Secondary | ICD-10-CM

## 2021-11-13 DIAGNOSIS — R001 Bradycardia, unspecified: Secondary | ICD-10-CM

## 2021-11-13 DIAGNOSIS — E118 Type 2 diabetes mellitus with unspecified complications: Secondary | ICD-10-CM

## 2021-11-13 DIAGNOSIS — J45909 Unspecified asthma, uncomplicated: Secondary | ICD-10-CM

## 2021-11-13 DIAGNOSIS — R9439 Abnormal result of other cardiovascular function study: Secondary | ICD-10-CM

## 2021-11-13 DIAGNOSIS — Z951 Presence of aortocoronary bypass graft: Secondary | ICD-10-CM

## 2021-11-13 DIAGNOSIS — R079 Chest pain, unspecified: Secondary | ICD-10-CM

## 2021-11-13 DIAGNOSIS — R0602 Shortness of breath: Secondary | ICD-10-CM

## 2021-11-13 DIAGNOSIS — D696 Thrombocytopenia, unspecified: Secondary | ICD-10-CM

## 2021-11-13 NOTE — Progress Notes
Date of Service: 11/13/2021       Subjective:             [name] is a 76 y.o. male.      History of Present Illness  Thank you for the referral of your pleasant patient Marc Nelson. As you know Marc Nelson is a 76 y.o. male who you have been following for atrial lead malfunction. He has a history of a.fib, asthma, CAD s/p CABG at [facility] with Dr. Farris Has in 2013, SSS post CABG and PPM placed at that time,  carotid stenosis (seeing vascular on 11/18/21 for discussion), DM II, and dyslipidemia. He follows with Dr. Bufford Buttner and has seen Dr. Harley Alto for his carotid stenosis. We have been asked to be available at the time of his lead extraction on 12/15/21.    Overall his symptoms have included ongoing and unchanged SOB with exertion.    CT chest today does not have final report. EP cardiology will follow up with him on these results when he is here for his extraction.     PFTs today revealed an FEV1 of 92% predicted and DLCO of 62% predicted.    CTA head/neck 10/23/2021 revealed:  1. Calcified atheromatous plaque in proximal left internal carotid artery   with 99% stenosis of the distal bulb.   2. Atheromatous irregularity of the proximal right internal carotid artery   with 50% stenosis.   3. Calcified plaque in proximal right vertebral artery with greater than   90% stenosis.   4. 50% stenosis proximal left vertebral artery.   1. Mild cerebral atrophy.   2. Mild deep white matter hypodensity consistent microvascular gliosis.   3. Atheromatous irregularity of internal carotid arteries.   2. Normal caliber of the remaining major intracranial arteries.     Echo 09/18/2021 revealed:  ? The left ventricular systolic function is normal. The visually estimated ejection fraction is 65%.  ? Grade I (mild) left ventricular diastolic dysfunction. Normal left atrial pressure.  ? The right ventricle is mildly dilated with preserved systolic function. Pacemaker lead present in the ventricle.  ? Biatrial dilatation (LA-moderately dilated, RA-mildly dilated).  ? There is mild mitral annular calcification without stenosis.  ? Trace mitral valve regurgitation, mild tricuspid valve regurgitation.  ? Severely sclerotic aortic valve, mild stenosis (MG ~ 14 mmHg, peak velocity= 2.2 m/sec, DI= 0.41 , AVA= 1.4 cm?), trace regurgitation.  ? Estimated Peak Systolic PA Pressure 33 mmHg    Nuclear stress test 08/21/2021 revealed:  Left Ventricular Ejection Fraction  =? 61 %.   Left Ventricular End Diastolic Volume: 80 mL  SUMMARY/OPINION:??This study is probably normal with no evidence of significant myocardial ischemia.  There is a small sized, mild intensity, reversible perfusion defect in the apical inferior wall that is more prominent on the stress upright images compared to the stress supine images.  There is no associated regional wall motion abnormality.  This is likely suggestive of diaphragmatic attenuation artifact.  Left ventricular systolic function is normal. There are no high risk prognostic indicators present.  The pharmacologic ECG portion of the study is nondiagnostic for ischemia.  Comparison is made with a prior study completed 11/22/2019.  Ejection fraction was 67% with a left ventricular end-diastolic volume of 67 mL.  The prior stress only study was normal with no evidence of significant myocardial ischemia.  In aggregate the current study is low risk in regards to predicted annual cardiovascular mortality rate.   ?  Imaging  was reviewed at length today by Dr. Jill Alexanders. These results were discussed with the patient at length.  He is here today to discuss possible surgical interventions.            Review of Systems   Constitutional: Negative.   HENT: Negative.    Eyes: Negative.    Cardiovascular: Negative.    Respiratory: Positive for shortness of breath.    Endocrine: Negative.    Hematologic/Lymphatic: Negative.    Skin: Negative.    Musculoskeletal: Negative.    Gastrointestinal: Negative.    Genitourinary: Negative.    Neurological: Negative.    Psychiatric/Behavioral: Negative.    Allergic/Immunologic: Negative.      Medical History:   Diagnosis Date   ? Abnormal stress test    ? Adverse drug reaction    ? 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 CAD (coronary artery disease) 08/23/2011   ? Carotid stenosis    ? Diabetes mellitus (HCC)    ? DM II (diabetes mellitus, type II), controlled (HCC) 10/22/2009   ? Dyslipidemia 10/22/2009   ? Heart murmur    ? HLD (hyperlipidemia)    ? HTN (hypertension)    ? Other specified disorders of arteries and arterioles (HCC)    ? Pacemaker lead fracture    ? Paroxysmal A-fib (HCC) 09/03/2011   ? SSS (sick sinus syndrome) [facility])      Surgical History:   Procedure Laterality Date   ? 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/24/2016    Performed by Zollie Pee, MD at [facility] CATH LAB   ? Fluoroscopy N/A 03/24/2016    Performed by Zollie Pee, MD at [facility] CATH LAB   ? Removal Implantable Loop Recorder  03/24/2016    Performed by Zollie Pee, MD at [facility] CATH LAB   ? CARDIAC CATHERIZATION     ? CARDIOVASCULAR STRESS TEST     ? COLONOSCOPY     ? DOPPLER ECHOCARDIOGRAPHY     ? ELECTROCARDIOGRAM     ? FINGER SURGERY      right hand 5th finger laceration repair   ? HX CORONARY ARTERY BYPASS GRAFT      2013 @ [facility]   ? HX HEART CATHETERIZATION     ? HX HERNIA REPAIR      left   ? HX KNEE SURGERY      right   ? HX ROTATOR CUFF REPAIR      bilateral   ? LEFT HEART CATHETERIZATION     ? MYOCARDIAL PERFUSION IMG STUDY     ? RHYTHM DEVICE PLACEMENT      PPM at [facility] in 2017   ? US DUPLEX SCAN CAROTID BILATERAL       Social History     Socioeconomic History   ? Marital status: Married   Tobacco Use   ? Smoking status: Never   ? Smokeless tobacco: Never   Substance and Sexual Activity   ? Alcohol use: No     Comment: 6 can per month   ? Drug use: No     Family History   Problem Relation Age of Onset   ? Cancer Mother         colon cancer   ? Hypertension Mother    ? Heart murmur Mother    ? Cancer Father         prostate   ? Hypertension Father    ? Stroke Father    ? Cancer-Prostate Father    ?  Heart murmur Brother    ? Cancer-Prostate Brother    ? Hypertension Brother          Objective:         ? aspirin EC 81 mg tablet Take one tablet by mouth daily. Take with food.   ? atorvastatin (LIPITOR) 80 mg tablet Take one tablet by mouth daily.   ? candesartan (ATACAND) 32 mg tablet TAKE ONE-HALF TABLET BY MOUTH DAILY   ? CETIRIZINE HCL (ZYRTEC PO) Take 1 Tab by mouth as Needed.   ? cholecalciferol (Vitamin D3) (VITAMIN D-3) 1,000 units tablet Take two tablets by mouth daily.     ? citalopram (CELEXA) 10 mg tablet Take one tablet by mouth daily.   ? Coenzyme Q10 150 mg cap Take 1 Cap by mouth daily. (Patient taking differently: Take 1.3333 capsules by mouth daily.)   ? cyanocobalamin (vitamin B-12) 1,000 mcg tablet Take one tablet by mouth daily.   ? dorzolamide (TRUSOPT) 2 % ophthalmic solution Apply one drop to both eyes daily.   ? ELIQUIS 5 mg tablet TAKE ONE TABLET BY MOUTH TWICE A DAY   ? ezetimibe (ZETIA) 10 mg tablet Take one tablet by mouth daily.   ? INSULIN GLARGINE,HUM.REC.ANLOG (LANTUS SC) Inject 35 Units under the skin at bedtime daily. Takes 35 units at bedtime   ? metoprolol tartrate (LOPRESSOR) 25 mg tablet TAKE ONE TABLET BY MOUTH TWICE A DAY   ? nitroglycerin (NITROSTAT) 0.4 mg tablet DISSOLVE 1 TAB UNDER TONGUE FOR CHEST PAIN - IF PAIN REMAINS AFTER 5 MIN, CALL 911 AND REPEAT DOSE. MAX 3 TABS IN 15 MINUTES   ? NOVOLOG FLEXPEN 100 unit/mL injection PEN three Units. Indications: 3 units at??lunch, 18 units with dinner   ? pantoprazole DR (PROTONIX) 20 mg tablet Take one tablet by mouth daily.   ? travoprost (TRAVATAN Z) 0.004 % ophthalmic solution Apply one drop to both eyes at bedtime daily.     Vitals:    11/13/21 0932   BP: 128/60   BP Source: Arm, Right Upper   Pulse: 71   SpO2: 95%   O2 Device: None (Room air)   PainSc: Zero   Weight: 82.1 kg (181 lb)   Height: 177.8 cm (5' 10) Body mass index is 25.97 kg/m?Marland Kitchen     Physical Exam  Constitutional:       Appearance: Normal appearance. He is normal weight.   Cardiovascular:      Rate and Rhythm: Normal rate and regular rhythm.      Pulses: Normal pulses.      Heart sounds: Normal heart sounds. No murmur heard.  Pulmonary:      Effort: Pulmonary effort is normal.      Breath sounds: Normal breath sounds.   Abdominal:      Palpations: Abdomen is soft.   Musculoskeletal:         General: Normal range of motion.      Right lower leg: No edema.      Left lower leg: No edema.      Comments: PPM left anterior chest wall   Skin:     General: Skin is warm and dry.   Neurological:      Mental Status: He is alert and oriented to person, place, and time.   Psychiatric:         Mood and Affect: Mood normal.         Behavior: Behavior normal.         Thought Content:  Thought content normal.         Judgment: Judgment normal.       Assessment:  1. Failure of pacemaker lead, initial encounter    2. Sinus node dysfunction (HCC)    3. Cardiac pacemaker in situ    4. Coronary artery disease involving native heart without angina pectoris, unspecified vessel or lesion type    5. S/P CABG (coronary artery bypass graft)    6. Stenosis of carotid artery, unspecified laterality    7. Paroxysmal A-fib (HCC)    8. HTN, goal below 140/90    9. Dyslipidemia    10. Controlled type 2 diabetes mellitus with complication, without long-term current use of insulin (HCC)    11. Thrombocytopenia (HCC)    12. Bradycardia    13. Hyperlipidemia, unspecified hyperlipidemia type          We will plan to be available for backup during his lead extraction on 12/15/21.    Jarome Lamas, APRN-NP  Thoracic & Cardiovascular Surgery  [phone]    11/13/2021            Assessment and Plan:  I agree with the very thorough consult note provided by Casimer Lanius, NP and have nothing to add the history, physical review of systems.  Briefly, Marc Nelson is a very pleasant 76 year old veteran who we are seeing today to discuss the risks and benefits of surgical backup for his upcoming lead extraction.  He has a fractured lead.  I see no barriers to moving forward with his lead extraction as planned and we will provide backup for this procedure.  We discussed the risks of cardiac perforation and need for surgical intervention which are low, and what the details of the required surgical interventions may be.  The patient was well informed and wishes to proceed.  If there are any surgical issues arise in the interim please not hesitate contact me.

## 2021-11-13 NOTE — Pre-Anesthesia Patient Instructions
Treating low blood sugar (hypoglycemia) in the preoperative patient:

## 2021-11-14 ENCOUNTER — Encounter: Admit: 2021-11-14 | Discharge: 2021-11-14 | Payer: MEDICARE

## 2021-11-14 DIAGNOSIS — T50905A Adverse effect of unspecified drugs, medicaments and biological substances, initial encounter: Secondary | ICD-10-CM

## 2021-11-14 DIAGNOSIS — E785 Hyperlipidemia, unspecified: Secondary | ICD-10-CM

## 2021-11-14 DIAGNOSIS — I2510 Atherosclerotic heart disease of native coronary artery without angina pectoris: Secondary | ICD-10-CM

## 2021-11-14 DIAGNOSIS — I7789 Other specified disorders of arteries and arterioles: Secondary | ICD-10-CM

## 2021-11-14 DIAGNOSIS — R011 Cardiac murmur, unspecified: Secondary | ICD-10-CM

## 2021-11-14 DIAGNOSIS — I10 Essential (primary) hypertension: Secondary | ICD-10-CM

## 2021-11-14 DIAGNOSIS — U071 COVID-19: Secondary | ICD-10-CM

## 2021-11-14 DIAGNOSIS — T82110A Breakdown (mechanical) of cardiac electrode, initial encounter: Secondary | ICD-10-CM

## 2021-11-14 DIAGNOSIS — I6529 Occlusion and stenosis of unspecified carotid artery: Secondary | ICD-10-CM

## 2021-11-14 DIAGNOSIS — H9190 Unspecified hearing loss, unspecified ear: Secondary | ICD-10-CM

## 2021-11-14 DIAGNOSIS — R9439 Abnormal result of other cardiovascular function study: Secondary | ICD-10-CM

## 2021-11-14 DIAGNOSIS — J45909 Unspecified asthma, uncomplicated: Secondary | ICD-10-CM

## 2021-11-14 DIAGNOSIS — I480 Paroxysmal atrial fibrillation: Secondary | ICD-10-CM

## 2021-11-14 DIAGNOSIS — E119 Type 2 diabetes mellitus without complications: Secondary | ICD-10-CM

## 2021-11-14 DIAGNOSIS — K219 Gastro-esophageal reflux disease without esophagitis: Secondary | ICD-10-CM

## 2021-11-14 DIAGNOSIS — I495 Sick sinus syndrome: Secondary | ICD-10-CM

## 2021-11-14 DIAGNOSIS — I208 Other forms of angina pectoris: Secondary | ICD-10-CM

## 2021-11-18 ENCOUNTER — Encounter: Admit: 2021-11-18 | Discharge: 2021-11-18 | Payer: MEDICARE

## 2021-11-18 ENCOUNTER — Ambulatory Visit: Admit: 2021-11-18 | Discharge: 2021-11-19 | Payer: MEDICARE

## 2021-11-18 DIAGNOSIS — T82110A Breakdown (mechanical) of cardiac electrode, initial encounter: Secondary | ICD-10-CM

## 2021-11-18 DIAGNOSIS — U071 COVID-19: Secondary | ICD-10-CM

## 2021-11-18 DIAGNOSIS — E785 Hyperlipidemia, unspecified: Secondary | ICD-10-CM

## 2021-11-18 DIAGNOSIS — I10 Essential (primary) hypertension: Secondary | ICD-10-CM

## 2021-11-18 DIAGNOSIS — I495 Sick sinus syndrome: Secondary | ICD-10-CM

## 2021-11-18 DIAGNOSIS — E119 Type 2 diabetes mellitus without complications: Secondary | ICD-10-CM

## 2021-11-18 DIAGNOSIS — I7789 Other specified disorders of arteries and arterioles: Secondary | ICD-10-CM

## 2021-11-18 DIAGNOSIS — R011 Cardiac murmur, unspecified: Secondary | ICD-10-CM

## 2021-11-18 DIAGNOSIS — I208 Other forms of angina pectoris: Secondary | ICD-10-CM

## 2021-11-18 DIAGNOSIS — J45909 Unspecified asthma, uncomplicated: Secondary | ICD-10-CM

## 2021-11-18 DIAGNOSIS — I2510 Atherosclerotic heart disease of native coronary artery without angina pectoris: Secondary | ICD-10-CM

## 2021-11-18 DIAGNOSIS — R9439 Abnormal result of other cardiovascular function study: Secondary | ICD-10-CM

## 2021-11-18 DIAGNOSIS — I6529 Occlusion and stenosis of unspecified carotid artery: Secondary | ICD-10-CM

## 2021-11-18 DIAGNOSIS — K219 Gastro-esophageal reflux disease without esophagitis: Secondary | ICD-10-CM

## 2021-11-18 DIAGNOSIS — T50905A Adverse effect of unspecified drugs, medicaments and biological substances, initial encounter: Secondary | ICD-10-CM

## 2021-11-18 DIAGNOSIS — H9190 Unspecified hearing loss, unspecified ear: Secondary | ICD-10-CM

## 2021-11-18 DIAGNOSIS — I480 Paroxysmal atrial fibrillation: Secondary | ICD-10-CM

## 2021-11-19 ENCOUNTER — Encounter: Admit: 2021-11-19 | Discharge: 2021-11-19 | Payer: MEDICARE

## 2021-11-19 DIAGNOSIS — H9190 Unspecified hearing loss, unspecified ear: Secondary | ICD-10-CM

## 2021-11-19 DIAGNOSIS — I7789 Other specified disorders of arteries and arterioles: Secondary | ICD-10-CM

## 2021-11-19 DIAGNOSIS — I6529 Occlusion and stenosis of unspecified carotid artery: Secondary | ICD-10-CM

## 2021-11-19 DIAGNOSIS — T50905A Adverse effect of unspecified drugs, medicaments and biological substances, initial encounter: Secondary | ICD-10-CM

## 2021-11-19 DIAGNOSIS — R011 Cardiac murmur, unspecified: Secondary | ICD-10-CM

## 2021-11-19 DIAGNOSIS — U071 COVID-19: Secondary | ICD-10-CM

## 2021-11-19 DIAGNOSIS — J45909 Unspecified asthma, uncomplicated: Secondary | ICD-10-CM

## 2021-11-19 DIAGNOSIS — I2510 Atherosclerotic heart disease of native coronary artery without angina pectoris: Secondary | ICD-10-CM

## 2021-11-19 DIAGNOSIS — I10 Essential (primary) hypertension: Secondary | ICD-10-CM

## 2021-11-19 DIAGNOSIS — E119 Type 2 diabetes mellitus without complications: Secondary | ICD-10-CM

## 2021-11-19 DIAGNOSIS — I480 Paroxysmal atrial fibrillation: Secondary | ICD-10-CM

## 2021-11-19 DIAGNOSIS — T82110A Breakdown (mechanical) of cardiac electrode, initial encounter: Secondary | ICD-10-CM

## 2021-11-19 DIAGNOSIS — I208 Other forms of angina pectoris: Secondary | ICD-10-CM

## 2021-11-19 DIAGNOSIS — R9439 Abnormal result of other cardiovascular function study: Secondary | ICD-10-CM

## 2021-11-19 DIAGNOSIS — K219 Gastro-esophageal reflux disease without esophagitis: Secondary | ICD-10-CM

## 2021-11-19 DIAGNOSIS — I495 Sick sinus syndrome: Secondary | ICD-10-CM

## 2021-11-19 DIAGNOSIS — E785 Hyperlipidemia, unspecified: Secondary | ICD-10-CM

## 2021-11-24 ENCOUNTER — Encounter: Admit: 2021-11-24 | Discharge: 2021-11-24 | Payer: MEDICARE

## 2021-11-24 NOTE — Progress Notes
Medicare is listed as patient's primary insurance coverage.  Pre-certification is not required for hospitalizations.

## 2021-11-26 ENCOUNTER — Encounter: Admit: 2021-11-26 | Discharge: 2021-11-26 | Payer: MEDICARE

## 2021-11-27 ENCOUNTER — Ambulatory Visit: Admit: 2021-11-27 | Discharge: 2021-11-27 | Payer: MEDICARE

## 2021-11-27 ENCOUNTER — Encounter: Admit: 2021-11-27 | Discharge: 2021-11-27 | Payer: MEDICARE

## 2021-11-27 ENCOUNTER — Inpatient Hospital Stay: Admit: 2021-11-27 | Payer: MEDICARE

## 2021-11-27 DIAGNOSIS — I6522 Occlusion and stenosis of left carotid artery: Secondary | ICD-10-CM

## 2021-11-27 DIAGNOSIS — R200 Anesthesia of skin: Secondary | ICD-10-CM

## 2021-11-27 LAB — CBC AND DIFF
ABSOLUTE BASO COUNT: 0 K/UL (ref 0–0.20)
ABSOLUTE EOS COUNT: 0.4 K/UL (ref 0–0.45)
ABSOLUTE MONO COUNT: 0.5 K/UL (ref 0–0.80)
ABSOLUTE NEUTROPHIL: 2.6 K/UL (ref 1.8–7.0)
BASOPHILS %: 0 % (ref 0–2)
LYMPHOCYTES %: 34 % (ref 24–44)
NEUTROPHILS %: 49 % (ref 41–77)
WBC COUNT: 5.5 K/UL (ref 4.5–11.0)

## 2021-11-27 LAB — MAGNESIUM: MAGNESIUM: 1.8 mg/dL — ABNORMAL LOW (ref 1.6–2.6)

## 2021-11-27 LAB — COMPREHENSIVE METABOLIC PANEL
AST: 23 U/L (ref 7–40)
BLD UREA NITROGEN: 22 mg/dL (ref 7–25)
CALCIUM: 9.8 mg/dL — ABNORMAL HIGH (ref 8.5–10.6)
CHLORIDE: 110 MMOL/L (ref 98–110)
CO2: 24 MMOL/L — ABNORMAL HIGH (ref 21–30)
CREATININE: 1 mg/dL (ref 0.4–1.24)
EGFR: >60 mL/min (ref >60)
GLUCOSE,PANEL: 240 mg/dL — ABNORMAL HIGH (ref 70–100)
SODIUM: 138 MMOL/L — ABNORMAL LOW (ref 137–147)
TOTAL BILIRUBIN: 0.4 mg/dL (ref 0.3–1.2)
TOTAL PROTEIN: 6.5 g/dL (ref 6.0–8.0)

## 2021-11-27 LAB — POC GLUCOSE
POC GLUCOSE: 200 mg/dL — ABNORMAL HIGH (ref 70–100)
POC GLUCOSE: 229 mg/dL — ABNORMAL HIGH (ref 70–100)

## 2021-11-27 MED ORDER — DEXTROSE 50 % IN WATER (D50W) IV SYRG
12.5-25 g | INTRAVENOUS | 0 refills | PRN
Start: 2021-11-27 — End: ?

## 2021-11-27 MED ORDER — DEXTROSE 50 % IN WATER (D50W) IV SYRG
12.5-25 g | INTRAVENOUS | 0 refills | Status: AC | PRN
Start: 2021-11-27 — End: ?

## 2021-11-27 MED ORDER — ASPIRIN 81 MG PO TBEC
81 mg | Freq: Every day | ORAL | 0 refills | Status: AC
Start: 2021-11-27 — End: ?
  Administered 2021-11-28 – 2021-12-03 (×5): 81 mg via ORAL

## 2021-11-27 MED ORDER — APIXABAN 5 MG PO TAB
5 mg | Freq: Two times a day (BID) | ORAL | 0 refills | Status: AC
Start: 2021-11-27 — End: ?
  Administered 2021-11-28 – 2021-11-29 (×4): 5 mg via ORAL

## 2021-11-27 MED ORDER — IOHEXOL 350 MG IODINE/ML IV SOLN
65 mL | Freq: Once | INTRAVENOUS | 0 refills | Status: CP
Start: 2021-11-27 — End: ?
  Administered 2021-11-28: 01:00:00 65 mL via INTRAVENOUS

## 2021-11-27 MED ORDER — DORZOLAMIDE 2 % OP DROP
1 [drp] | Freq: Two times a day (BID) | OPHTHALMIC | 0 refills | Status: AC
Start: 2021-11-27 — End: ?
  Administered 2021-11-28 – 2021-12-03 (×2): 1 [drp] via OPHTHALMIC

## 2021-11-27 MED ORDER — PANTOPRAZOLE 20 MG PO TBEC
20 mg | Freq: Every day | ORAL | 0 refills | Status: AC
Start: 2021-11-27 — End: ?
  Administered 2021-11-28 – 2021-12-03 (×5): 20 mg via ORAL

## 2021-11-27 MED ORDER — LATANOPROST 0.005 % OP DROP
1 [drp] | Freq: Two times a day (BID) | OPHTHALMIC | 0 refills | Status: AC
Start: 2021-11-27 — End: ?
  Administered 2021-11-28 – 2021-12-03 (×2): 1 [drp] via OPHTHALMIC

## 2021-11-27 MED ORDER — ATORVASTATIN 40 MG PO TAB
80 mg | Freq: Every evening | ORAL | 0 refills | Status: AC
Start: 2021-11-27 — End: ?
  Administered 2021-11-28 – 2021-12-03 (×6): 80 mg via ORAL

## 2021-11-27 MED ORDER — SODIUM CHLORIDE 0.9 % IJ SOLN
50 mL | Freq: Once | INTRAVENOUS | 0 refills | Status: CP
Start: 2021-11-27 — End: ?
  Administered 2021-11-28: 01:00:00 50 mL via INTRAVENOUS

## 2021-11-27 MED ORDER — CITALOPRAM 20 MG PO TAB
40 mg | Freq: Every day | ORAL | 0 refills | Status: AC
Start: 2021-11-27 — End: ?
  Administered 2021-11-28 – 2021-12-03 (×5): 40 mg via ORAL

## 2021-11-27 MED ORDER — EZETIMIBE 10 MG PO TAB
10 mg | Freq: Every day | ORAL | 0 refills | Status: AC
Start: 2021-11-27 — End: ?
  Administered 2021-11-28 – 2021-12-03 (×5): 10 mg via ORAL

## 2021-11-27 MED ORDER — METOPROLOL TARTRATE 25 MG PO TAB
25 mg | Freq: Two times a day (BID) | ORAL | 0 refills | Status: AC
Start: 2021-11-27 — End: ?
  Administered 2021-11-28 – 2021-12-02 (×8): 25 mg via ORAL

## 2021-11-27 MED ORDER — ACETAMINOPHEN 325 MG PO TAB
650 mg | ORAL | 0 refills | Status: AC | PRN
Start: 2021-11-27 — End: ?
  Administered 2021-11-28 – 2021-12-03 (×4): 650 mg via ORAL

## 2021-11-27 MED ORDER — INSULIN ASPART 100 UNIT/ML SC FLEXPEN
0-12 [IU] | Freq: Before meals | SUBCUTANEOUS | 0 refills | Status: AC
Start: 2021-11-27 — End: ?
  Administered 2021-11-27 – 2021-11-30 (×2): 2 [IU] via SUBCUTANEOUS

## 2021-11-27 MED ORDER — ACETAMINOPHEN 650 MG RE SUPP
650 mg | RECTAL | 0 refills | Status: AC | PRN
Start: 2021-11-27 — End: ?

## 2021-11-27 NOTE — Progress Notes
RT Adult Assessment Note    NAME:Marc Nelson             MRN: [id number]             DOB:Jul 25, 1946          AGE: 76 y.o.  ADMISSION DATE: 11/27/2021             DAYS ADMITTED: LOS: 0 days    RT Treatment Plan:     Criteria not met       Additional Comments:  Impressions of the patient: Patient resting in chair on room air   Intervention(s)/outcome(s): See above  Patient education that was completed: none  Recommendations to the care team: none    Vital Signs:  Pulse: 71  RR: 16 PER MINUTE  SpO2: 94 %  O2 Device: None (Room air)  Liter Flow:    O2%:    Breath Sounds:    Respiratory Effort:

## 2021-11-27 NOTE — Telephone Encounter
11/27/2021 11:19 AM   Received call from nurse with [facility] asking that we call patient to make an appointment.     Spoke with patient's wife she reports patient is in the hospital he was c/o tingling left side of face and blurred vision in the left eye, they are unable to do MRI due to pacemaker, she thinks they are doing an echocardiogram today and asked pt to follow up with cardiologist following discharge. She thinks pt may be discharged today. Appt made with Kennon Rounds next week for H&P and hospital f/u.

## 2021-11-27 NOTE — Telephone Encounter
Patient: Marc Nelson, DOB: 1946-04-30    Please send records from the last 12 months.    Please include the following:   Discharge Summary   EKG Images (not just report)   Echocardiogram   Cath report   Any other cardiac testing   Most recent lab report    Please fax to [facility] [phone].    Thank you!

## 2021-11-27 NOTE — Consults
Neurosurgery Consult History and Physical Note      Admission Date: 11/27/2021                                                LOS: 0 days    Reason for Consult:  evaluation for left CEA    Consult type: Co-Management w/Signed Orders    Consulting Physician: Neurosurgery Attendings: Rona Ravens, MD    Requesting Physician: Renaye Rakers, MD    Assessment/Plan:  Marc Nelson is a 76 y.o. male transferred to [place] from OSH for symptomatic L ICA stenosis, episode of left facial numbness and left eye blurry vision. CTA head demonstrated near occlusive stenosis in the left proximal ICA, and near 50% stenosis in the right proximal ICA.  Neurosurgery was consulted for surgical management.  Patient is neurologically intact on exam    PTA ASA and Eliquis    - No acute neurosurgical intervention   - Patient has established care with Dr. Emogene Morgan with vascular surgery    > Dr. Emogene Morgan was planning on L CEA after pacemaker leads were replaced in late May since at that time patient was asymptomatic from carotid stenosis   > Dr. 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 Macario Carls in the a.m., may defer to vascular surgery pending their availability    Varney Daily, MD   Neurological Surgery, PGY-1    Please page [phone] with questions.  _____________________________________________________________________    History of Present Illness:   Marc Nelson is a 76 y.o.   male h/o HTN, HLD, CAD s/p 5 vessel bypass, DM2 presented as transfer from [place], [place] ED for symptomatic left ICA stenosis.  He reports his symptoms began yesterday 4/13 AM, maybe around 8AM, with left eye blurry vision and shortness of breath with exertion.  He did not feel well throughout the morning while going [place] activities [place], and had his son drive him in his ATV.  He took a nap in the afternoon, then went to a local track meet.  He  complained to his wife at 3 PM about his left eye blurriness that had been intermittently bothering him throughout the day, but was also experiencing new onset left facial numbness involving the upper and lower face.  No changes in speech or weakness in his extremities, although he does endorse episodic word finding difficulties approximately weekly over the past year.  He had been told in the past that he had a vessel on the left side of his neck (L ICA) that is blocked 90% and to report to the ED for stroke like symptoms.  He is on eliquis and aspirin daily.      Patient has been seen by vascular surgery for evaluation of left carotid artery stenosis.     Past Medical History:  Medical History:   Diagnosis Date   ? Abnormal stress test    ? Acid reflux     tums prn   ? Adverse drug reaction    ? 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 Asthma    ?  CAD (coronary artery disease) 08/23/2011   ? Carotid stenosis    ? COVID-19 08/16/2021    no hosp, no SE   ? DM II (diabetes mellitus, type II), controlled (HCC) 10/22/2009    A1c: 06-2021 7.8    FBS:  ~120   ? Dyslipidemia 10/22/2009   ? Heart murmur    ? HLD (hyperlipidemia)    ? 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 HTN (hypertension)    ? Other specified disorders of arteries and arterioles (HCC)    ? Pacemaker lead fracture    ? Paroxysmal A-fib (HCC) 09/03/2011   ? SSS (sick sinus syndrome) [facility])        Past Surgical History:  Surgical History:   Procedure Laterality Date   ? HX CORONARY ARTERY BYPASS GRAFT  2013    @ [place], x 5 grafts   ? PACEMAKER INSERTION  10/2015    PPM at [place]--[facility]tr[employer]   ? 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/24/2016    Performed by Zollie Pee, MD at [facility] CATH LAB   ? Fluoroscopy N/A 03/24/2016    Performed by Zollie Pee, MD at [facility] CATH LAB ? Removal Implantable Loop Recorder  03/24/2016    Performed by Zollie Pee, MD at [facility] CATH LAB   ? KNEE REPLACEMENT Bilateral 06/2020    10-2020   ? CARDIAC CATHERIZATION     ? CARDIOVASCULAR STRESS TEST     ? CARPAL TUNNEL RELEASE Bilateral    ? CATARACT REMOVAL WITH IMPLANT     ? COLONOSCOPY     ? DOPPLER ECHOCARDIOGRAPHY     ? ELECTROCARDIOGRAM     ? FINGER SURGERY      right hand 5th finger laceration repair   ? HX HEART CATHETERIZATION     ? HX HERNIA REPAIR Left    ? HX KNEE SURGERY      right   ? HX ROTATOR CUFF REPAIR Bilateral    ? LEFT HEART CATHETERIZATION     ? MYOCARDIAL PERFUSION IMG STUDY     ? US DUPLEX SCAN CAROTID BILATERAL         Social History:  Social History     Tobacco Use   ? Smoking status: Never   ? Smokeless tobacco: Never   Substance Use Topics   ? Alcohol use: No   ? Drug use: No       Family History:  Family History   Problem Relation Age of Onset   ? Cancer Mother         colon cancer   ? Hypertension Mother    ? Heart murmur Mother    ? Cancer Father         prostate   ? Hypertension Father    ? Stroke Father    ? Cancer-Prostate Father    ? Heart murmur Brother    ? Cancer-Prostate Brother    ? Hypertension Brother        Allergies:    Metformin, Morphine, Oxycodone, and Ragweed    Medications:  PTA:  Current Outpatient Medications   Medication Instructions   ? apixaban (ELIQUIS) 5 mg, Oral, TWICE DAILY   ? aspirin EC 81 mg, Oral, DAILY, Take with food.    ? atorvastatin (LIPITOR) 80 mg, Oral, AT BEDTIME DAILY   ? candesartan (ATACAND) 32 mg tablet TAKE ONE-HALF TABLET BY MOUTH DAILY   ? cetirizine (ZYRTEC) 10 mg tablet 1  tablet, Oral, DAILY  PRN   ? CHOLEcalciferoL (vitamin D3) (VITAMIN D3) 1,000 Units, Oral, DAILY,     ? citalopram (CELEXA) 40 mg, Oral, DAILY   ? cyanocobalamin (vitamin B-12) 1,000 mcg, Oral, DAILY   ? dorzolamide (TRUSOPT) 2 % ophthalmic solution 1 drop, Both Eyes, TWICE DAILY   ? ezetimibe (ZETIA) 10 mg, Oral, DAILY   ? insulin glargine-yfgn (SEMGLEE PEN) 35 Units, Subcutaneous, AT BEDTIME DAILY   ? metoprolol tartrate (LOPRESSOR) 25 mg, Oral, TWICE DAILY   ? nitroglycerin (NITROSTAT) 0.4 mg tablet DISSOLVE 1 TAB UNDER TONGUE FOR CHEST PAIN - IF PAIN REMAINS AFTER 5 MIN, CALL 911 AND REPEAT DOSE. MAX 3 TABS IN 15 MINUTES   ? NovoLOG FlexPen U-100 Insulin 3 Units, Inject 8 units with breakfast, 8 units with lunch, and 15 units with supper   ? pantoprazole DR (PROTONIX) 20 mg, Oral, DAILY   ? travoprost (TRAVATAN Z) 0.004 % ophthalmic solution 1 drop, Both Eyes, TWICE DAILY   ? ubidecarenone (COENZYME Q10 PO) 1 capsule, Oral, DAILY        Inpatient:  Scheduled Meds:apixaban (ELIQUIS) tablet 5 mg, 5 mg, Oral, BID  [START ON 11/28/2021] aspirin EC tablet 81 mg, 81 mg, Oral, QDAY  atorvastatin (LIPITOR) tablet 80 mg, 80 mg, Oral, QHS  [START ON 11/28/2021] citalopram (CeleXA) tablet 40 mg, 40 mg, Oral, QDAY  dorzolamide (TRUSOPT) 2 % ophthalmic solution 1 drop, 1 drop, Both Eyes, BID  ezetimibe (ZETIA) tablet 10 mg, 10 mg, Oral, QDAY  insulin aspart (U-100) (NOVOLOG FLEXPEN U-100 INSULIN) injection PEN 0-12 Units, 0-12 Units, Subcutaneous, ACHS (22)  latanoprost (XALATAN) 0.005 % ophthalmic solution 1 drop, 1 drop, Both Eyes, BID  metoprolol tartrate tablet 25 mg, 25 mg, Oral, BID  [START ON 11/28/2021] pantoprazole DR (PROTONIX) tablet 20 mg, 20 mg, Oral, QDAY    Continuous Infusions:  PRN and Respiratory Meds:acetaminophen Q4H PRN **OR** acetaminophen Q4H PRN, dextrose 50% (D50) IV PRN      Review of Systems:  Full 10 point review of systems negative except for HPI    Physical Exam:  Vital Signs:  Last Filed in 24 hours Vital Signs:  24 hour Range    BP: 155/89 (04/14 1511)  Height: 177.8 cm (5' 10) (04/14 1511) BP: (155)/(89)      General appearance: No acute distress  Lungs: breathing comfortably on room air, equal bilateral chest rise  Heart: extremities warm and well perfused  Gastrointestinal: non-distended  Musculoskeletal: No edema, redness or tenderness in the calves or thighs  Skin: Integument intact without major lesion.  Psychiatric: Normal affect    Neurologic Exam:   Mental Status: Awake, alert and oriented x 4, fluent speech, normal cognition  Pupils: Pupils equal round and reactive to light  Cranial Nerves: CN II-XII individually tested and found to be intact, gag not tested, patient complaining of decreased vision in left eye, however visual fields grossly intact  Motor:   AA EF EE G HF KF KE DF PF   Left 5 5 5 5 5 5 5 5 5    Right 3 5 5 5 5 5 5 5 5    Normal muscle bulk and tone  No pronator drift  Sensation: Sensation intact to light touch throughout    LabTests:  Hematology:    Lab Results   Component Value Date    HGB 14.0 10/08/2019    HCT 41.9 10/08/2019    PLTCT 207 10/08/2019    WBC 6.3 10/08/2019  NEUT 38 05/03/2016    ANC 2.10 05/03/2016    ALC 2.60 05/03/2016    MONA 8 05/03/2016    AMC 0.50 05/03/2016    ABC 0.00 05/03/2016    MCV 100 10/08/2019    MCHC 33.5 10/08/2019    MPV 7.8 05/03/2016    RDW 12.6 09/29/2018     General Chemistry:   Lab Results   Component Value Date    NA 140 08/11/2021    NA 140 08/11/2021    K 5.1 08/11/2021    K 5.1 08/11/2021    CL 112 08/11/2021    CL 122 08/11/2021    CO2 22 08/11/2021    CO2 22 08/11/2021    BUN 24 08/11/2021    BUN 24 08/11/2021    CR 1.1 10/23/2021    CR 1.08 08/11/2021    CR 1.08 08/11/2021    GLU 141 08/11/2021    GLU 141 08/11/2021    CA 9.7 08/11/2021    CA 9.7 08/11/2021    MG 2.1 04/07/2020    PO4 3.13 10/08/2019     General Chemistry:   Lab Results   Component Value Date    GAP 6 08/11/2021    GAP 6 08/11/2021    ALBUMIN 3.9 03/03/2020    TOTBILI 0.5 03/03/2020    DBILI 0.0 04/29/2015    TOTPROT 7.0 03/03/2020    AST 23 03/03/2020    ALT 24 03/03/2020    ALKPHOS 86 03/03/2020       Radiology and other Diagnostics Review:    Pertinent radiology reviewed    Varney Daily, MD  11/27/2021  Neurological Surgery, PGY-1

## 2021-11-28 ENCOUNTER — Inpatient Hospital Stay: Admit: 2021-11-28 | Discharge: 2021-11-28 | Payer: MEDICARE

## 2021-11-29 ENCOUNTER — Encounter: Admit: 2021-11-29 | Discharge: 2021-11-29 | Payer: MEDICARE

## 2021-11-29 MED ADMIN — ARTIFICIAL TEARS (PF) SINGLE DOSE DROPS GROUP [280009]: 1 [drp] | OPHTHALMIC | @ 14:00:00 | NDC 00065806301

## 2021-11-30 ENCOUNTER — Inpatient Hospital Stay: Admit: 2021-11-30 | Discharge: 2021-11-30 | Payer: MEDICARE

## 2021-11-30 ENCOUNTER — Encounter: Admit: 2021-11-30 | Discharge: 2021-11-30 | Payer: MEDICARE

## 2021-11-30 DIAGNOSIS — I2510 Atherosclerotic heart disease of native coronary artery without angina pectoris: Secondary | ICD-10-CM

## 2021-11-30 DIAGNOSIS — R9439 Abnormal result of other cardiovascular function study: Secondary | ICD-10-CM

## 2021-11-30 DIAGNOSIS — H9190 Unspecified hearing loss, unspecified ear: Secondary | ICD-10-CM

## 2021-11-30 DIAGNOSIS — T82110A Breakdown (mechanical) of cardiac electrode, initial encounter: Secondary | ICD-10-CM

## 2021-11-30 DIAGNOSIS — T50905A Adverse effect of unspecified drugs, medicaments and biological substances, initial encounter: Secondary | ICD-10-CM

## 2021-11-30 DIAGNOSIS — I7789 Other specified disorders of arteries and arterioles: Secondary | ICD-10-CM

## 2021-11-30 DIAGNOSIS — I495 Sick sinus syndrome: Secondary | ICD-10-CM

## 2021-11-30 DIAGNOSIS — K219 Gastro-esophageal reflux disease without esophagitis: Secondary | ICD-10-CM

## 2021-11-30 DIAGNOSIS — E785 Hyperlipidemia, unspecified: Secondary | ICD-10-CM

## 2021-11-30 DIAGNOSIS — I10 Essential (primary) hypertension: Secondary | ICD-10-CM

## 2021-11-30 DIAGNOSIS — I480 Paroxysmal atrial fibrillation: Secondary | ICD-10-CM

## 2021-11-30 DIAGNOSIS — I208 Other forms of angina pectoris: Secondary | ICD-10-CM

## 2021-11-30 DIAGNOSIS — I6529 Occlusion and stenosis of unspecified carotid artery: Secondary | ICD-10-CM

## 2021-11-30 DIAGNOSIS — U071 COVID-19: Secondary | ICD-10-CM

## 2021-11-30 DIAGNOSIS — E119 Type 2 diabetes mellitus without complications: Secondary | ICD-10-CM

## 2021-11-30 DIAGNOSIS — R011 Cardiac murmur, unspecified: Secondary | ICD-10-CM

## 2021-11-30 DIAGNOSIS — J45909 Unspecified asthma, uncomplicated: Secondary | ICD-10-CM

## 2021-11-30 MED ADMIN — HEPARIN (PORCINE) IN 5 % DEX 20,000 UNIT/500 ML (40 UNIT/ML) IV SOLP [3628]: 1215 [IU]/h | INTRAVENOUS | @ 17:00:00 | NDC 00264956710

## 2021-11-30 MED ADMIN — HEPARIN (PORCINE) IN 5 % DEX 20,000 UNIT/500 ML (40 UNIT/ML) IV SOLP [3628]: 1215 [IU]/h | INTRAVENOUS | @ 02:00:00 | NDC 00264956710

## 2021-11-30 MED ADMIN — ARTIFICIAL TEARS (PF) SINGLE DOSE DROPS GROUP [280009]: 1 [drp] | OPHTHALMIC | @ 02:00:00 | NDC 00065806301

## 2021-11-30 MED ADMIN — ARTIFICIAL TEARS (PF) SINGLE DOSE DROPS GROUP [280009]: 1 [drp] | OPHTHALMIC | @ 13:00:00 | NDC 00065806301

## 2021-11-30 MED ADMIN — MAGNESIUM OXIDE 400 MG (241.3 MG MAGNESIUM) PO TAB [10491]: 400 mg | ORAL | @ 17:00:00 | Stop: 2021-11-30 | NDC 64980033912

## 2021-12-01 ENCOUNTER — Encounter: Admit: 2021-12-01 | Discharge: 2021-12-01 | Payer: MEDICARE

## 2021-12-01 ENCOUNTER — Inpatient Hospital Stay: Admit: 2021-12-01 | Discharge: 2021-12-01 | Payer: MEDICARE

## 2021-12-01 DIAGNOSIS — I6522 Occlusion and stenosis of left carotid artery: Secondary | ICD-10-CM

## 2021-12-01 MED ORDER — CEFAZOLIN IN 0.9% SOD CHLORIDE 2 GRAM/110 ML IVPB
2 g | Freq: Once | INTRAVENOUS | 0 refills
Start: 2021-12-01 — End: ?

## 2021-12-01 MED ADMIN — MAGNESIUM OXIDE 400 MG (241.3 MG MAGNESIUM) PO TAB [10491]: 400 mg | ORAL | @ 17:00:00 | Stop: 2021-12-01 | NDC 64980033912

## 2021-12-01 MED ADMIN — INSULIN GLARGINE 100 UNIT/ML (3 ML) SC INJ PEN [163596]: 16 [IU] | SUBCUTANEOUS | @ 04:00:00 | NDC 00088221901

## 2021-12-01 MED ADMIN — ARTIFICIAL TEARS (PF) SINGLE DOSE DROPS GROUP [280009]: 1 [drp] | OPHTHALMIC | @ 14:00:00 | NDC 00065806301

## 2021-12-01 MED ADMIN — HEPARIN (PORCINE) IN 5 % DEX 20,000 UNIT/500 ML (40 UNIT/ML) IV SOLP [3628]: 1215 [IU]/h | INTRAVENOUS | @ 09:00:00 | NDC 00264956710

## 2021-12-01 MED ADMIN — ARTIFICIAL TEARS (PF) SINGLE DOSE DROPS GROUP [280009]: 1 [drp] | OPHTHALMIC | @ 02:00:00 | NDC 00065806301

## 2021-12-02 ENCOUNTER — Encounter: Admit: 2021-12-02 | Discharge: 2021-12-02 | Payer: MEDICARE

## 2021-12-02 ENCOUNTER — Inpatient Hospital Stay: Admit: 2021-12-02 | Discharge: 2021-12-02 | Payer: MEDICARE

## 2021-12-02 MED ORDER — PHENYLEPHRINE HCL IN 0.9% NACL 1 MG/10 ML (100 MCG/ML) IV SYRG
INTRAVENOUS | 0 refills | Status: DC
Start: 2021-12-02 — End: 2021-12-02
  Administered 2021-12-02: 17:00:00 150 ug via INTRAVENOUS
  Administered 2021-12-02 (×2): 100 ug via INTRAVENOUS
  Administered 2021-12-02 (×3): 150 ug via INTRAVENOUS

## 2021-12-02 MED ORDER — PROTAMINE 10 MG/ML IV SOLN
INTRAVENOUS | 0 refills | Status: DC
Start: 2021-12-02 — End: 2021-12-02
  Administered 2021-12-02 (×2): 20 mg via INTRAVENOUS

## 2021-12-02 MED ORDER — HYDRALAZINE 20 MG/ML IJ SOLN
INTRAVENOUS | 0 refills | Status: DC
Start: 2021-12-02 — End: 2021-12-02
  Administered 2021-12-02 (×2): 2.5 mg via INTRAVENOUS
  Administered 2021-12-02: 16:00:00 5 mg via INTRAVENOUS

## 2021-12-02 MED ORDER — PROPOFOL 10 MG/ML IV EMUL 50 ML (INFUSION)(AM)(OR)
INTRAVENOUS | 0 refills | Status: DC
Start: 2021-12-02 — End: 2021-12-02
  Administered 2021-12-02: 15:00:00 75 ug/kg/min via INTRAVENOUS

## 2021-12-02 MED ORDER — LIDOCAINE (PF) 10 MG/ML (1 %) IJ SOLN
SUBCUTANEOUS | 0 refills | Status: CP
Start: 2021-12-02 — End: ?
  Administered 2021-12-02: 14:00:00 3 mL via SUBCUTANEOUS

## 2021-12-02 MED ORDER — MIDAZOLAM 1 MG/ML IJ SOLN
INTRAVENOUS | 0 refills | Status: CP
Start: 2021-12-02 — End: ?
  Administered 2021-12-02 (×2): 1 mg via INTRAVENOUS

## 2021-12-02 MED ORDER — LIDOCAINE (PF) 10 MG/ML (1 %) IJ SOLN
SUBCUTANEOUS | 0 refills | Status: CP
Start: 2021-12-02 — End: ?
  Administered 2021-12-02: 15:00:00 1 mL via SUBCUTANEOUS

## 2021-12-02 MED ORDER — HEPARIN (PORCINE) 1,000 UNIT/ML IJ SOLN
INTRAVENOUS | 0 refills | Status: DC
Start: 2021-12-02 — End: 2021-12-02
  Administered 2021-12-02: 16:00:00 8000 [IU] via INTRAVENOUS

## 2021-12-02 MED ORDER — FENTANYL CITRATE (PF) 50 MCG/ML IJ SOLN
INTRAVENOUS | 0 refills | Status: CP
Start: 2021-12-02 — End: ?
  Administered 2021-12-02 (×2): 25 ug via INTRAVENOUS

## 2021-12-02 MED ORDER — BUPIVACAINE HCL 0.5 % (5 MG/ML) IJ SOLN
0 refills | Status: CP
Start: 2021-12-02 — End: ?
  Administered 2021-12-02: 14:00:00 30 mL

## 2021-12-02 MED ORDER — NICARDIPINE 2.5 MG IN 10 ML NS (AM)(OR)
INTRAVENOUS | 0 refills | Status: DC
Start: 2021-12-02 — End: 2021-12-02
  Administered 2021-12-02 (×2): .25 mg via INTRAVENOUS

## 2021-12-02 MED ORDER — NICARDIPINE IN NACL (ISO-OS) 20 MG/200 ML IV PGBK (INFUSION)(AM)(OR)
INTRAVENOUS | 0 refills | Status: DC
Start: 2021-12-02 — End: 2021-12-02
  Administered 2021-12-02: 17:00:00 2.5 mg/h via INTRAVENOUS

## 2021-12-02 MED ADMIN — SODIUM CHLORIDE 0.9 % IV SOLP [27838]: 0.3 ug/kg/min | INTRAVENOUS | @ 19:00:00 | Stop: 2021-12-02 | NDC 00338004902

## 2021-12-02 MED ADMIN — LACTATED RINGERS IV SOLP [4318]: 500 mL | @ 17:00:00 | Stop: 2021-12-02 | NDC 00338011703

## 2021-12-02 MED ADMIN — LACTATED RINGERS IV SOLP [4318]: 1000.000 mL | INTRAVENOUS | @ 14:00:00 | Stop: 2021-12-02 | NDC 00338011704

## 2021-12-02 MED ADMIN — SODIUM CHLORIDE 0.9 % IR SOLN [11403]: 1000 mL | @ 17:00:00 | Stop: 2021-12-02 | NDC 00338004804

## 2021-12-02 MED ADMIN — LACTATED RINGERS IV SOLP [4318]: 1000.000 mL | INTRAVENOUS | @ 17:00:00 | Stop: 2021-12-02 | NDC 00338011704

## 2021-12-02 MED ADMIN — ALBUMIN, HUMAN 5 % IV SOLP [8982]: 250 mL | INTRAVENOUS | @ 20:00:00 | Stop: 2021-12-02 | NDC 68516521403

## 2021-12-02 MED ADMIN — ARTIFICIAL TEARS (PF) SINGLE DOSE DROPS GROUP [280009]: 1 [drp] | OPHTHALMIC | @ 02:00:00 | NDC 00065806301

## 2021-12-02 MED ADMIN — INSULIN ASPART 100 UNIT/ML SC FLEXPEN [87504]: 6 [IU] | SUBCUTANEOUS | @ 19:00:00 | Stop: 2021-12-02 | NDC 00169633910

## 2021-12-02 MED ADMIN — CEFAZOLIN IN 0.9% SOD CHLORIDE 2 GRAM/110 ML IVPB [213015]: 2 g | INTRAVENOUS | @ 15:00:00 | Stop: 2021-12-02 | NDC 54029373309

## 2021-12-02 MED ADMIN — HEPARIN (PORCINE) 1,000 UNIT/ML IJ SOLN [10176]: 500 mL | @ 17:00:00 | Stop: 2021-12-02 | NDC 63323054011

## 2021-12-02 MED ADMIN — PHENYLEPHRINE HCL 10 MG/ML IJ SOLN [6242]: 0.3 ug/kg/min | INTRAVENOUS | @ 19:00:00 | Stop: 2021-12-02 | NDC 70121157701

## 2021-12-02 MED ADMIN — CEFAZOLIN 1 GRAM IJ SOLR [1445]: 1000 mL | @ 17:00:00 | Stop: 2021-12-02 | NDC 00143992490

## 2021-12-02 MED ADMIN — HEPARIN (PORCINE) IN 5 % DEX 20,000 UNIT/500 ML (40 UNIT/ML) IV SOLP [3628]: 1115 [IU]/h | INTRAVENOUS | @ 05:00:00 | NDC 00264956710

## 2021-12-03 ENCOUNTER — Encounter: Admit: 2021-12-03 | Discharge: 2021-12-03 | Payer: MEDICARE

## 2021-12-03 MED ADMIN — SENNOSIDES 8.6 MG PO TAB [11349]: 2 | ORAL | @ 14:00:00 | Stop: 2021-12-03 | NDC 00904652261

## 2021-12-03 MED ADMIN — ARTIFICIAL TEARS (PF) SINGLE DOSE DROPS GROUP [280009]: 1 [drp] | OPHTHALMIC | @ 02:00:00 | NDC 00065806301

## 2021-12-03 MED ADMIN — SENNOSIDES 8.6 MG PO TAB [11349]: 2 | ORAL | @ 02:00:00 | NDC 49483008001

## 2021-12-03 MED ADMIN — OXYCODONE 5 MG PO TAB [10814]: 5 mg | ORAL | @ 08:00:00 | Stop: 2021-12-03 | NDC 00904696661

## 2021-12-03 MED ADMIN — OXYCODONE 5 MG PO TAB [10814]: 5 mg | ORAL | @ 14:00:00 | Stop: 2021-12-03 | NDC 00904696661

## 2021-12-03 MED ADMIN — MAGNESIUM SULFATE IN WATER 4 GRAM/50 ML (8 %) IV PGBK [166563]: 4 g | INTRAVENOUS | @ 11:00:00 | Stop: 2021-12-03 | NDC 63323010701

## 2021-12-03 MED ADMIN — LACTATED RINGERS IV SOLP [4318]: 1000 mL | INTRAVENOUS | @ 02:00:00 | Stop: 2021-12-03 | NDC 00338011704

## 2021-12-04 ENCOUNTER — Encounter: Admit: 2021-12-04 | Discharge: 2021-12-04 | Payer: MEDICARE

## 2021-12-04 DIAGNOSIS — I208 Other forms of angina pectoris: Secondary | ICD-10-CM

## 2021-12-04 DIAGNOSIS — H9190 Unspecified hearing loss, unspecified ear: Secondary | ICD-10-CM

## 2021-12-04 DIAGNOSIS — U071 COVID-19: Secondary | ICD-10-CM

## 2021-12-04 DIAGNOSIS — I10 Essential (primary) hypertension: Secondary | ICD-10-CM

## 2021-12-04 DIAGNOSIS — K219 Gastro-esophageal reflux disease without esophagitis: Secondary | ICD-10-CM

## 2021-12-04 DIAGNOSIS — T50905A Adverse effect of unspecified drugs, medicaments and biological substances, initial encounter: Secondary | ICD-10-CM

## 2021-12-04 DIAGNOSIS — I495 Sick sinus syndrome: Secondary | ICD-10-CM

## 2021-12-04 DIAGNOSIS — I2510 Atherosclerotic heart disease of native coronary artery without angina pectoris: Secondary | ICD-10-CM

## 2021-12-04 DIAGNOSIS — E785 Hyperlipidemia, unspecified: Secondary | ICD-10-CM

## 2021-12-04 DIAGNOSIS — E119 Type 2 diabetes mellitus without complications: Secondary | ICD-10-CM

## 2021-12-04 DIAGNOSIS — J45909 Unspecified asthma, uncomplicated: Secondary | ICD-10-CM

## 2021-12-04 DIAGNOSIS — T82110A Breakdown (mechanical) of cardiac electrode, initial encounter: Secondary | ICD-10-CM

## 2021-12-04 DIAGNOSIS — I7789 Other specified disorders of arteries and arterioles: Secondary | ICD-10-CM

## 2021-12-04 DIAGNOSIS — R011 Cardiac murmur, unspecified: Secondary | ICD-10-CM

## 2021-12-04 DIAGNOSIS — R9439 Abnormal result of other cardiovascular function study: Secondary | ICD-10-CM

## 2021-12-04 DIAGNOSIS — I480 Paroxysmal atrial fibrillation: Secondary | ICD-10-CM

## 2021-12-04 DIAGNOSIS — I6529 Occlusion and stenosis of unspecified carotid artery: Secondary | ICD-10-CM

## 2021-12-07 ENCOUNTER — Encounter: Admit: 2021-12-07 | Discharge: 2021-12-07 | Payer: MEDICARE

## 2021-12-07 NOTE — Telephone Encounter
-----   Message from Pearletha Furl, RN sent at 12/03/2021  3:37 PM CDT -----  Regarding: FW: SHS- wife called at 3pm. [place] Spaniel he was to have telehealth visit but had to be cancelled due to he was in the hospital. Do we need to reschedule it? Call her at #[phone].  Hi Marc Nelson! Pt's wife was calling regarding rescheduling H&P. Wasn't sure if this was something I could do for you or if you took care of everything. I know extractions are pretty hands off for me. Let me know what I can do to assist. I will call wife and let her know we will get back with them soon. Thank you!   ----- Message -----  From: Golda Acre, LPN  Sent: 3/41/9622   3:34 PM CDT  To: Cvm Nurse Ep Team B  Subject: SHS- wife called at 3pm. Said he was to have#

## 2021-12-15 ENCOUNTER — Encounter: Admit: 2021-12-15 | Discharge: 2021-12-15 | Payer: MEDICARE

## 2021-12-15 DIAGNOSIS — J45909 Unspecified asthma, uncomplicated: Secondary | ICD-10-CM

## 2021-12-15 DIAGNOSIS — I480 Paroxysmal atrial fibrillation: Secondary | ICD-10-CM

## 2021-12-15 DIAGNOSIS — R9439 Abnormal result of other cardiovascular function study: Secondary | ICD-10-CM

## 2021-12-15 DIAGNOSIS — U071 COVID-19: Secondary | ICD-10-CM

## 2021-12-15 DIAGNOSIS — H9190 Unspecified hearing loss, unspecified ear: Secondary | ICD-10-CM

## 2021-12-15 DIAGNOSIS — R011 Cardiac murmur, unspecified: Secondary | ICD-10-CM

## 2021-12-15 DIAGNOSIS — T50905A Adverse effect of unspecified drugs, medicaments and biological substances, initial encounter: Secondary | ICD-10-CM

## 2021-12-15 DIAGNOSIS — I10 Essential (primary) hypertension: Secondary | ICD-10-CM

## 2021-12-15 DIAGNOSIS — I6529 Occlusion and stenosis of unspecified carotid artery: Secondary | ICD-10-CM

## 2021-12-15 DIAGNOSIS — E785 Hyperlipidemia, unspecified: Secondary | ICD-10-CM

## 2021-12-15 DIAGNOSIS — I7789 Other specified disorders of arteries and arterioles: Secondary | ICD-10-CM

## 2021-12-15 DIAGNOSIS — T82110A Breakdown (mechanical) of cardiac electrode, initial encounter: Secondary | ICD-10-CM

## 2021-12-15 DIAGNOSIS — I495 Sick sinus syndrome: Secondary | ICD-10-CM

## 2021-12-15 DIAGNOSIS — K219 Gastro-esophageal reflux disease without esophagitis: Secondary | ICD-10-CM

## 2021-12-15 DIAGNOSIS — I2510 Atherosclerotic heart disease of native coronary artery without angina pectoris: Secondary | ICD-10-CM

## 2021-12-15 DIAGNOSIS — E119 Type 2 diabetes mellitus without complications: Secondary | ICD-10-CM

## 2021-12-15 DIAGNOSIS — I208 Other forms of angina pectoris: Secondary | ICD-10-CM

## 2021-12-15 MED ORDER — LABETALOL 5 MG/ML IV SYRG
INTRAVENOUS | 0 refills | Status: DC
Start: 2021-12-15 — End: 2021-12-15
  Administered 2021-12-15 (×2): 5 mg via INTRAVENOUS

## 2021-12-15 MED ORDER — PHENYLEPHRINE 40 MCG/ML IN NS IV DRIP (STD CONC)
INTRAVENOUS | 0 refills | Status: DC
Start: 2021-12-15 — End: 2021-12-15
  Administered 2021-12-15 (×2): .4 ug/kg/min via INTRAVENOUS

## 2021-12-15 MED ORDER — LIDOCAINE (PF) 200 MG/10 ML (2 %) IJ SYRG
INTRAVENOUS | 0 refills | Status: DC
Start: 2021-12-15 — End: 2021-12-15
  Administered 2021-12-15: 14:00:00 100 mg via INTRAVENOUS

## 2021-12-15 MED ORDER — EPHEDRINE SULFATE 50 MG/5ML SYR (10 MG/ML) (AN)(OSM)
INTRAVENOUS | 0 refills | Status: DC
Start: 2021-12-15 — End: 2021-12-15
  Administered 2021-12-15 (×2): 5 mg via INTRAVENOUS

## 2021-12-15 MED ORDER — FENTANYL CITRATE (PF) 50 MCG/ML IJ SOLN
INTRAVENOUS | 0 refills | Status: DC
Start: 2021-12-15 — End: 2021-12-15
  Administered 2021-12-15: 18:00:00 25 ug via INTRAVENOUS
  Administered 2021-12-15: 14:00:00 100 ug via INTRAVENOUS
  Administered 2021-12-15: 17:00:00 25 ug via INTRAVENOUS
  Administered 2021-12-15: 14:00:00 50 ug via INTRAVENOUS

## 2021-12-15 MED ORDER — ARTIFICIAL TEARS (PF) SINGLE DOSE DROPS GROUP
OPHTHALMIC | 0 refills | Status: DC
Start: 2021-12-15 — End: 2021-12-15
  Administered 2021-12-15: 14:00:00 1 [drp] via OPHTHALMIC

## 2021-12-15 MED ORDER — PROPOFOL INJ 10 MG/ML IV VIAL
INTRAVENOUS | 0 refills | Status: DC
Start: 2021-12-15 — End: 2021-12-15
  Administered 2021-12-15: 14:00:00 100 mg via INTRAVENOUS

## 2021-12-15 MED ORDER — ONDANSETRON HCL (PF) 4 MG/2 ML IJ SOLN
INTRAVENOUS | 0 refills | Status: DC
Start: 2021-12-15 — End: 2021-12-15
  Administered 2021-12-15: 17:00:00 4 mg via INTRAVENOUS

## 2021-12-15 MED ORDER — SUGAMMADEX 100 MG/ML IV SOLN
INTRAVENOUS | 0 refills | Status: DC
Start: 2021-12-15 — End: 2021-12-15
  Administered 2021-12-15: 17:00:00 160 mg via INTRAVENOUS

## 2021-12-15 MED ORDER — ROCURONIUM 10 MG/ML IV SOLN
INTRAVENOUS | 0 refills | Status: DC
Start: 2021-12-15 — End: 2021-12-15
  Administered 2021-12-15: 15:00:00 15 mg via INTRAVENOUS
  Administered 2021-12-15: 15:00:00 10 mg via INTRAVENOUS
  Administered 2021-12-15: 14:00:00 50 mg via INTRAVENOUS

## 2021-12-15 MED ORDER — ELECTROLYTE-A IV SOLP
INTRAVENOUS | 0 refills | Status: DC
Start: 2021-12-15 — End: 2021-12-15
  Administered 2021-12-15: 13:00:00 via INTRAVENOUS

## 2021-12-15 MED ADMIN — SODIUM CHLORIDE 0.9 % IV SOLP [27838]: 750 mL | INTRAVENOUS | @ 13:00:00 | Stop: 2021-12-15 | NDC 00338004904

## 2021-12-15 MED ADMIN — HEPARIN (PORCINE) 1,000 UNIT/ML IJ SOLN [10176]: 1000.000 mL | @ 15:00:00 | Stop: 2021-12-15 | NDC 63323054001

## 2021-12-15 MED ADMIN — CEFAZOLIN 1 GRAM IJ SOLR [1445]: 500.000 mL | @ 17:00:00 | Stop: 2021-12-15 | NDC 00143992490

## 2021-12-15 MED ADMIN — SODIUM CHLORIDE 0.9 % IV SOLP [27838]: 1000.000 mL | @ 15:00:00 | Stop: 2021-12-15 | NDC 00264999900

## 2021-12-15 MED ADMIN — INSULIN ASPART 100 UNIT/ML SC FLEXPEN [87504]: 4 [IU] | SUBCUTANEOUS | @ 23:00:00 | NDC 00169633910

## 2021-12-15 MED ADMIN — SODIUM CHLORIDE 0.9 % IV SOLP [27838]: 500.000 mL | @ 17:00:00 | Stop: 2021-12-15 | NDC 00338004904

## 2021-12-15 MED ADMIN — EZETIMIBE 10 MG PO TAB [86887]: 10 mg | ORAL | @ 22:00:00 | NDC 67877049030

## 2021-12-15 MED ADMIN — CEFAZOLIN INJ 1GM IVP [210319]: 2 g | INTRAVENOUS | @ 14:00:00 | Stop: 2021-12-15 | NDC 00143992490

## 2021-12-15 MED ADMIN — FENTANYL CITRATE (PF) 50 MCG/ML IJ SOLN [3037]: 12.5 ug | INTRAVENOUS | @ 20:00:00 | Stop: 2021-12-15 | NDC 00409909412

## 2021-12-15 MED ADMIN — LIDOCAINE (PF) 10 MG/ML (1 %) IJ SOLN [95838]: 12.5 mL | @ 15:00:00 | Stop: 2021-12-15 | NDC 55150016205

## 2021-12-15 MED ADMIN — IODIXANOL 320 MG IODINE/ML IV SOLN [78198]: 7.5 mL | INTRAVENOUS | @ 15:00:00 | Stop: 2021-12-15 | NDC 00407222321

## 2021-12-15 MED ADMIN — ACETAMINOPHEN 500 MG PO TAB [102]: 1000 mg | ORAL | @ 22:00:00 | Stop: 2021-12-15 | NDC 00904672080

## 2021-12-15 MED ADMIN — BUPIVACAINE (PF) 0.25 % (2.5 MG/ML) IJ SOLN [87866]: 12.5 mL | @ 15:00:00 | Stop: 2021-12-15 | NDC 00409155918

## 2021-12-16 ENCOUNTER — Encounter: Admit: 2021-12-16 | Discharge: 2021-12-16 | Payer: MEDICARE

## 2021-12-16 DIAGNOSIS — I2510 Atherosclerotic heart disease of native coronary artery without angina pectoris: Secondary | ICD-10-CM

## 2021-12-16 DIAGNOSIS — I7789 Other specified disorders of arteries and arterioles: Secondary | ICD-10-CM

## 2021-12-16 DIAGNOSIS — T82110A Breakdown (mechanical) of cardiac electrode, initial encounter: Secondary | ICD-10-CM

## 2021-12-16 DIAGNOSIS — T50905A Adverse effect of unspecified drugs, medicaments and biological substances, initial encounter: Secondary | ICD-10-CM

## 2021-12-16 DIAGNOSIS — I208 Other forms of angina pectoris: Secondary | ICD-10-CM

## 2021-12-16 DIAGNOSIS — U071 COVID-19: Secondary | ICD-10-CM

## 2021-12-16 DIAGNOSIS — K219 Gastro-esophageal reflux disease without esophagitis: Secondary | ICD-10-CM

## 2021-12-16 DIAGNOSIS — I10 Essential (primary) hypertension: Secondary | ICD-10-CM

## 2021-12-16 DIAGNOSIS — J45909 Unspecified asthma, uncomplicated: Secondary | ICD-10-CM

## 2021-12-16 DIAGNOSIS — E785 Hyperlipidemia, unspecified: Secondary | ICD-10-CM

## 2021-12-16 DIAGNOSIS — H9190 Unspecified hearing loss, unspecified ear: Secondary | ICD-10-CM

## 2021-12-16 DIAGNOSIS — R9439 Abnormal result of other cardiovascular function study: Secondary | ICD-10-CM

## 2021-12-16 DIAGNOSIS — I6529 Occlusion and stenosis of unspecified carotid artery: Secondary | ICD-10-CM

## 2021-12-16 DIAGNOSIS — R011 Cardiac murmur, unspecified: Secondary | ICD-10-CM

## 2021-12-16 DIAGNOSIS — I495 Sick sinus syndrome: Secondary | ICD-10-CM

## 2021-12-16 DIAGNOSIS — E119 Type 2 diabetes mellitus without complications: Secondary | ICD-10-CM

## 2021-12-16 DIAGNOSIS — I480 Paroxysmal atrial fibrillation: Secondary | ICD-10-CM

## 2021-12-16 MED ADMIN — ACETAMINOPHEN 325 MG PO TAB [101]: 650 mg | ORAL | @ 06:00:00 | NDC 00904677361

## 2021-12-16 MED ADMIN — METOPROLOL TARTRATE 25 MG PO TAB [37637]: 25 mg | ORAL | @ 14:00:00 | NDC 62332011231

## 2021-12-16 MED ADMIN — ATORVASTATIN 40 MG PO TAB [77113]: 80 mg | ORAL | @ 03:00:00 | NDC 60505258008

## 2021-12-16 MED ADMIN — ACETAMINOPHEN 325 MG PO TAB [101]: 650 mg | ORAL | @ 12:00:00 | NDC 00904677361

## 2021-12-16 MED ADMIN — EZETIMIBE 10 MG PO TAB [86887]: 10 mg | ORAL | @ 14:00:00 | NDC 67877049030

## 2021-12-16 MED ADMIN — CITALOPRAM 40 MG PO TAB [23490]: 40 mg | ORAL | @ 14:00:00 | NDC 69097082412

## 2021-12-16 MED ADMIN — DORZOLAMIDE 2 % OP DROP [82928]: 1 [drp] | OPHTHALMIC | @ 03:00:00 | NDC 24208048510

## 2021-12-16 MED ADMIN — ASPIRIN 81 MG PO TBEC [14113]: 81 mg | ORAL | @ 14:00:00 | NDC 00536123441

## 2021-12-16 MED ADMIN — LATANOPROST 0.005 % OP DROP [77052]: 1 [drp] | OPHTHALMIC | @ 03:00:00 | NDC 61314054701

## 2021-12-16 MED ADMIN — APIXABAN 5 MG PO TAB [315778]: 5 mg | ORAL | @ 14:00:00 | NDC 00003089431

## 2021-12-16 MED ADMIN — VALSARTAN 160 MG PO TAB [83018]: 80 mg | ORAL | @ 14:00:00 | NDC 43547036909

## 2021-12-16 MED ADMIN — PANTOPRAZOLE 20 MG PO TBEC [79463]: 20 mg | ORAL | @ 14:00:00 | NDC 65862055990

## 2021-12-16 MED ADMIN — METOPROLOL TARTRATE 25 MG PO TAB [37637]: 25 mg | ORAL | @ 03:00:00 | NDC 62332011231

## 2021-12-17 ENCOUNTER — Encounter: Admit: 2021-12-17 | Discharge: 2021-12-17 | Payer: MEDICARE

## 2021-12-17 DIAGNOSIS — I480 Paroxysmal atrial fibrillation: Secondary | ICD-10-CM

## 2021-12-17 DIAGNOSIS — Z950 Presence of cardiac pacemaker: Secondary | ICD-10-CM

## 2021-12-18 ENCOUNTER — Encounter: Admit: 2021-12-18 | Discharge: 2021-12-18 | Payer: MEDICARE

## 2021-12-22 ENCOUNTER — Encounter: Admit: 2021-12-22 | Discharge: 2021-12-22 | Payer: MEDICARE

## 2021-12-22 ENCOUNTER — Ambulatory Visit: Admit: 2021-12-22 | Discharge: 2021-12-22 | Payer: MEDICARE

## 2021-12-22 NOTE — Progress Notes
Left prepectoral incision is clean, dry, well approximated, and healing without evidence of drainage or discharge. Incision was closed using Dermabond and is dry and intact. Pt reports no adverse symptoms. Incision care, including signs and symptoms of infection, and Dermabond healing process reviewed (as below). Pt verbalized understanding and will remain in phone contact.    HAS PATIENT BEEN PROVIDED WITH REMOTE MONITORING AGREEMENT? [x]    AVOID TOPICAL MEDICATIONS  ? Do not apply liquid or ointment medications or any other product to your wound while the  DERMABOND adhesive film is in place. These may loosen the film before your wound is healed.  KEEP WOUND DRY AND PROTECTED  ? You may occasionally and briefly wet your wound in the shower or bath. Do not soak or scrub  your wound, do not swim, and avoid periods of heavy perspiration until the DERMABOND  adhesive has naturally fallen off. After showering or bathing, gently blot your wound dry with a  soft towel. If a protective dressing is being used, apply a fresh, dry bandage, being sure to keep  the tape off the DERMABOND adhesive film.  ? Apply a clean, dry bandage over the wound if necessary to protect it.  ? Protect your wound from injury until the skin has had sufficient time to heal.  ? Do not scratch, rub, or pick at the DERMABOND adhesive film. This may loosen the film before  your wound is healed.  ? Protect the wound from prolonged exposure to sunlight or tanning lamps while the film is in  place.    Your incision should gradually look better each day. Please notify our office immediately if you notice any of the following:   -an increase in swelling or redness   -any drainage   -increasing pain at the incision site  -fever over 100 degrees or chills    Instructions for Post-Device Placement  ? May drive 2 days after procedure - Completed  ? Return to work/school in 3-5 days - Completed  ? No lifting >15 pounds and no sex for one week post procedure - Completed  ? You will need to continue to not life your arm above your should for 3 more weeks of the side the implant is on.  ? No strenuous activities for 6 weeks total.  ? No baseball/golf/tennis for 90 days.

## 2021-12-28 ENCOUNTER — Encounter: Admit: 2021-12-28 | Discharge: 2021-12-28 | Payer: MEDICARE

## 2021-12-28 ENCOUNTER — Ambulatory Visit: Admit: 2021-12-28 | Discharge: 2021-12-29 | Payer: MEDICARE

## 2021-12-28 DIAGNOSIS — T50905A Adverse effect of unspecified drugs, medicaments and biological substances, initial encounter: Secondary | ICD-10-CM

## 2021-12-28 DIAGNOSIS — I208 Other forms of angina pectoris: Secondary | ICD-10-CM

## 2021-12-28 DIAGNOSIS — I10 Essential (primary) hypertension: Secondary | ICD-10-CM

## 2021-12-28 DIAGNOSIS — I495 Sick sinus syndrome: Secondary | ICD-10-CM

## 2021-12-28 DIAGNOSIS — K219 Gastro-esophageal reflux disease without esophagitis: Secondary | ICD-10-CM

## 2021-12-28 DIAGNOSIS — R011 Cardiac murmur, unspecified: Secondary | ICD-10-CM

## 2021-12-28 DIAGNOSIS — E119 Type 2 diabetes mellitus without complications: Secondary | ICD-10-CM

## 2021-12-28 DIAGNOSIS — I7789 Other specified disorders of arteries and arterioles: Secondary | ICD-10-CM

## 2021-12-28 DIAGNOSIS — I480 Paroxysmal atrial fibrillation: Secondary | ICD-10-CM

## 2021-12-28 DIAGNOSIS — U071 COVID-19: Secondary | ICD-10-CM

## 2021-12-28 DIAGNOSIS — E7800 Pure hypercholesterolemia, unspecified: Secondary | ICD-10-CM

## 2021-12-28 DIAGNOSIS — H9190 Unspecified hearing loss, unspecified ear: Secondary | ICD-10-CM

## 2021-12-28 DIAGNOSIS — T82110A Breakdown (mechanical) of cardiac electrode, initial encounter: Secondary | ICD-10-CM

## 2021-12-28 DIAGNOSIS — I6529 Occlusion and stenosis of unspecified carotid artery: Secondary | ICD-10-CM

## 2021-12-28 DIAGNOSIS — R9439 Abnormal result of other cardiovascular function study: Secondary | ICD-10-CM

## 2021-12-28 DIAGNOSIS — I2510 Atherosclerotic heart disease of native coronary artery without angina pectoris: Secondary | ICD-10-CM

## 2021-12-28 DIAGNOSIS — J45909 Unspecified asthma, uncomplicated: Secondary | ICD-10-CM

## 2021-12-28 DIAGNOSIS — E785 Hyperlipidemia, unspecified: Secondary | ICD-10-CM

## 2021-12-28 NOTE — Progress Notes
Date of Service: 12/28/2021              Chief Complaint   Patient presents with   ? Post Operative Visit     L CEA 12/02/21       History of Present Illness  Marc Nelson is a very pleasant 76 year old male who presents today for follow-up of left carotid endarterectomy, on 12/02/2021.  He is asymptomatic and reports no symptoms of strokelike symptoms at this time.  He has recovered well.  He has continued to take his aspirin and statin daily.      Medical History:   Diagnosis Date   ? Abnormal stress test    ? Acid reflux     tums prn   ? Adverse drug reaction    ? 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 Asthma    ? CAD (coronary artery disease) 08/23/2011   ? Carotid stenosis    ? COVID-19 08/16/2021    no hosp, no SE   ? DM II (diabetes mellitus, type II), controlled (HCC) 10/22/2009    A1c: 06-2021 7.8    FBS:  ~120   ? Dyslipidemia 10/22/2009   ? Heart murmur    ? High cholesterol    ? HLD (hyperlipidemia)    ? 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 HTN (hypertension)    ? Other specified disorders of arteries and arterioles (HCC)    ? Pacemaker lead fracture    ? Paroxysmal A-fib (HCC) 09/03/2011   ? SSS (sick sinus syndrome) [facility])        Surgical History:   Procedure Laterality Date   ? HX CORONARY ARTERY BYPASS GRAFT  2013    @ [place], x 5 grafts   ? PACEMAKER INSERTION  10/2015    PPM at [place]--[facility]   ? 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/24/2016    Performed by Zollie Pee, MD at [facility] CATH LAB   ? Fluoroscopy N/A 03/24/2016    Performed by Zollie Pee, MD at [facility] CATH LAB   ? Removal Implantable Loop Recorder  03/24/2016    Performed by Zollie Pee, MD at [facility] CATH LAB   ? KNEE REPLACEMENT Bilateral 06/2020    10-2020   ? ENDARTERECTOMY CAROTID Left 12/02/2021    Performed by Dewitt Rota, DO at [facility] OR   ? PACEMAKER PLACEMENT  12/15/2021    replacement   ? REMOVAL PERMANENT PACEMAKER GENERATOR N/A 12/15/2021    Performed by Janalyn Rouse, MD at [facility] CVOR   ? REMOVAL PERMANENT PACEMAKER LEAD - DUAL LEAD SYSTEM N/A 12/15/2021    Performed by Janalyn Rouse, MD at [facility] CVOR   ? INSERTION/ REPLACEMENT PERMANENT PACEMAKER WITH ATRIAL AND VENTRICULAR LEAD N/A 12/15/2021    Performed by Janalyn Rouse, MD at [facility] CVOR   ? INSERTION/ REPLACEMENT TEMPORARY PACEMAKER LEAD/ CATHETER N/A 12/15/2021    Performed by Janalyn Rouse, MD at [facility] CVOR   ? TRANSESOPHAGEAL ECHOCARDIOGRAM DURING INTERVENTION N/A 12/15/2021    Performed by Janalyn Rouse, MD at [facility] CVOR   ? CARDIOTHORACIC SURGERY STANDBY N/A 12/15/2021    Performed by Janalyn Rouse, MD at [facility] CVOR   ? CARDIAC CATHERIZATION     ? CARDIOVASCULAR STRESS TEST     ? CARPAL TUNNEL RELEASE Bilateral    ? CATARACT REMOVAL WITH IMPLANT     ? COLONOSCOPY     ?  DOPPLER ECHOCARDIOGRAPHY     ? ELECTROCARDIOGRAM     ? FINGER SURGERY      right hand 5th finger laceration repair   ? HX HEART CATHETERIZATION     ? HX HERNIA REPAIR Left    ? HX KNEE SURGERY      right   ? HX ROTATOR CUFF REPAIR Bilateral    ? LEFT HEART CATHETERIZATION     ? MYOCARDIAL PERFUSION IMG STUDY     ? US DUPLEX SCAN CAROTID BILATERAL         Allergies:  Allergies   Allergen Reactions   ? Metformin DIARRHEA and STOMACH UPSET   ? Morphine NAUSEA AND VOMITING   ? Ragweed EYE IRRITATION and SNEEZING       Medication List:  ? acetaminophen (TYLENOL) 325 mg tablet Take two tablets by mouth every 4 hours as needed.   ? apixaban (ELIQUIS) 5 mg tablet Take one tablet by mouth twice daily.   ? aspirin EC 81 mg tablet Take one tablet by mouth daily. Take with food.   ? atorvastatin (LIPITOR) 80 mg tablet Take one tablet by mouth at bedtime daily.   ? candesartan (ATACAND) 32 mg tablet TAKE ONE-HALF TABLET BY MOUTH DAILY   ? cetirizine (ZYRTEC) 10 mg tablet Take one tablet by mouth daily as needed.   ? cholecalciferol (Vitamin D3) (VITAMIN D-3) 1,000 units tablet Take one tablet by mouth daily.     ? citalopram (CELEXA) 40 mg tablet Take one tablet by mouth daily.   ? cyanocobalamin (vitamin B-12) 1,000 mcg tablet Take one tablet by mouth daily.   ? dorzolamide (TRUSOPT) 2 % ophthalmic solution Apply one drop to both eyes twice daily.   ? empagliflozin (JARDIANCE) 10 mg tablet Take one tablet by mouth daily.   ? ezetimibe (ZETIA) 10 mg tablet Take one tablet by mouth daily.   ? insulin glargine-yfgn (SEMGLEE PEN) 100 unit/mL (3 mL) injectable PEN Inject thirty two Units under the skin daily.   ? metoprolol tartrate (LOPRESSOR) 50 mg tablet Take one-half tablet by mouth twice daily.   ? nitroglycerin (NITROSTAT) 0.4 mg tablet DISSOLVE 1 TAB UNDER TONGUE FOR CHEST PAIN - IF PAIN REMAINS AFTER 5 MIN, CALL 911 AND REPEAT DOSE. MAX 3 TABS IN 15 MINUTES   ? NOVOLOG FLEXPEN 100 unit/mL injection PEN three Units. Inject 8 units with breakfast, 8 units with lunch, and 15 units with supper  Indications: 3 units at??lunch, 18 units with dinner   ? oxyCODONE (ROXICODONE) 5 mg tablet Take one tablet to two tablets by mouth every 4 hours as needed.   ? pantoprazole DR (PROTONIX) 20 mg tablet Take one tablet by mouth daily.   ? travoprost (TRAVATAN Z) 0.004 % ophthalmic solution Apply one drop to both eyes twice daily.   ? ubidecarenone (COENZYME Q10 PO) Take 1 capsule by mouth daily.       Social History:   reports that he has never smoked. He has never used smokeless tobacco. He reports that he does not drink alcohol and does not use drugs.    Family History   Problem Relation Age of Onset   ? Cancer Mother         colon cancer   ? Hypertension Mother    ? Heart murmur Mother    ? Cancer Father         prostate   ? Hypertension Father    ? Stroke Father    ? Cancer-Prostate Father    ?  Heart murmur Brother    ? Cancer-Prostate Brother    ? Hypertension Brother        Review of Systems  Negative except those listed in the HPI         Vitals:    12/28/21 0956 12/28/21 1003   BP: 127/79 (!) 146/78   BP Source: Arm, Left Upper Arm, Right Upper   Pulse: 65 62   Temp: 36.4 ?C (97.6 ?F)    TempSrc: Oral    PainSc: Zero Weight: 82.6 kg (182 lb)    Height: 177.8 cm (5' 10)      Body mass index is 26.11 kg/m?Marland Kitchen     Physical Exam  Left neck incision well-healing without hematoma or ecchymosis  Cranial nerves II through XII grossly intact, strength and sensation intact bilateral upper and lower extremities      Assessment and Plan:    1. Left carotid stenosis  VAS [place] DOP EXTRACRANIAL LTD UNILAT    VAS US DUPLEX SCAN CAROTID BILATERAL      2. H/O carotid endarterectomy  VAS [place] DOP EXTRACRANIAL LTD UNILAT    VAS US DUPLEX SCAN CAROTID BILATERAL         76 year old male s/p L CEA for asymptomatic carotid stenosis, recovering well.  We will continue aspirin and statin therapy for him.  He will follow-up in 2 weeks with a new duplex of his left carotid artery for surveillance and a new baseline.  We will have him come back in 6 months for a repeat carotid artery duplex and office visit for continued surveillance.  He and his wife were understanding of and agreeable to proceeding with this plan.     Verlon Au, DO

## 2021-12-29 DIAGNOSIS — I6522 Occlusion and stenosis of left carotid artery: Secondary | ICD-10-CM

## 2021-12-29 DIAGNOSIS — Z9889 Other specified postprocedural states: Secondary | ICD-10-CM

## 2022-01-12 ENCOUNTER — Encounter: Admit: 2022-01-12 | Discharge: 2022-01-12 | Payer: MEDICARE

## 2022-01-12 ENCOUNTER — Ambulatory Visit: Admit: 2022-01-12 | Discharge: 2022-01-12 | Payer: MEDICARE

## 2022-01-12 DIAGNOSIS — I6522 Occlusion and stenosis of left carotid artery: Secondary | ICD-10-CM

## 2022-01-12 DIAGNOSIS — Z9889 Other specified postprocedural states: Secondary | ICD-10-CM

## 2022-02-05 ENCOUNTER — Encounter: Admit: 2022-02-05 | Discharge: 2022-02-05 | Payer: MEDICARE

## 2022-02-25 ENCOUNTER — Encounter: Admit: 2022-02-25 | Discharge: 2022-02-25 | Payer: MEDICARE

## 2022-03-03 ENCOUNTER — Encounter: Admit: 2022-03-03 | Discharge: 2022-03-03 | Payer: MEDICARE

## 2022-03-03 ENCOUNTER — Ambulatory Visit: Admit: 2022-03-03 | Discharge: 2022-03-04 | Payer: MEDICARE

## 2022-03-03 DIAGNOSIS — I208 Other forms of angina pectoris: Secondary | ICD-10-CM

## 2022-03-03 DIAGNOSIS — J45909 Unspecified asthma, uncomplicated: Secondary | ICD-10-CM

## 2022-03-03 DIAGNOSIS — U071 COVID-19: Secondary | ICD-10-CM

## 2022-03-03 DIAGNOSIS — Z9889 Other specified postprocedural states: Secondary | ICD-10-CM

## 2022-03-03 DIAGNOSIS — E785 Hyperlipidemia, unspecified: Secondary | ICD-10-CM

## 2022-03-03 DIAGNOSIS — R9439 Abnormal result of other cardiovascular function study: Secondary | ICD-10-CM

## 2022-03-03 DIAGNOSIS — I480 Paroxysmal atrial fibrillation: Secondary | ICD-10-CM

## 2022-03-03 DIAGNOSIS — K219 Gastro-esophageal reflux disease without esophagitis: Secondary | ICD-10-CM

## 2022-03-03 DIAGNOSIS — I495 Sick sinus syndrome: Secondary | ICD-10-CM

## 2022-03-03 DIAGNOSIS — T50905A Adverse effect of unspecified drugs, medicaments and biological substances, initial encounter: Secondary | ICD-10-CM

## 2022-03-03 DIAGNOSIS — I6522 Occlusion and stenosis of left carotid artery: Secondary | ICD-10-CM

## 2022-03-03 DIAGNOSIS — I2510 Atherosclerotic heart disease of native coronary artery without angina pectoris: Secondary | ICD-10-CM

## 2022-03-03 DIAGNOSIS — I7789 Other specified disorders of arteries and arterioles: Secondary | ICD-10-CM

## 2022-03-03 DIAGNOSIS — I10 Essential (primary) hypertension: Secondary | ICD-10-CM

## 2022-03-03 DIAGNOSIS — Z950 Presence of cardiac pacemaker: Secondary | ICD-10-CM

## 2022-03-03 DIAGNOSIS — E7800 Pure hypercholesterolemia, unspecified: Secondary | ICD-10-CM

## 2022-03-03 DIAGNOSIS — I6529 Occlusion and stenosis of unspecified carotid artery: Secondary | ICD-10-CM

## 2022-03-03 DIAGNOSIS — R011 Cardiac murmur, unspecified: Secondary | ICD-10-CM

## 2022-03-03 DIAGNOSIS — H9190 Unspecified hearing loss, unspecified ear: Secondary | ICD-10-CM

## 2022-03-03 DIAGNOSIS — E119 Type 2 diabetes mellitus without complications: Secondary | ICD-10-CM

## 2022-03-03 DIAGNOSIS — T82110A Breakdown (mechanical) of cardiac electrode, initial encounter: Secondary | ICD-10-CM

## 2022-03-03 NOTE — Patient Instructions
Thank you for visiting our office today.   We recommend follow-up with our office in 6 months.     Take your medications as ordered.   Check your list with what you have on hand at home.   Should you have any additional questions or concerns, please message me through MyChart or call the office.    Cardiovascular Medicine Team   Clinic phone: [phone]

## 2022-03-19 ENCOUNTER — Encounter: Admit: 2022-03-19 | Discharge: 2022-03-19 | Payer: MEDICARE

## 2022-03-28 ENCOUNTER — Encounter: Admit: 2022-03-28 | Discharge: 2022-03-28 | Payer: MEDICARE

## 2022-04-02 ENCOUNTER — Encounter: Admit: 2022-04-02 | Discharge: 2022-04-02 | Payer: MEDICARE

## 2022-04-02 ENCOUNTER — Ambulatory Visit: Admit: 2022-04-02 | Discharge: 2022-04-02 | Payer: MEDICARE

## 2022-04-02 DIAGNOSIS — T82110A Breakdown (mechanical) of cardiac electrode, initial encounter: Secondary | ICD-10-CM

## 2022-04-02 DIAGNOSIS — E7800 Pure hypercholesterolemia, unspecified: Secondary | ICD-10-CM

## 2022-04-02 DIAGNOSIS — R9439 Abnormal result of other cardiovascular function study: Secondary | ICD-10-CM

## 2022-04-02 DIAGNOSIS — I10 Essential (primary) hypertension: Secondary | ICD-10-CM

## 2022-04-02 DIAGNOSIS — I6529 Occlusion and stenosis of unspecified carotid artery: Secondary | ICD-10-CM

## 2022-04-02 DIAGNOSIS — I495 Sick sinus syndrome: Secondary | ICD-10-CM

## 2022-04-02 DIAGNOSIS — U071 COVID-19: Secondary | ICD-10-CM

## 2022-04-02 DIAGNOSIS — I480 Paroxysmal atrial fibrillation: Secondary | ICD-10-CM

## 2022-04-02 DIAGNOSIS — E119 Type 2 diabetes mellitus without complications: Secondary | ICD-10-CM

## 2022-04-02 DIAGNOSIS — I2510 Atherosclerotic heart disease of native coronary artery without angina pectoris: Secondary | ICD-10-CM

## 2022-04-02 DIAGNOSIS — T50905A Adverse effect of unspecified drugs, medicaments and biological substances, initial encounter: Secondary | ICD-10-CM

## 2022-04-02 DIAGNOSIS — J45909 Unspecified asthma, uncomplicated: Secondary | ICD-10-CM

## 2022-04-02 DIAGNOSIS — Z950 Presence of cardiac pacemaker: Secondary | ICD-10-CM

## 2022-04-02 DIAGNOSIS — R011 Cardiac murmur, unspecified: Secondary | ICD-10-CM

## 2022-04-02 DIAGNOSIS — I7789 Other specified disorders of arteries and arterioles: Secondary | ICD-10-CM

## 2022-04-02 DIAGNOSIS — I208 Other forms of angina pectoris: Secondary | ICD-10-CM

## 2022-04-02 DIAGNOSIS — K219 Gastro-esophageal reflux disease without esophagitis: Secondary | ICD-10-CM

## 2022-04-02 DIAGNOSIS — E785 Hyperlipidemia, unspecified: Secondary | ICD-10-CM

## 2022-04-02 DIAGNOSIS — H9190 Unspecified hearing loss, unspecified ear: Secondary | ICD-10-CM

## 2022-04-03 ENCOUNTER — Encounter: Admit: 2022-04-03 | Discharge: 2022-04-03 | Payer: MEDICARE

## 2022-04-03 DIAGNOSIS — K219 Gastro-esophageal reflux disease without esophagitis: Secondary | ICD-10-CM

## 2022-04-03 DIAGNOSIS — I208 Other forms of angina pectoris: Secondary | ICD-10-CM

## 2022-04-03 DIAGNOSIS — T50905A Adverse effect of unspecified drugs, medicaments and biological substances, initial encounter: Secondary | ICD-10-CM

## 2022-04-03 DIAGNOSIS — J45909 Unspecified asthma, uncomplicated: Secondary | ICD-10-CM

## 2022-04-03 DIAGNOSIS — E7800 Pure hypercholesterolemia, unspecified: Secondary | ICD-10-CM

## 2022-04-03 DIAGNOSIS — I2510 Atherosclerotic heart disease of native coronary artery without angina pectoris: Secondary | ICD-10-CM

## 2022-04-03 DIAGNOSIS — R9439 Abnormal result of other cardiovascular function study: Secondary | ICD-10-CM

## 2022-04-03 DIAGNOSIS — I480 Paroxysmal atrial fibrillation: Secondary | ICD-10-CM

## 2022-04-03 DIAGNOSIS — I10 Essential (primary) hypertension: Secondary | ICD-10-CM

## 2022-04-03 DIAGNOSIS — T82110A Breakdown (mechanical) of cardiac electrode, initial encounter: Secondary | ICD-10-CM

## 2022-04-03 DIAGNOSIS — E785 Hyperlipidemia, unspecified: Secondary | ICD-10-CM

## 2022-04-03 DIAGNOSIS — U071 COVID-19: Secondary | ICD-10-CM

## 2022-04-03 DIAGNOSIS — R011 Cardiac murmur, unspecified: Secondary | ICD-10-CM

## 2022-04-03 DIAGNOSIS — H9190 Unspecified hearing loss, unspecified ear: Secondary | ICD-10-CM

## 2022-04-03 DIAGNOSIS — E119 Type 2 diabetes mellitus without complications: Secondary | ICD-10-CM

## 2022-04-03 DIAGNOSIS — I495 Sick sinus syndrome: Secondary | ICD-10-CM

## 2022-04-03 DIAGNOSIS — I7789 Other specified disorders of arteries and arterioles: Secondary | ICD-10-CM

## 2022-04-03 DIAGNOSIS — I6529 Occlusion and stenosis of unspecified carotid artery: Secondary | ICD-10-CM

## 2022-04-12 ENCOUNTER — Encounter: Admit: 2022-04-12 | Discharge: 2022-04-12 | Payer: MEDICARE

## 2022-05-13 ENCOUNTER — Encounter: Admit: 2022-05-13 | Discharge: 2022-05-13 | Payer: MEDICARE

## 2022-05-18 ENCOUNTER — Encounter: Admit: 2022-05-18 | Discharge: 2022-05-18 | Payer: MEDICARE

## 2022-05-18 ENCOUNTER — Ambulatory Visit: Admit: 2022-05-18 | Discharge: 2022-05-18 | Payer: MEDICARE

## 2022-05-18 DIAGNOSIS — E785 Hyperlipidemia, unspecified: Secondary | ICD-10-CM

## 2022-05-18 DIAGNOSIS — T50905A Adverse effect of unspecified drugs, medicaments and biological substances, initial encounter: Secondary | ICD-10-CM

## 2022-05-18 DIAGNOSIS — E119 Type 2 diabetes mellitus without complications: Secondary | ICD-10-CM

## 2022-05-18 DIAGNOSIS — I2510 Atherosclerotic heart disease of native coronary artery without angina pectoris: Secondary | ICD-10-CM

## 2022-05-18 DIAGNOSIS — E7800 Pure hypercholesterolemia, unspecified: Secondary | ICD-10-CM

## 2022-05-18 DIAGNOSIS — I480 Paroxysmal atrial fibrillation: Secondary | ICD-10-CM

## 2022-05-18 DIAGNOSIS — R9439 Abnormal result of other cardiovascular function study: Secondary | ICD-10-CM

## 2022-05-18 DIAGNOSIS — I10 Essential (primary) hypertension: Secondary | ICD-10-CM

## 2022-05-18 DIAGNOSIS — H9190 Unspecified hearing loss, unspecified ear: Secondary | ICD-10-CM

## 2022-05-18 DIAGNOSIS — I6529 Occlusion and stenosis of unspecified carotid artery: Secondary | ICD-10-CM

## 2022-05-18 DIAGNOSIS — T82110A Breakdown (mechanical) of cardiac electrode, initial encounter: Secondary | ICD-10-CM

## 2022-05-18 DIAGNOSIS — J45909 Unspecified asthma, uncomplicated: Secondary | ICD-10-CM

## 2022-05-18 DIAGNOSIS — K219 Gastro-esophageal reflux disease without esophagitis: Secondary | ICD-10-CM

## 2022-05-18 DIAGNOSIS — I7789 Other specified disorders of arteries and arterioles: Secondary | ICD-10-CM

## 2022-05-18 DIAGNOSIS — I2089 Angina of effort: Secondary | ICD-10-CM

## 2022-05-18 DIAGNOSIS — U071 COVID-19: Secondary | ICD-10-CM

## 2022-05-18 DIAGNOSIS — R011 Cardiac murmur, unspecified: Secondary | ICD-10-CM

## 2022-05-18 DIAGNOSIS — I495 Sick sinus syndrome: Secondary | ICD-10-CM

## 2022-05-18 NOTE — Patient Instructions
Follow-Up:    -Thank you for allowing [place] to participate in your care today. Your After Visit Summary is being completed by Geronimo Boot, RN.    -We would like you to follow up  as needed  with Harley Alto, MD  -The schedule is released approximately 4-5 months in advance. You will be called by our scheduling department to make an appointment and you will also receive a notification via MyChart to self-schedule.  However, if you would like to call to make this appointment, please call [phone].    Changes From Today's Office Visit   Follow up with Dr. Chales Abrahams as needed     Contacting our office:    -For NON-URGENT questions please contact us via message through your MyChart account.   -For all medication refills please contact your pharmacy or send a request through MyChart.     -For all questions that may need to be addressed urgently please call the [facility] nursing triage line at [phone] Monday - Friday 8am-5pm only. Please leave a detailed message with your name, date of birth, and reason for your call.  If your message is received before 3:30pm, every effort will be made to call you back the same day.  Please allow time for [place] to review your chart prior to call back.     -Should you have an urgent concern over the weekend/nights, the on-call triage line is [phone].    [facility] nursing team fax number: [phone]    -You may receive a survey in the upcoming weeks from [facility]. Your feedback is important to [place] and helps [place] continue to improve patient care and patient satisfaction.     -Please feel free to call our Financial Department at [phone] with any questions or concerns about estimated cost of testing or imaging ordered today. We are happy to provide CPT codes upon request.    Results & Testing Follow Up:    -Please allow 5-7 business days for the results of any testing to be reviewed. Please call our office if you have not heard from a nurse within this time frame.    -Should you choose to complete testing at an outside facility, please contact our office after completion of testing so that we can ensure that we have received results for your provider to review.    Lab and test results:  As a part of the CARES act, starting 11/15/2019, some results will be released to you via MyChart immediately and automatically.  You may see results before your provider sees them; however, your provider will review all these results and then they, or one of their team, will notify you of result information and recommendations.   Critical results will be addressed immediately, but otherwise, please allow [place] time to get back with you prior to you reaching out to [place] for questions.  This will usually take about 72 hours for labs and 5-7 days for procedure test results.

## 2022-05-18 NOTE — Progress Notes
Date of Service: 05/18/2022    Marc Nelson is a 76 y.o. male.       HPI     Marc Nelson comes today for his first visit with me after his recent left carotid endarterectomy for asymptomatic severe stenosis.  He had Doppler progressive disease and we had referred her to Dr. Emogene Morgan.  He underwent successful left CEA in May and a follow-up ultrasound showed excellent results.  His scar has healed nicely without much problem.    His blood pressure is well controlled and he is on excellent risk modification regimen under the care of Dr. Maude Leriche and Dr. Bufford Buttner    Social history:: He does not use tobacco in fact has not smoked since 1969.  He does not use alcohol in significant quantities.  He is married and was accompanied by his wife.  ?  Past medical history:  1. 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 Paroxysmal atrial fibrillation  3. 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 Hypertension  5. Dyslipidemia  6. Diabetes mellitus on insulin  ?       Vitals:    05/18/22 1528   BP: 127/61   BP Source: Arm, Left Upper   Pulse: 74   SpO2: 98%   O2 Device: None (Room air)   PainSc: Zero   Weight: 84.7 kg (186 lb 12.8 oz)   Height: 177.8 cm (5' 10)     Body mass index is 26.8 kg/m?Marland Kitchen     Past Medical History  Patient Active Problem List    Diagnosis Date Noted   ? Pacemaker lead malfunction, initial encounter 12/15/2021   ? Asymptomatic stenosis of left carotid artery 11/27/2021   ? Pacemaker lead fracture 11/10/2021   ? Left-sided carotid artery disease (HCC) 11/10/2021   ? Hyperlipemia 06/30/2017   ? Sinus node dysfunction (HCC) 05/25/2016     Pacemaker in place.      ? 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 Bernette Mayers     ? Essential hypertension 05/25/2016   ? Bradycardia 03/24/2016 03/24/2016  dual chamber MDT ppm implanted, ILR removed      ? 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 Dizziness 12/27/2013   ? Paroxysmal A-fib (HCC) 09/03/2011     06/20/2012 Holter monitor: revealed normal sinus rhythm, short runs of supraventricular arrhythmias, maximum of 10 beats were noted. There were no episodes of any sustained ventricular or supraventricular arrhythmias noted.   06/21/2012 Echo: EF 60 %, normal LV systolic function. Mild mitral and tricuspid regurgitations. PA pressures were 33 mmHg. LA size 5.4 cm.   07/23/2013 Stress thallium: The patient exercised for 11 minute and 54 seconds. During peak exercise, there was 1 mm horizontal ST depression in II, III, aVF, and V5 and V6. However, SPECT images did not show any significant ischemia. Ejection fraction was noted to be 61 percent.   12/27/13 Holter: The predominating rhythm was NS. Rare Supraventricular Ectopic Beats were observed including Atrial Couplets and a Triplet. Rare Ventricular Ectopic Beats were recognized including Rare Couplets. Patient complained of SOB and weakness. No correlation of his symptoms were noted to any underlying arrhythmia.   06/21/2014 Exercise stress thallium: noted to be normal.   05/06/15 Echo: The LV EF is about 60%. The LA is  mildly enlarged. Mild MR & mild TR noted. The PA systolic pressure is normal. LA size 4.5 cm.   10/20/2015 Regadenoson stress [facility]): no evidence of any significant ischemia and normal ejection fraction at 69%.  10/24/2015 Echo: normal left ventricular function.? Ejection fraction 65%.? 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 LA size 4.3 cm. ? ?  11/24/2015 30-day looping event monitor: multiple episodes of atrial fibrillation and flutter with ventricular rates ranging between 80-110 beats per minute.? One episode of atrial fibrillation was associated with complaints of heart racing  01/06/16 s/p Medtronic LINQ cardiac memory loop recorder at the anterior chest wall (Dr. Bernette Mayers)      ? Thrombocytopenia (HCC) 08/26/2011   ? AKI (acute kidney injury) (HCC) 08/26/2011   ? CAD (coronary artery disease) 08/23/2011   ? Chest pain 07/30/2011   ? SOB (shortness of breath) 07/15/2011   ? Dyslipidemia 10/22/2009   ? DM II (diabetes mellitus, type II), controlled (HCC) 10/22/2009   ? HTN, goal below 140/90 01/02/2009         Review of Systems   Constitutional: Negative.   HENT: Negative.    Eyes: Negative.    Cardiovascular: Negative.    Respiratory: Negative.    Endocrine: Negative.    Hematologic/Lymphatic: Negative.    Skin: Negative.    Musculoskeletal: Negative.    Gastrointestinal: Negative.    Genitourinary: Negative.    Neurological: Negative.    Psychiatric/Behavioral: Negative.    Allergic/Immunologic: Negative.        Physical Exam    Physical Exam  General Appearance: no acute distress   Skin: warm, moist, no ulcers   HENT: unremarkable   Eyes; Pupils are round and reactive. No icterus  Neck Veins: neck veins are flat, neck veins are not distended   Carotid Arteries: normal carotid upstroke bilaterally,  no carotid bruit, left CEA scar  Chest Inspection: chest is normal in appearance   Auscultation/Percussion: lungs clear to auscultation, no rales, rhonchi, or wheezing   Cardiac Rhythm: regular rhythm and normal rate   Cardiac Auscultation: Normal S1 & S2, no S3 or S4, no rub   Murmurs: no cardiac murmurs   Extremities: No pedal edema.  Abdominal Exam: soft, non-tender, no masses, bowel sounds normal   Liver & Spleen: no organomegaly   Neurologic Exam: neurological assessment grossly intact      Cardiovascular Health Factors  Vitals BP Readings from Last 3 Encounters:   05/18/22 127/61   04/02/22 120/60   03/03/22 100/58     Wt Readings from Last 3 Encounters:   05/18/22 84.7 kg (186 lb 12.8 oz) 04/02/22 80.7 kg (178 lb)   03/03/22 83.4 kg (183 lb 14.4 oz)     BMI Readings from Last 3 Encounters:   05/18/22 26.80 kg/m?   04/02/22 25.54 kg/m?   03/03/22 25.65 kg/m?      Smoking Social History     Tobacco Use   Smoking Status Never   Smokeless Tobacco Never      Lipid Profile Cholesterol   Date Value Ref Range Status   11/28/2021 77 <200 MG/DL Final     HDL   Date Value Ref Range Status   11/28/2021 36 (L) >40 MG/DL Final     LDL   Date Value Ref Range Status   11/28/2021 30 <100 mg/dL Final  Triglycerides   Date Value Ref Range Status   11/28/2021 48 <150 MG/DL Final      Blood Sugar Hemoglobin A1C   Date Value Ref Range Status   11/27/2021 7.9 (H) 4.0 - 5.7 % Final     Comment:     The ADA recommends that most patients with type 1 and type 2 diabetes maintain   an A1c level <7%.       Glucose   Date Value Ref Range Status   12/16/2021 160 (H) 70 - 100 MG/DL Final   16/05/9603 540 (H) 70 - 100 MG/DL Final   98/06/9146 829 (H) 70 - 100 MG/DL Final     Glucose, POC   Date Value Ref Range Status   12/16/2021 151 (H) 70 - 100 MG/DL Final   56/21/3086 578 (H) 70 - 100 MG/DL Final   46/96/2952 841 (H) 70 - 100 MG/DL Final          Problems Addressed Today  No diagnosis found.    Assessment and Plan     Carotid disease status post left CEA: He has done well and a follow-up duplex showed no residual stenosis in the right side has no significant stenosis either.  He is optimized on medical therapy.  I do not see a reason to repeat a carotid ultrasound per guidelines for the next year or 2.  I also feel that he does not need to see me or vascular surgery anymore and follow-up as he will be seeing Dr. Bufford Buttner and will decrease the number of visits to physicians.    Cardiac issues: He will continue to follow with Dr. Bufford Buttner    This note was dictated using the dragon speech recognition software.  Transcription errors may occur with the use of this software. Editing and proofreading were done by the author of this document.  In spite of the author's best effort to identify every error introduced by voice to text dictation, some errors that may represent misspelling or misstatements of what was dictated may persist.  If there are questions about content in this document please contact Dr. Harley Alto.           Current Medications (including today's revisions)  ? acetaminophen (TYLENOL) 325 mg tablet Take two tablets by mouth every 4 hours as needed.   ? apixaban (ELIQUIS) 5 mg tablet Take one tablet by mouth twice daily.   ? aspirin EC 81 mg tablet Take one tablet by mouth daily. Take with food.   ? atorvastatin (LIPITOR) 80 mg tablet Take one tablet by mouth at bedtime daily.   ? candesartan (ATACAND) 32 mg tablet TAKE ONE-HALF TABLET BY MOUTH DAILY   ? cetirizine (ZYRTEC) 10 mg tablet Take one tablet by mouth daily as needed.   ? cholecalciferol (Vitamin D3) (VITAMIN D-3) 1,000 units tablet Take one tablet by mouth daily.     ? citalopram (CELEXA) 40 mg tablet Take one-half tablet by mouth daily.   ? cyanocobalamin (vitamin B-12) 1,000 mcg tablet Take one tablet by mouth daily.   ? dorzolamide (TRUSOPT) 2 % ophthalmic solution Apply one drop to both eyes twice daily.   ? ezetimibe (ZETIA) 10 mg tablet Take one tablet by mouth daily.   ? insulin glargine-yfgn (SEMGLEE PEN) 100 unit/mL (3 mL) injectable PEN Inject thirty two Units under the skin daily.   ? JARDIANCE 25 mg tablet    ? metoprolol tartrate (LOPRESSOR) 50 mg tablet Take one-half tablet by mouth twice daily.   ?  nitroglycerin (NITROSTAT) 0.4 mg tablet DISSOLVE 1 TAB UNDER TONGUE FOR CHEST PAIN - IF PAIN REMAINS AFTER 5 MIN, CALL 911 AND REPEAT DOSE. MAX 3 TABS IN 15 MINUTES   ? NOVOLOG FLEXPEN 100 unit/mL injection PEN three Units. Inject 8 units with breakfast, 8 units with lunch, and 15 units with supper  Indications: 3 units at??lunch, 18 units with dinner   ? pantoprazole DR (PROTONIX) 20 mg tablet Take one tablet by mouth daily.   ? travoprost (TRAVATAN Z) 0.004 % ophthalmic solution Apply one drop to both eyes twice daily.   ? ubidecarenone (COENZYME Q10 PO) Take 1 capsule by mouth daily.

## 2022-06-04 ENCOUNTER — Encounter: Admit: 2022-06-04 | Discharge: 2022-06-04 | Payer: MEDICARE

## 2022-06-04 MED ORDER — EZETIMIBE 10 MG PO TAB
10 mg | ORAL_TABLET | Freq: Every day | ORAL | 3 refills
Start: 2022-06-04 — End: ?

## 2022-06-18 ENCOUNTER — Encounter: Admit: 2022-06-18 | Discharge: 2022-06-18 | Payer: MEDICARE

## 2022-07-02 ENCOUNTER — Encounter: Admit: 2022-07-02 | Discharge: 2022-07-02 | Payer: MEDICARE

## 2022-07-05 ENCOUNTER — Ambulatory Visit: Admit: 2022-07-05 | Discharge: 2022-07-05 | Payer: MEDICARE

## 2022-07-05 ENCOUNTER — Encounter: Admit: 2022-07-05 | Discharge: 2022-07-05 | Payer: MEDICARE

## 2022-07-05 DIAGNOSIS — I10 Essential (primary) hypertension: Secondary | ICD-10-CM

## 2022-07-05 DIAGNOSIS — I6522 Occlusion and stenosis of left carotid artery: Secondary | ICD-10-CM

## 2022-07-05 DIAGNOSIS — I6529 Occlusion and stenosis of unspecified carotid artery: Secondary | ICD-10-CM

## 2022-07-05 DIAGNOSIS — Z9889 Other specified postprocedural states: Secondary | ICD-10-CM

## 2022-07-05 DIAGNOSIS — I480 Paroxysmal atrial fibrillation: Secondary | ICD-10-CM

## 2022-07-05 DIAGNOSIS — E119 Type 2 diabetes mellitus without complications: Secondary | ICD-10-CM

## 2022-07-05 DIAGNOSIS — R9439 Abnormal result of other cardiovascular function study: Secondary | ICD-10-CM

## 2022-07-05 DIAGNOSIS — J45909 Unspecified asthma, uncomplicated: Secondary | ICD-10-CM

## 2022-07-05 DIAGNOSIS — R011 Cardiac murmur, unspecified: Secondary | ICD-10-CM

## 2022-07-05 DIAGNOSIS — K219 Gastro-esophageal reflux disease without esophagitis: Secondary | ICD-10-CM

## 2022-07-05 DIAGNOSIS — H9190 Unspecified hearing loss, unspecified ear: Secondary | ICD-10-CM

## 2022-07-05 DIAGNOSIS — E785 Hyperlipidemia, unspecified: Secondary | ICD-10-CM

## 2022-07-05 DIAGNOSIS — I2510 Atherosclerotic heart disease of native coronary artery without angina pectoris: Secondary | ICD-10-CM

## 2022-07-05 DIAGNOSIS — I2089 Angina of effort: Secondary | ICD-10-CM

## 2022-07-05 DIAGNOSIS — T50905A Adverse effect of unspecified drugs, medicaments and biological substances, initial encounter: Secondary | ICD-10-CM

## 2022-07-05 DIAGNOSIS — I7789 Other specified disorders of arteries and arterioles: Secondary | ICD-10-CM

## 2022-07-05 DIAGNOSIS — E7800 Pure hypercholesterolemia, unspecified: Secondary | ICD-10-CM

## 2022-07-05 DIAGNOSIS — U071 COVID-19: Secondary | ICD-10-CM

## 2022-07-05 DIAGNOSIS — I495 Sick sinus syndrome: Secondary | ICD-10-CM

## 2022-07-05 DIAGNOSIS — T82110A Breakdown (mechanical) of cardiac electrode, initial encounter: Secondary | ICD-10-CM

## 2022-07-05 NOTE — Progress Notes
Date of Service: 07/05/2022              Chief Complaint   Patient presents with   ? Carotid Disease       History of Present Illness  76 year old male with hypertension, hyperlipidemia, CAD, type 2 diabetes who underwent a left carotid endarterectomy on 12/02/2021 and presents today in follow-up.  He denies any new strokelike symptoms.  He underwent a carotid duplex today which demonstrates less than 50% stenosis bilaterally.  He denies any symptoms of postprandial abdominal pain or unintentional weight loss.  He denies any symptoms of claudication, rest pain or nonhealing wounds.  He continues take his aspirin and statin as prescribed.      Medical History:   Diagnosis Date   ? Abnormal stress test    ? Acid reflux     tums prn   ? Adverse drug reaction    ? 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 Asthma    ? CAD (coronary artery disease) 08/23/2011   ? Carotid stenosis    ? COVID-19 08/16/2021    no hosp, no SE   ? DM II (diabetes mellitus, type II), controlled (HCC) 10/22/2009    A1c: 06-2021 7.8    FBS:  ~120   ? Dyslipidemia 10/22/2009   ? Heart murmur    ? High cholesterol    ? HLD (hyperlipidemia)    ? 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 HTN (hypertension)    ? Other specified disorders of arteries and arterioles (HCC)    ? Pacemaker lead fracture    ? Paroxysmal A-fib (HCC) 09/03/2011   ? SSS (sick sinus syndrome) [facility])        Surgical History:   Procedure Laterality Date   ? HX CORONARY ARTERY BYPASS GRAFT  2013    @ [place], x 5 grafts   ? PACEMAKER INSERTION  10/2015    PPM at --[employer]   ? 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/24/2016    Performed by Zollie Pee, MD at [facility] CATH LAB   ? Fluoroscopy N/A 03/24/2016    Performed by Zollie Pee, MD at [facility] CATH LAB   ? Removal Implantable Loop Recorder  03/24/2016    Performed by Zollie Pee, MD at [facility] CATH LAB   ? KNEE REPLACEMENT Bilateral 06/2020    10-2020   ? ENDARTERECTOMY CAROTID Left 12/02/2021    Performed by Dewitt Rota, DO at [facility] OR   ? PACEMAKER PLACEMENT  12/15/2021    replacement   ? REMOVAL PERMANENT PACEMAKER GENERATOR N/A 12/15/2021    Performed by Janalyn Rouse, MD at [facility] CVOR   ? REMOVAL PERMANENT PACEMAKER LEAD - DUAL LEAD SYSTEM N/A 12/15/2021    Performed by Janalyn Rouse, MD at [facility] CVOR   ? INSERTION/ REPLACEMENT PERMANENT PACEMAKER WITH ATRIAL AND VENTRICULAR LEAD N/A 12/15/2021    Performed by Janalyn Rouse, MD at [facility] CVOR   ? INSERTION/ REPLACEMENT TEMPORARY PACEMAKER LEAD/ CATHETER N/A 12/15/2021    Performed by Janalyn Rouse, MD at [facility] CVOR   ? TRANSESOPHAGEAL ECHOCARDIOGRAM DURING INTERVENTION N/A 12/15/2021    Performed by Janalyn Rouse, MD at [facility] CVOR   ? CARDIOTHORACIC SURGERY STANDBY N/A 12/15/2021    Performed by Janalyn Rouse, MD at [facility] CVOR   ? CARDIAC CATHERIZATION     ? CARDIOVASCULAR STRESS TEST     ? CARPAL TUNNEL RELEASE  Bilateral    ? CATARACT REMOVAL WITH IMPLANT     ? COLONOSCOPY     ? DOPPLER ECHOCARDIOGRAPHY     ? ELECTROCARDIOGRAM     ? FINGER SURGERY      right hand 5th finger laceration repair   ? HX HEART CATHETERIZATION     ? HX HERNIA REPAIR Left    ? HX KNEE SURGERY      right   ? HX ROTATOR CUFF REPAIR Bilateral    ? LEFT HEART CATHETERIZATION     ? MYOCARDIAL PERFUSION IMG STUDY     ? US DUPLEX SCAN CAROTID BILATERAL         Allergies:  Allergies   Allergen Reactions   ? Metformin DIARRHEA and STOMACH UPSET   ? Morphine NAUSEA AND VOMITING   ? Ragweed EYE IRRITATION and SNEEZING       Medication List:  ? acetaminophen (TYLENOL) 325 mg tablet Take two tablets by mouth every 4 hours as needed.   ? apixaban (ELIQUIS) 5 mg tablet Take one tablet by mouth twice daily.   ? aspirin EC 81 mg tablet Take one tablet by mouth daily. Take with food.   ? atorvastatin (LIPITOR) 80 mg tablet Take one tablet by mouth at bedtime daily.   ? candesartan (ATACAND) 32 mg tablet TAKE ONE-HALF TABLET BY MOUTH DAILY   ? cetirizine (ZYRTEC) 10 mg tablet Take one tablet by mouth daily as needed.   ? cholecalciferol (Vitamin D3) (VITAMIN D-3) 1,000 units tablet Take one tablet by mouth daily.     ? citalopram (CELEXA) 40 mg tablet Take one-half tablet by mouth daily.   ? cyanocobalamin (vitamin B-12) 1,000 mcg tablet Take one tablet by mouth daily.   ? dorzolamide (TRUSOPT) 2 % ophthalmic solution Apply one drop to both eyes twice daily.   ? ezetimibe (ZETIA) 10 mg tablet TAKE ONE TABLET BY MOUTH DAILY   ? insulin glargine-yfgn (SEMGLEE PEN) 100 unit/mL (3 mL) injectable PEN Inject thirty two Units under the skin daily.   ? JARDIANCE 25 mg tablet    ? metoprolol tartrate (LOPRESSOR) 50 mg tablet Take one-half tablet by mouth twice daily.   ? nitroglycerin (NITROSTAT) 0.4 mg tablet DISSOLVE 1 TAB UNDER TONGUE FOR CHEST PAIN - IF PAIN REMAINS AFTER 5 MIN, CALL 911 AND REPEAT DOSE. MAX 3 TABS IN 15 MINUTES   ? NOVOLOG FLEXPEN 100 unit/mL injection PEN three Units. Inject 8 units with breakfast, 8 units with lunch, and 15 units with supper  Indications: 3 units at??lunch, 18 units with dinner   ? pantoprazole DR (PROTONIX) 20 mg tablet Take one tablet by mouth daily.   ? travoprost (TRAVATAN Z) 0.004 % ophthalmic solution Apply one drop to both eyes twice daily.   ? ubidecarenone (COENZYME Q10 PO) Take 1 capsule by mouth daily.       Social History:   reports that he has never smoked. He has never used smokeless tobacco. He reports that he does not drink alcohol and does not use drugs.    Family History   Problem Relation Age of Onset   ? Cancer Mother         colon cancer   ? Hypertension Mother    ? Heart murmur Mother    ? Cancer Father         prostate   ? Hypertension Father    ? Stroke Father    ? Cancer-Prostate Father    ? Heart murmur Brother    ?  Cancer-Prostate Brother    ? Hypertension Brother        Review of Systems  See HPI          Vitals:    07/05/22 0948 07/05/22 0949   BP: 126/69 123/65   BP Source: Arm, Right Upper Arm, Left Upper   Pulse: 61 62   PainSc: Zero    Weight: 84.4 kg (186 lb) Height: 177.8 cm (5' 10)      Body mass index is 26.69 kg/m?Marland Kitchen     Physical Exam  General: alert and oriented, no acute distress, conversant  Neck: supple, no JVD, no carotid bruit, well healed left neck incision  CV: regular, no murmur  Resp: clear to auscultation bilaterally  Abdomen: soft, nontender, nondistended, no guarding or rebound, no pulsatile mass  Ext: no clubbing, cyanosis or edema  Vasc: 2+ radial, femoral, DP/PT pulses b/l  Skin: no rashes, lesions, or wounds  Neuro: sensation and motor grossly intact throughout  Psych: normal mood and affect        Assessment and Plan:    1. Left carotid stenosis  VAS US DUPLEX SCAN CAROTID BILATERAL      2. H/O carotid endarterectomy  VAS US DUPLEX SCAN CAROTID BILATERAL         76 year old male with hypertension, hyperlipidemia and diabetes status post left carotid endarterectomy in 11/2021. Duplex shows no recurrent stenosis. Continue medical management with ASA and statin. Goal hba1c <7. Follow up in one year with repeat carotid duplex.    Verlon Au, DO

## 2022-08-13 ENCOUNTER — Encounter: Admit: 2022-08-13 | Discharge: 2022-08-13 | Payer: MEDICARE

## 2022-08-13 ENCOUNTER — Ambulatory Visit: Admit: 2022-08-13 | Discharge: 2022-08-13 | Payer: MEDICARE

## 2022-08-13 DIAGNOSIS — I480 Paroxysmal atrial fibrillation: Secondary | ICD-10-CM

## 2022-09-02 ENCOUNTER — Encounter: Admit: 2022-09-02 | Discharge: 2022-09-02 | Payer: MEDICARE

## 2022-09-07 ENCOUNTER — Encounter: Admit: 2022-09-07 | Discharge: 2022-09-07 | Payer: MEDICARE

## 2022-09-07 ENCOUNTER — Ambulatory Visit: Admit: 2022-09-07 | Discharge: 2022-09-08 | Payer: MEDICARE

## 2022-09-07 DIAGNOSIS — R011 Cardiac murmur, unspecified: Secondary | ICD-10-CM

## 2022-09-07 DIAGNOSIS — E119 Type 2 diabetes mellitus without complications: Secondary | ICD-10-CM

## 2022-09-07 DIAGNOSIS — I495 Sick sinus syndrome: Secondary | ICD-10-CM

## 2022-09-07 DIAGNOSIS — I2510 Atherosclerotic heart disease of native coronary artery without angina pectoris: Secondary | ICD-10-CM

## 2022-09-07 DIAGNOSIS — I2089 Angina of effort: Secondary | ICD-10-CM

## 2022-09-07 DIAGNOSIS — T50905A Adverse effect of unspecified drugs, medicaments and biological substances, initial encounter: Secondary | ICD-10-CM

## 2022-09-07 DIAGNOSIS — K219 Gastro-esophageal reflux disease without esophagitis: Secondary | ICD-10-CM

## 2022-09-07 DIAGNOSIS — R9439 Abnormal result of other cardiovascular function study: Secondary | ICD-10-CM

## 2022-09-07 DIAGNOSIS — Z950 Presence of cardiac pacemaker: Secondary | ICD-10-CM

## 2022-09-07 DIAGNOSIS — I6522 Occlusion and stenosis of left carotid artery: Secondary | ICD-10-CM

## 2022-09-07 DIAGNOSIS — E7800 Pure hypercholesterolemia, unspecified: Secondary | ICD-10-CM

## 2022-09-07 DIAGNOSIS — T82110A Breakdown (mechanical) of cardiac electrode, initial encounter: Secondary | ICD-10-CM

## 2022-09-07 DIAGNOSIS — I10 Essential (primary) hypertension: Secondary | ICD-10-CM

## 2022-09-07 DIAGNOSIS — I480 Paroxysmal atrial fibrillation: Secondary | ICD-10-CM

## 2022-09-07 DIAGNOSIS — H9190 Unspecified hearing loss, unspecified ear: Secondary | ICD-10-CM

## 2022-09-07 DIAGNOSIS — E785 Hyperlipidemia, unspecified: Secondary | ICD-10-CM

## 2022-09-07 DIAGNOSIS — Z9889 Other specified postprocedural states: Secondary | ICD-10-CM

## 2022-09-07 DIAGNOSIS — I6529 Occlusion and stenosis of unspecified carotid artery: Secondary | ICD-10-CM

## 2022-09-07 DIAGNOSIS — J45909 Unspecified asthma, uncomplicated: Secondary | ICD-10-CM

## 2022-09-07 DIAGNOSIS — U071 COVID-19: Secondary | ICD-10-CM

## 2022-09-07 DIAGNOSIS — I7789 Other specified disorders of arteries and arterioles: Secondary | ICD-10-CM

## 2022-09-07 MED ORDER — MECLIZINE 12.5 MG PO TAB
12.5 mg | ORAL_TABLET | Freq: Three times a day (TID) | ORAL | 0 refills | 10.00000 days | Status: AC | PRN
Start: 2022-09-07 — End: ?

## 2022-09-07 NOTE — Progress Notes
Date of Service: 09/07/2022    Marc Nelson Marc Nelson is a 77 y.o. male.       HPI     It was a pleasure to see Marc Nelson in my office today.    He is a very pleasant 77 year old gentleman with a history of coronary artery disease s/p previous bypass surgery, mild to moderate aortic stenosis, carotid artery disease s/p left carotid endarterectomy, complete heart block s/p permanent pacemaker along with a history of paroxysmal atrial fibrillation.  Patient had lead malfunction so had an atrial and ventricular lead replaced with placement of new pacemaker back in May of last year.    Since his last visit with me he has had no chest pains or any significant shortness of breath.  Because of his previous knee surgeries he is not very imbalanced on his legs but has had no falls.  Continues to have this episodes of dizziness.  He has had this for a long time.  Never tried Antivert.  I will give him a prescription of Antivert to be used on a as needed basis.    Meanwhile his pacemaker was interrogated in December 2023 and the device was functioning good.  No events were noted.  Patient also remains pretty much asymptomatic other than this chronic complaints of on and off mild dizziness.  Once again dizziness is not related to any orthostatic changes.    Blood pressures are doing good.  Last lipid profile shows excellent numbers.  Last echocardiogram in April showed mild to moderate aortic stenosis with normal left ventricular function.    Recommendations:  1.  I have sent in a prescription for Antivert 12.5 mg to be used as needed basis not more than 2 or 3 times a day.  2.  Rest of the medications to continue.  3.  Patient counseled about symptoms of exertional angina or any exertional shortness of breath.  In the event of any changes in symptoms patient will let me know.  Otherwise follow-up in 6 months.  Will plan to repeat an echo Doppler in 6 months.    Total time spent with Dorene Sorrow including medication changes and counseling was 35 minutes in the clinic.             Vitals:    09/07/22 0834 09/07/22 0842   BP: 130/80 128/80   BP Source: Arm, Left Upper Arm, Right Upper   Pulse: 70    SpO2: 96%    PainSc: Zero    Weight: 82.9 kg (182 lb 12.8 oz)    Height: 177.8 cm (5' 10)      Body mass index is 26.23 kg/m?Marland Kitchen     Past Medical History  Patient Active Problem List    Diagnosis Date Noted    Pacemaker lead malfunction, initial encounter 12/15/2021    Asymptomatic stenosis of left carotid artery 11/27/2021    Pacemaker lead fracture 11/10/2021    Left-sided carotid artery disease (HCC) 11/10/2021    Hyperlipemia 06/30/2017    Sinus node dysfunction (HCC) 05/25/2016     Pacemaker in place.       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 Bernette Mayers      Essential hypertension 05/25/2016    Bradycardia 03/24/2016     03/24/2016  dual chamber MDT ppm implanted, ILR removed  S/P CABG (coronary artery bypass graft) 04/22/2015    Dizziness 12/27/2013    Paroxysmal A-fib (HCC) 09/03/2011     06/20/2012 Holter monitor: revealed normal sinus rhythm, short runs of supraventricular arrhythmias, maximum of 10 beats were noted. There were no episodes of any sustained ventricular or supraventricular arrhythmias noted.   06/21/2012 Echo: EF 60 %, normal LV systolic function. Mild mitral and tricuspid regurgitations. PA pressures were 33 mmHg. LA size 5.4 cm.   07/23/2013 Stress thallium: The patient exercised for 11 minute and 54 seconds. During peak exercise, there was 1 mm horizontal ST depression in II, III, aVF, and V5 and V6. However, SPECT images did not show any significant ischemia. Ejection fraction was noted to be 61 percent.   12/27/13 Holter: The predominating rhythm was NS. Rare Supraventricular Ectopic Beats were observed including Atrial Couplets and a Triplet. Rare Ventricular Ectopic Beats were recognized including Rare Couplets. Patient complained of SOB and weakness. No correlation of his symptoms were noted to any underlying arrhythmia.   06/21/2014 Exercise stress thallium: noted to be normal.   05/06/15 Echo: The LV EF is about 60%. The LA is mildly enlarged. Mild MR & mild TR noted. The PA systolic pressure is normal. LA size 4.5 cm.   10/20/2015 Regadenoson stress Cardiolite [facility]): no evidence of any significant ischemia and normal ejection fraction at 69%.  10/24/2015 Echo: normal left ventricular function.  Ejection fraction 65%.  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 LA size 4.3 cm.      11/24/2015 30-day looping event monitor: multiple episodes of atrial fibrillation and flutter with ventricular rates ranging between 80-110 beats per minute.  One episode of atrial fibrillation was associated with complaints of heart racing  01/06/16 s/p Medtronic LINQ cardiac memory loop recorder at the anterior chest wall (Dr. Bernette Mayers)       Thrombocytopenia (HCC) 08/26/2011    AKI (acute kidney injury) (HCC) 08/26/2011    CAD (coronary artery disease) 08/23/2011    Chest pain 07/30/2011    SOB (shortness of breath) 07/15/2011    Dyslipidemia 10/22/2009    DM II (diabetes mellitus, type II), controlled (HCC) 10/22/2009    HTN, goal below 140/90 01/02/2009         Review of Systems   Constitutional: Negative.   HENT: Negative.     Eyes: Negative.    Cardiovascular:  Positive for dyspnea on exertion.   Respiratory: Negative.     Endocrine: Negative.    Hematologic/Lymphatic: Negative.    Skin: Negative.    Gastrointestinal: Negative.    Genitourinary: Negative.    Neurological:  Positive for dizziness, headaches and loss of balance.   Psychiatric/Behavioral: Negative. Allergic/Immunologic: Negative.      Physical Exam  General Appearance: resting comfortably, no acute distress   Neck Veins: neck veins are not distended   Thyroid: no nodules, masses, tenderness or enlargement   Chest Inspection: chest is normal in appearance, CABG incision   Respiratory Effort: breathing is unlabored, no respiratory distress   Auscultation/Percussion: lungs clear to auscultation, no rales, rhonchi, or wheezing   PMI: PMI not enlarged or displaced   Cardiac Rhythm: regular rhythm and normal rate   Cardiac Auscultation: Normal S1 & S2, no S3 or S4, no rub   Murmurs: soft systolic ejection murmur 2/6 at apex   Carotid Arteries: normal carotid upstroke bilaterally,  radiation of the systolic ejection murmur noted over both carotids.  Pedal Pulses: normal symmetric pedal pulses   Lower Extremity Edema: no lower extremity edema   Abdominal Exam: soft, non-tender, no masses, bowel sounds normal   Abdominal Aorta: nonpalpable abdominal aorta; no abdominal bruits   Liver & Spleen: no organomegaly   Neurologic Exam: neurological assessment grossly intact   Orientation: oriented to time, place and person     Cardiovascular Studies  June 20, 2012: A 24-hour Holter monitor revealed normal sinus rhythm, short runs of supraventricular arrhythmias, maximum of 10 beats were noted. There were no episodes of any sustained ventricular or supraventricular arrhythmias noted. June 21, 2012: Echo Doppler ejection fraction 60 percent, normal left ventricular systolic function. Mild mitral and tricuspid regurgitations. PA pressures were 33 mmHg. July 23, 2013, stress thallium: The patient exercised for 11 minute and 54 seconds. During peak exercise, there was 1 mm horizontal ST depression in II, III, aVF, and V5 and V6. However, SPECT images did not show any significant ischemia. Ejection fraction was noted to be 61 percent. June 21, 2014, exercise stress thallium noted to be normal. EKG today shows sinus bradycardia, rate of 46 beats per minute. First-degree AV block. No significant ST-T changes are noted. No significant changes compared to patient's previous electrocardiogram done on June 19, 2014   October 20, 2015, regadenoson stress Cardiolite done at [facility] revealed no evidence of any significant ischemia and normal ejection fraction at 69%.  October 24, 2015, echo Doppler shows normal left ventricular function.  Ejection fraction 65%.  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    November 24, 2015, a 30-day looping event monitor showed multiple episodes of atrial fibrillation and flutter with ventricular rates ranging between 80-110 beats per minute. One episode of atrial fibrillation was associated with complaints of heart racing.  May 15, 2018 stress thallium: No evidence of any significant ischemia.  Normal left ventricular function.  May 13, 2018 echo Doppler: Normal left ventricular function ejection fraction 60%.  Normal diastolic function.  Normal left atrial pressure.  Mild mitral regurgitation and mild tricuspid regurgitation.  Aortic valve sclerosis with slightly reduced opening.  November 22, 2019 echocardiogram: Normal left ventricular function.  Mild aortic stenosis.  November 22, 2019 stress thallium: No evidence of any significant ischemia.  December 05, 2020 echocardiogram: Normal ventricular size with mild concentric left ventricular hypertrophy.  Left ventricular systolic function is normal with ejection fraction of 60 to 65%. Diastolic function is indeterminate.  Moderate dilatation of the left atrium.  Aortic valve is sclerotic with slightly reduced opening.  No significant change from last year.  Estimated PA pressures are 32 mmHg.  August 21, 2021 stress MPI:  SUMMARY/OPINION:  This study is probably normal with no evidence of significant myocardial ischemia.  There is a small sized, mild intensity, reversible perfusion defect in the apical inferior wall that is more prominent on the stress upright images compared to the stress supine images.  There is no associated regional wall motion abnormality.  This is likely suggestive of diaphragmatic attenuation artifact.  Left ventricular systolic function is normal. There are no high risk prognostic indicators present.  The pharmacologic ECG portion of the study is nondiagnostic for ischemia  September 18, 2021 echocardiogram:  The left ventricular systolic function is normal. The visually estimated ejection fraction is 65%.  Grade  I (mild) left ventricular diastolic dysfunction. Normal left atrial pressure.  The right ventricle is mildly dilated with preserved systolic function. Pacemaker lead present in the ventricle.  Biatrial dilatation (LA-moderately dilated, RA-mildly dilated).  There is mild mitral annular calcification without stenosis.  Trace mitral valve regurgitation, mild tricuspid valve regurgitation.  Severely sclerotic aortic valve, mild stenosis (MG ~ 14 mmHg, peak velocity= 2.2 m/sec, DI= 0.41 , AVA= 1.4 cm?), trace regurgitation.  Estimated Peak Systolic PA Pressure 33 mmHg  September 18, 2021 carotid duplex:  Mild-moderate atheromatous plaque visualized in bilateral common and internal carotid arteries.  The proximal internal carotid artery has severe heterogeneous plaque with turbulent flow and hemodynamic evidence suggestive of 80-99% stenosis.  Monophasic flow distal to the stenosis. (Severity of stenosis increased compared to prior study 08/23/2011)  There is normal antegrade flow in bilateral vertebral arteries  No evidence of proximal subclavian stenosis bilaterally  October 01, 2021 pacemaker interrogation: Device functioning good  October 23, 2021 CTA of the neck:  1. Calcified atheromatous plaque in proximal left internal carotid artery   with 99% stenosis of the distal bulb.   2. Atheromatous irregularity of the proximal right internal carotid artery   with 50% stenosis.   3. Calcified plaque in proximal right vertebral artery with greater than   90% stenosis.   4. 50% stenosis proximal left vertebral artery   November 27, 2021 Echo:  Left ventricular systolic function is borderline. The visually estimated ejection fraction is 50%. The ejection fraction by Simpson's biplane method is 54%. 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 Elevated left atrial pressure  The right ventricle is mildly dilated. 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  LV systolic function was more dynamic in that study.  The aortic valve appeared similar visually and the measured peak velocity was ~2.2-2.5 m/s.  Left atrial pressure estimation is higher in the current study--the average E/e' ratio in the previous study was ~= 9.  December 02, 2021 left carotid endarterectomy.  Dec 15, 2021 atrial and ventricular lead extraction with placement of a new dual-chamber pacemaker generator.  August 13, 2022 pacemaker interrogation: Device functioning good.  EKG today shows AV paced rhythm at a rate of 70 bpm.  1 premature ventricular contraction is noted.    Cardiovascular Health Factors  Vitals BP Readings from Last 3 Encounters:   09/07/22 128/80   07/05/22 123/65   05/18/22 127/61     Wt Readings from Last 3 Encounters:   09/07/22 82.9 kg (182 lb 12.8 oz)   07/05/22 84.4 kg (186 lb)   05/18/22 84.7 kg (186 lb 12.8 oz)     BMI Readings from Last  3 Encounters:   09/07/22 26.23 kg/m?   07/05/22 26.69 kg/m?   05/18/22 26.80 kg/m?      Smoking Social History     Tobacco Use   Smoking Status Never   Smokeless Tobacco Never      Lipid Profile Cholesterol   Date Value Ref Range Status   04/07/2022 84  Final     HDL   Date Value Ref Range Status   04/07/2022 34  Final     LDL   Date Value Ref Range Status   04/07/2022 41  Final     Triglycerides   Date Value Ref Range Status   04/07/2022 43  Final      Blood Sugar Hemoglobin A1C   Date Value Ref Range Status   07/27/2022 7.7 (H)  Final     Glucose   Date Value Ref Range Status   07/27/2022 253 (H)  Final   04/23/2022 122 (H)  Final   12/16/2021 160 (H) 70 - 100 MG/DL Final     Glucose, POC   Date Value Ref Range Status   12/16/2021 151 (H) 70 - 100 MG/DL Final   16/05/9603 540 (H) 70 - 100 MG/DL Final   98/06/9146 829 (H) 70 - 100 MG/DL Final          Problems Addressed Today  Encounter Diagnoses   Name Primary?    Paroxysmal A-fib (HCC) Yes       Assessment and Plan     1.  Coronary artery disease status post coronary artery bypass graft surgery.  Denies any chest discomfort.  Recent stress thallium did not reveal any significant ischemia.  2.  Paroxysmal atrial fibrillation patient maintaining a AV paced rhythm.  EKG is as noted above.  3.  Sick sinus syndrome status atrial and ventricular lead replacement as well as placement of a new pacemaker generator.  Device is functioning good.  4.  Hypertension.  Blood pressures are very well controlled.  5.  Dyslipidemia.  Good lipid numbers.  6.  Diabetes mellitus.  Patient follows his PCP.  7.  High-grade calcified plaque in the proximal left internal carotid artery with 99% stenosis.  Patient is s/p left carotid endarterectomy.            Current Medications (including today's revisions)   acetaminophen (TYLENOL) 325 mg tablet Take two tablets by mouth every 4 hours as needed.    apixaban (ELIQUIS) 5 mg tablet Take one tablet by mouth twice daily.    aspirin EC 81 mg tablet Take one tablet by mouth daily. Take with food.    atorvastatin (LIPITOR) 80 mg tablet Take one tablet by mouth at bedtime daily.    candesartan (ATACAND) 32 mg tablet TAKE ONE-HALF TABLET BY MOUTH DAILY    cetirizine (ZYRTEC) 10 mg tablet Take one tablet by mouth daily as needed.    cholecalciferol (Vitamin D3) (VITAMIN D-3) 1,000 units tablet Take one tablet by mouth daily.      citalopram (CELEXA) 40 mg tablet Take one-half tablet by mouth daily.    cyanocobalamin (vitamin B-12) 1,000 mcg tablet Take one tablet by mouth daily.    dorzolamide (TRUSOPT) 2 % ophthalmic solution Apply one drop to both eyes twice daily.    ezetimibe (ZETIA) 10 mg tablet TAKE ONE TABLET BY MOUTH DAILY    insulin glargine-yfgn (SEMGLEE PEN) 100 unit/mL (3 mL) injectable PEN Inject thirty two Units under the skin daily.    JARDIANCE 25 mg tablet Take one  tablet by mouth daily.    metoprolol tartrate (LOPRESSOR) 50 mg tablet Take one-half tablet by mouth twice daily.    nitroglycerin (NITROSTAT) 0.4 mg tablet DISSOLVE 1 TAB UNDER TONGUE FOR CHEST PAIN - IF PAIN REMAINS AFTER 5 MIN, CALL 911 AND REPEAT DOSE. MAX 3 TABS IN 15 MINUTES    NOVOLOG FLEXPEN 100 unit/mL injection PEN three Units. Inject 8 units with breakfast, 8 units with lunch, and 15 units with supper  Indications: 3 units at  lunch, 18 units with dinner    pantoprazole DR (PROTONIX) 20 mg tablet Take one tablet by mouth daily.    travoprost (TRAVATAN Z) 0.004 % ophthalmic solution Apply one drop to both eyes twice daily.    ubidecarenone (COENZYME Q10 PO) Take 1 capsule by mouth daily.

## 2022-09-07 NOTE — Patient Instructions
Follow-Up:    -Thank you for allowing [place] to participate in your care today. Your After Visit Summary is being completed by Marland Kitchen, RN.    -We would like you to follow up in  6 months with Myriam Jacobson, MD  -The schedule is released approximately 4-5 months in advance. You will be called by our scheduling department to make an appointment and you will also receive a notification via MyChart to self-schedule.  However, if you would like to call to make this appointment, please call [phone].      Changes From Today's Office Visit   START meclizine for dizziness, take as needed up to three times a day    Contacting our office:    -For NON-URGENT questions please contact us via message through your MyChart account.   -For all medication refills please contact your pharmacy or send a request through MyChart.     -For all questions that may need to be addressed urgently please call the [facility] nursing triage line at [phone] Monday - Friday 8am-5pm only. Please leave a detailed message with your name, date of birth, and reason for your call.  If your message is received before 3:30pm, every effort will be made to call you back the same day.  Please allow time for [place] to review your chart prior to call back.     -Should you have an urgent concern over the weekend/nights, the on-call triage line is [phone].    Sher[facility] Hail nursing team fax number: [phone]    -You may receive a survey in the upcoming weeks from [facility]. Your feedback is important to [place] and helps [place] continue to improve patient care and patient satisfaction.     -Please feel free to call our Financial Department at [phone] with any questions or concerns about estimated cost of testing or imaging ordered today. We are happy to provide CPT codes upon request.    Results & Testing Follow Up:    -Please allow 5-7 business days for the results of any testing to be reviewed. Please call our office if you have not heard from a nurse within this time frame.    -Should you choose to complete testing at an outside facility, please contact our office after completion of testing so that we can ensure that we have received results for your provider to review.    Lab and test results:  As a part of the CARES act, starting 11/15/2019, some results will be released to you via MyChart immediately and automatically.  You may see results before your provider sees them; however, your provider will review all these results and then they, or one of their team, will notify you of result information and recommendations.   Critical results will be addressed immediately, but otherwise, please allow [place] time to get back with you prior to you reaching out to [place] for questions.  This will usually take about 72 hours for labs and 5-7 days for procedure test results.

## 2022-09-08 DIAGNOSIS — I480 Paroxysmal atrial fibrillation: Secondary | ICD-10-CM

## 2022-09-16 ENCOUNTER — Encounter: Admit: 2022-09-16 | Discharge: 2022-09-16 | Payer: MEDICARE

## 2022-10-14 ENCOUNTER — Encounter: Admit: 2022-10-14 | Discharge: 2022-10-14 | Payer: MEDICARE

## 2022-10-14 NOTE — Progress Notes
Received notification that  [employer]tr[employer] Carelink has not been connected since 09/28/22. Patient was instructed to look at his/her transmitter to make sure that it is plugged into power and send a manual transmission to reconnect the transmitter. If he/she has any questions about how to send a transmission or if the transmitter does not appear to be working properly, they need to contact the device company directly. Patient was provided with that contact number. Requested the patient send [place] a MyChart message or contact our device nurses at [phone] to let us know after they have sent their transmission. Mychart sent to pt. BC      Note: Patient needs to send a manual remote interrogation to reestablish communication to his/her remote transmitter.

## 2022-10-16 ENCOUNTER — Encounter: Admit: 2022-10-16 | Discharge: 2022-10-16 | Payer: MEDICARE

## 2022-10-25 ENCOUNTER — Encounter: Admit: 2022-10-25 | Discharge: 2022-10-25 | Payer: MEDICARE

## 2022-10-25 NOTE — Telephone Encounter
10/25/2022 4:38 PM     Pt spouse left VM that pt was seen today by Dr Allenbrand/Endocrine at [facility], his potassium has been high, he would like him to stop his Candesartan/Atacand (chart shows dose is 1/2 tab of 32 mg tab) and is being referred to Nephrology    Last time [facility] sent Rx was 11/2019 , I called and sp w/pt Wife, [facility] [place] [facility], and he's been taking '16mg'$  (1/2 of 32 mg tab)  She said they've been checking the BP at home and it is still good.   I let her know I'd remove it from list, added to intolerance list

## 2022-11-26 ENCOUNTER — Encounter: Admit: 2022-11-26 | Discharge: 2022-11-26 | Payer: MEDICARE

## 2022-11-26 DIAGNOSIS — T1490XA Injury, unspecified, initial encounter: Secondary | ICD-10-CM

## 2022-11-26 LAB — POC GLUCOSE: POC GLUCOSE: 103 mg/dL — ABNORMAL HIGH (ref 70–100)

## 2022-11-26 MED ORDER — MECLIZINE 12.5 MG PO TAB
12.5 mg | Freq: Three times a day (TID) | ORAL | 0 refills | Status: AC | PRN
Start: 2022-11-26 — End: ?

## 2022-11-26 MED ORDER — POLYETHYLENE GLYCOL 3350 17 GRAM PO PWPK
1 | Freq: Every day | ORAL | 0 refills | Status: AC
Start: 2022-11-26 — End: ?
  Administered 2022-11-28: 14:00:00 17 g via ORAL

## 2022-11-26 MED ORDER — METOPROLOL TARTRATE 25 MG PO TAB
25 mg | Freq: Two times a day (BID) | ORAL | 0 refills | Status: AC
Start: 2022-11-26 — End: ?
  Administered 2022-11-27 – 2022-11-29 (×5): 25 mg via ORAL

## 2022-11-26 MED ORDER — FENTANYL CITRATE (PF) 50 MCG/ML IJ SOLN
25-50 ug | INTRAVENOUS | 0 refills | Status: AC | PRN
Start: 2022-11-26 — End: ?
  Administered 2022-11-27 (×3): 50 ug via INTRAVENOUS

## 2022-11-26 MED ORDER — SENNOSIDES-DOCUSATE SODIUM 8.6-50 MG PO TAB
1 | Freq: Two times a day (BID) | ORAL | 0 refills | Status: AC
Start: 2022-11-26 — End: ?
  Administered 2022-11-27: 05:00:00 1 via ORAL

## 2022-11-26 MED ORDER — CITALOPRAM 20 MG PO TAB
20 mg | Freq: Every day | ORAL | 0 refills | Status: AC
Start: 2022-11-26 — End: ?
  Administered 2022-11-27 – 2022-11-29 (×3): 20 mg via ORAL

## 2022-11-26 MED ORDER — ASPIRIN 81 MG PO TBEC
81 mg | Freq: Every day | ORAL | 0 refills | Status: AC
Start: 2022-11-26 — End: ?
  Administered 2022-11-28 – 2022-11-29 (×2): 81 mg via ORAL

## 2022-11-26 MED ORDER — ACETAMINOPHEN 500 MG PO TAB
1000 mg | ORAL | 0 refills | Status: AC | PRN
Start: 2022-11-26 — End: ?

## 2022-11-26 MED ORDER — ONDANSETRON HCL (PF) 4 MG/2 ML IJ SOLN
4 mg | INTRAVENOUS | 0 refills | Status: AC | PRN
Start: 2022-11-26 — End: ?

## 2022-11-26 MED ORDER — LACTATED RINGERS IV SOLP
INTRAVENOUS | 0 refills | Status: AC
Start: 2022-11-26 — End: ?
  Administered 2022-11-27 (×2): 1000.000 mL via INTRAVENOUS

## 2022-11-26 MED ORDER — CETIRIZINE 10 MG PO TAB
10 mg | Freq: Every day | ORAL | 0 refills | Status: AC | PRN
Start: 2022-11-26 — End: ?

## 2022-11-26 MED ORDER — INSULIN ASPART 100 UNIT/ML SC FLEXPEN
0-12 [IU] | Freq: Before meals | SUBCUTANEOUS | 0 refills | Status: AC
Start: 2022-11-26 — End: ?
  Administered 2022-11-27: 23:00:00 6 [IU] via SUBCUTANEOUS

## 2022-11-26 MED ORDER — DEXTROSE 50 % IN WATER (D50W) IV SYRG
12.5-25 g | INTRAVENOUS | 0 refills | Status: AC | PRN
Start: 2022-11-26 — End: ?

## 2022-11-26 MED ORDER — PANTOPRAZOLE 20 MG PO TBEC
20 mg | Freq: Every day | ORAL | 0 refills | Status: AC
Start: 2022-11-26 — End: ?
  Administered 2022-11-27 – 2022-11-29 (×3): 20 mg via ORAL

## 2022-11-26 MED ORDER — APIXABAN 5 MG PO TAB
5 mg | Freq: Two times a day (BID) | ORAL | 0 refills | Status: AC
Start: 2022-11-26 — End: ?
  Administered 2022-11-29 (×2): 5 mg via ORAL

## 2022-11-26 MED ORDER — ATORVASTATIN 40 MG PO TAB
80 mg | Freq: Every evening | ORAL | 0 refills | Status: AC
Start: 2022-11-26 — End: ?
  Administered 2022-11-27 – 2022-11-29 (×3): 80 mg via ORAL

## 2022-11-27 ENCOUNTER — Inpatient Hospital Stay: Admit: 2022-11-27 | Discharge: 2022-11-27 | Payer: MEDICARE

## 2022-11-27 ENCOUNTER — Encounter: Admit: 2022-11-27 | Discharge: 2022-11-27 | Payer: MEDICARE

## 2022-11-27 ENCOUNTER — Inpatient Hospital Stay: Admit: 2022-11-27 | Payer: MEDICARE

## 2022-11-27 MED ORDER — ARTIFICIAL TEARS (PF) SINGLE DOSE DROPS GROUP
OPHTHALMIC | 0 refills | Status: DC
Start: 2022-11-27 — End: 2022-11-27

## 2022-11-27 MED ORDER — PROPOFOL INJ 10 MG/ML IV VIAL
INTRAVENOUS | 0 refills | Status: DC
Start: 2022-11-27 — End: 2022-11-27

## 2022-11-27 MED ORDER — FENTANYL CITRATE (PF) 50 MCG/ML IJ SOLN
INTRAVENOUS | 0 refills | Status: DC
Start: 2022-11-27 — End: 2022-11-27

## 2022-11-27 MED ORDER — LIDOCAINE (PF) 200 MG/10 ML (2 %) IJ SYRG
INTRAVENOUS | 0 refills | Status: DC
Start: 2022-11-27 — End: 2022-11-27

## 2022-11-27 MED ORDER — DEXAMETHASONE SODIUM PHOSPHATE 4 MG/ML IJ SOLN
INTRAVENOUS | 0 refills | Status: DC
Start: 2022-11-27 — End: 2022-11-27

## 2022-11-27 MED ORDER — ONDANSETRON HCL (PF) 4 MG/2 ML IJ SOLN
INTRAVENOUS | 0 refills | Status: DC
Start: 2022-11-27 — End: 2022-11-27

## 2022-11-27 MED ORDER — PHENYLEPHRINE HCL IN 0.9% NACL 1 MG/10 ML (100 MCG/ML) IV SYRG
INTRAVENOUS | 0 refills | Status: DC
Start: 2022-11-27 — End: 2022-11-27

## 2022-11-27 MED ADMIN — SODIUM CHLORIDE 0.9 % IJ SOLN [7319]: 50 mL | INTRAVENOUS | @ 08:00:00 | Stop: 2022-11-27 | NDC 00409488820

## 2022-11-27 MED ADMIN — ENOXAPARIN 30 MG/0.3 ML SC SYRG [85048]: 30 mg | SUBCUTANEOUS | @ 18:00:00 | NDC 71288043280

## 2022-11-27 MED ADMIN — SODIUM CHLORIDE 0.9 % IV PGBK (MB+) [95161]: 4.5 g | INTRAVENOUS | @ 08:00:00 | Stop: 2022-11-28 | NDC 00338915930

## 2022-11-27 MED ADMIN — FENTANYL CITRATE (PF) 50 MCG/ML IJ SOLN [3037]: 25 ug | INTRAVENOUS | @ 16:00:00 | Stop: 2022-11-27 | NDC 00409909412

## 2022-11-27 MED ADMIN — PIPERACILLIN-TAZOBACTAM 4.5 GRAM IV SOLR [80419]: 4.5 g | INTRAVENOUS | @ 08:00:00 | Stop: 2022-11-28 | NDC 60505615900

## 2022-11-27 MED ADMIN — IOHEXOL 350 MG IODINE/ML IV SOLN [81210]: 100 mL | INTRAVENOUS | @ 08:00:00 | Stop: 2022-11-27 | NDC 00407141491

## 2022-11-27 MED ADMIN — FENTANYL CITRATE (PF) 50 MCG/ML IJ SOLN [3037]: 50 ug | INTRAVENOUS | @ 15:00:00 | Stop: 2022-11-27 | NDC 00409909412

## 2022-11-27 MED ADMIN — METOCLOPRAMIDE HCL 5 MG/ML IJ SOLN [5002]: 10 mg | INTRAVENOUS | @ 15:00:00 | Stop: 2022-11-27 | NDC 00703450201

## 2022-11-27 MED ADMIN — LACTATED RINGERS IV SOLP [4318]: 1000.000 mL | INTRAVENOUS | @ 13:00:00 | Stop: 2022-11-27 | NDC 00338011704

## 2022-11-27 MED ADMIN — FENTANYL CITRATE (PF) 50 MCG/ML IJ SOLN [3037]: 25 ug | INTRAVENOUS | @ 15:00:00 | Stop: 2022-11-27 | NDC 00409909412

## 2022-11-27 MED ADMIN — SODIUM CHLORIDE 0.9 % IV PGBK (MB+) [95161]: 4.5 g | INTRAVENOUS | @ 18:00:00 | Stop: 2022-11-28 | NDC 00338915930

## 2022-11-27 MED ADMIN — LIDOCAINE HCL 10 MG/ML (1 %) IJ SOLN [4452]: 30 mL | INTRAMUSCULAR | @ 08:00:00 | Stop: 2022-11-27 | NDC 63323020103

## 2022-11-27 MED ADMIN — PIPERACILLIN-TAZOBACTAM 4.5 GRAM IV SOLR [80419]: 4.5 g | INTRAVENOUS | @ 18:00:00 | Stop: 2022-11-28 | NDC 60505615900

## 2022-11-27 MED ADMIN — SODIUM CHLORIDE 0.9 % IV SOLP [27838]: 250 mL | INTRAVENOUS | @ 18:00:00 | Stop: 2022-11-27 | NDC 00338004902

## 2022-11-27 MED ADMIN — ACETAMINOPHEN 500 MG PO TAB [102]: 1000 mg | ORAL | @ 15:00:00 | Stop: 2022-11-27 | NDC 00904673061

## 2022-11-27 MED ADMIN — OXYCODONE 5 MG PO TAB [10814]: 5 mg | ORAL | @ 16:00:00 | Stop: 2022-11-27 | NDC 00904696661

## 2022-11-27 NOTE — Anesthesia Post-Procedure Evaluation
Post-Anesthesia Evaluation    Name: Marc Nelson      MRN: [id number]     DOB: 04-19-1946     Age: 77 y.o.     Sex: male   __________________________________________________________________________     Procedure Information       Anesthesia Start Date/Time: 11/27/22 0808    Procedure: DEBRIDEMENT WOUND DEEP 20 SQ CM OR LESS - LOWER EXTREMITY (Left: Leg Lower)    Location: MAIN OR 36 / Main OR/Periop    Surgeons: Charlyne Quale, MD            Post-Anesthesia Vitals  BP: 125/71 (04/13 1045)  Pulse: 60 (04/13 1045)  Respirations: 14 PER MINUTE (04/13 1045)  SpO2: 95 % (04/13 1045)  O2 Device: Nasal cannula (04/13 1045)   Vitals Value Taken Time   BP 125/71 11/27/22 1045   Temp 36 C (96.8 F) 11/27/22 0929   Pulse 60 11/27/22 1045   Respirations 14 PER MINUTE 11/27/22 1045   SpO2 95 % 11/27/22 1045   O2 Device Nasal cannula 11/27/22 1045   ABP     ART BP           Post Anesthesia Evaluation Note    Evaluation location: Pre/Post  Patient participation: recovered; patient participated in evaluation  Level of consciousness: sleepy but conscious  Pain management: adequate    Hydration: normovolemia  Temperature: 36.0C - 38.4C  Airway patency: adequate    Perioperative Events       Post-op nausea and vomiting: resolved    Postoperative Status  Cardiovascular status: hemodynamically stable  Respiratory status: spontaneous ventilation and supplemental oxygen (3L NC)        Perioperative Events  There were no known complications for this encounter.

## 2022-11-27 NOTE — Anesthesia Pre-Procedure Evaluation
Anesthesia Pre-Procedure Evaluation    Name: Marc Nelson      MRN: [id number]     DOB: [birth year]     Age: 77 y.o.     Sex: male   _________________________________________________________________________     Procedure Info:   Procedure Information       Date/Time: 11/27/22 0800    Procedure: DEBRIDEMENT WOUND DEEP 20 SQ CM OR LESS - LOWER EXTREMITY (Left)    Location: MAIN OR 36 / Main OR/Periop    Surgeons: Charlyne Quale, MD            Physical Assessment  Vital Signs (last filed in past 24 hours):  BP: (P) 127/64 (04/13 0728)  Temp: 36.5 ?C (97.7 ?F) (04/13 1914)  Pulse: 60 (04/13 0728)  Respirations: 12 PER MINUTE (04/13 0728)  SpO2: 94 % (04/13 0728)  O2 Device: None (Room air) (04/13 0603)  Height: 175.3 cm (5' 9) (04/12 2313)  Weight: 80.3 kg (177 lb 0.5 oz) (04/12 2313)      Patient History   Allergies   Allergen Reactions    Atacand [Candesartan] SEE COMMENTS     hyperkalemia    Metformin DIARRHEA and STOMACH UPSET    Morphine NAUSEA AND VOMITING    Oxycodone NAUSEA AND VOMITING    Ragweed EYE IRRITATION and SNEEZING        Current Medications    Medication Directions   acetaminophen (TYLENOL) 325 mg tablet Take two tablets by mouth every 4 hours as needed.   apixaban (ELIQUIS) 5 mg tablet Take one tablet by mouth twice daily.   aspirin EC 81 mg tablet Take one tablet by mouth daily. Take with food.   atorvastatin (LIPITOR) 80 mg tablet Take one tablet by mouth at bedtime daily.   cetirizine (ZYRTEC) 10 mg tablet Take one tablet by mouth daily as needed.   cholecalciferol (Vitamin D3) (VITAMIN D-3) 1,000 units tablet Take one tablet by mouth daily.     citalopram (CELEXA) 40 mg tablet Take one-half tablet by mouth daily.   cyanocobalamin (vitamin B-12) 1,000 mcg tablet Take one tablet by mouth daily.   dorzolamide (TRUSOPT) 2 % ophthalmic solution Apply one drop to both eyes twice daily.   ezetimibe (ZETIA) 10 mg tablet TAKE ONE TABLET BY MOUTH DAILY   insulin glargine-yfgn (SEMGLEE PEN) 100 unit/mL (3 mL) injectable PEN Inject thirty two Units under the skin daily.   JARDIANCE 25 mg tablet Take one tablet by mouth daily.   meclizine (ANTIVERT) 12.5 mg tablet Take one tablet by mouth three times daily as needed.   metoprolol tartrate (LOPRESSOR) 50 mg tablet Take one-half tablet by mouth twice daily.   nitroglycerin (NITROSTAT) 0.4 mg tablet DISSOLVE 1 TAB UNDER TONGUE FOR CHEST PAIN - IF PAIN REMAINS AFTER 5 MIN, CALL 911 AND REPEAT DOSE. MAX 3 TABS IN 15 MINUTES   NOVOLOG FLEXPEN 100 unit/mL injection PEN three Units. Inject 8 units with breakfast, 8 units with lunch, and 15 units with supper  Indications: 3 units at  lunch, 18 units with dinner   pantoprazole DR (PROTONIX) 20 mg tablet Take one tablet by mouth daily.   travoprost (TRAVATAN Z) 0.004 % ophthalmic solution Apply one drop to both eyes twice daily.   ubidecarenone (COENZYME Q10 PO) Take 1 capsule by mouth daily.         Review of Systems/Medical History      Patient summary reviewed  Nursing notes reviewed  Pertinent labs reviewed    PONV  Screening: Non-smoker    No history of anesthetic complications    No family history of anesthetic complications      Airway - negative        Pulmonary           Asthma (rare albuterol use)      Shortness of breath      Cardiovascular         Exercise tolerance: >4 METS (Based on DASI activity questionnaire completed by patient: METs 6.61)       Beta Blocker therapy: Yes        CIED: pacemaker          Manufacture: Medtronic              Device Indication(s): SSS          Not Pacemaker Dependent                    CIED interrogated last 12 months (09/29/21)          Device settings:  DDDR              Battery life > 1 year              Magnet response: Asynchronous mode and 85 bpm                Hypertension          Valvular problems/murmurs (known murmur):          No past MI    Coronary artery disease      Coronary artery bypass graft (x5 2013)        No palpitations          Dysrhythmias (metoprolol, Eliquis); atrial fibrillation    No angina (denies since CABG 2013)      PVD (Carotid artery stenosis s/p CEA 12/02/2021)      Hyperlipidemia      No orthopnea      GI/Hepatic/Renal             GERD (PPI), well controlled            Neuro/Psych         Sensory deficit (hearing deficit)        Psychiatric history          Depression      Musculoskeletal - negative        Bilateral knee replacement        Endocrine/Other       Diabetes, poorly controlled, type 2, using insulin      Most recent Hgb A1C:7.2 - 9          Constitution - negative       Physical Exam    Airway Findings      Mallampati: III      TM distance: >3 FB      Neck ROM: full      Mouth opening: good      Airway patency: adequate    Dental Findings:       Poor dentition          Cardiovascular Findings:       Rhythm: regular      Rate: normal      Other findings: Murmur (systolic ejection murmur)    Pulmonary Findings:       Breath sounds clear to auscultation.    Abdominal Findings:       Not obese      Abdomen soft  Neurological Findings:       Alert and oriented x 3    Constitutional findings:       No acute distress       Hematology:   Lab Results   Component Value Date    HGB 12.3 11/27/2022    HCT 37.4 11/27/2022    PLTCT 135 11/27/2022    WBC 6.6 11/27/2022    NEUT 49 11/27/2021    ANC 2.65 11/27/2021    ALC 1.90 11/27/2021    MONA 10 11/27/2021    AMC 0.57 11/27/2021    EOSA 7 11/27/2021    ABC 0.02 11/27/2021    MCV 98.6 11/27/2022    MCH 32.5 11/27/2022    MCHC 33.0 11/27/2022    MPV 7.5 11/27/2022    RDW 16.5 11/27/2022         General Chemistry:   Lab Results   Component Value Date    NA 138 11/27/2022    K 4.5 11/27/2022    CL 111 11/27/2022    CO2 20 11/27/2022    GAP 7 11/27/2022    BUN 25 11/27/2022    CR 0.94 11/27/2022    GLU 110 11/27/2022    CA 9.0 11/27/2022    ALBUMIN 3.3 11/27/2022    LACTIC 1.7 12/02/2021    MG 1.8 11/27/2022    TOTBILI 0.8 11/27/2022    PO4 3.8 11/27/2022      Coagulation:   Lab Results   Component Value Date    PTT 71.5 12/02/2021    INR 1.0 12/15/2021    INR 1.1 05/03/2016     TTE 11/27/21  Interpretation Summary  Left ventricular systolic function is borderline. The visually estimated ejection fraction is 50%. The ejection fraction by Simpson's biplane method is 54%. 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 Elevated left atrial pressure  The right ventricle is mildly dilated. 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  LV systolic function was more dynamic in that study.  The aortic valve appeared similar visually and the measured peak velocity was ~2.2-2.5 m/s.  Left atrial pressure estimation is higher in the current study--the average E/e' ratio in the previous study was ~= 9.      Anesthesia Plan    ASA score: 3   Plan: general  Induction method: intravenous  NPO status: acceptable      Informed Consent  Anesthetic plan and risks discussed with patient.  Use of blood products discussed with patient  Blood Consent: consented      Plan discussed with: anesthesiologist and resident.         PAC Plan  Alerts: Yes  CIED

## 2022-11-27 NOTE — Anesthesia Procedure Notes
Procedure: Airway Placement    AIRWAY INSERTION    Date/Time: 11/27/2022 8:20 AM    Patient location: OR  Urgency: elective  Difficult Airway: No            Airway Procedure  Indication(s) for airway management: surgery        no    Preoxygenated: yes  Patient position: sniffing  Neck stabilization: no in-line stabilization    Mask difficulty assessment: 0 - not attempted      Procedure Outcome  Final airway type: supraglottic airway (SGA)          Supraglottic airway: LMA; SGA size: 4;                      Number of attempts at approach: 1            Complications  Cardiovascular:   Pulmonary:   Procedure: airway not difficult  Medication:         Performed by: Arta Silence., CRNA  Authorized by: Stephanie Acre, DO

## 2022-11-28 ENCOUNTER — Encounter: Admit: 2022-11-28 | Discharge: 2022-11-28 | Payer: MEDICARE

## 2022-11-28 MED ADMIN — PIPERACILLIN-TAZOBACTAM 4.5 GRAM IV SOLR [80419]: 4.5 g | INTRAVENOUS | @ 14:00:00 | Stop: 2022-11-28 | NDC 60505615900

## 2022-11-28 MED ADMIN — SENNOSIDES 8.6 MG PO TAB [11349]: 2 | ORAL | @ 02:00:00 | NDC 00904725261

## 2022-11-28 MED ADMIN — HYDROCODONE-ACETAMINOPHEN 5-325 MG PO TAB [34505]: 1 | ORAL | @ 07:00:00 | NDC 60687039611

## 2022-11-28 MED ADMIN — TIZANIDINE 2 MG PO TAB [14792]: 2 mg | ORAL | @ 07:00:00 | NDC 50268075911

## 2022-11-28 MED ADMIN — HYDROCODONE-ACETAMINOPHEN 5-325 MG PO TAB [34505]: 1 | ORAL | @ 14:00:00 | NDC 60687039611

## 2022-11-28 MED ADMIN — ENOXAPARIN 30 MG/0.3 ML SC SYRG [85048]: 30 mg | SUBCUTANEOUS | @ 14:00:00 | Stop: 2022-11-28 | NDC 71288043280

## 2022-11-28 MED ADMIN — SODIUM CHLORIDE 0.9 % IV PGBK (MB+) [95161]: 4.5 g | INTRAVENOUS | @ 06:00:00 | Stop: 2022-11-28 | NDC 00338915930

## 2022-11-28 MED ADMIN — LACTATED RINGERS IV SOLP [4318]: 1000 mL | INTRAVENOUS | @ 16:00:00 | Stop: 2022-11-28 | NDC 00338011704

## 2022-11-28 MED ADMIN — SODIUM CHLORIDE 0.9 % IV PGBK (MB+) [95161]: 4.5 g | INTRAVENOUS | @ 14:00:00 | Stop: 2022-11-28 | NDC 00338915930

## 2022-11-28 MED ADMIN — SENNOSIDES 8.6 MG PO TAB [11349]: 2 | ORAL | @ 14:00:00 | NDC 00904725261

## 2022-11-28 MED ADMIN — SODIUM CHLORIDE 0.9 % IV PGBK (MB+) [95161]: 4.5 g | INTRAVENOUS | @ 02:00:00 | Stop: 2022-11-28 | NDC 00338915930

## 2022-11-28 MED ADMIN — PIPERACILLIN-TAZOBACTAM 4.5 GRAM IV SOLR [80419]: 4.5 g | INTRAVENOUS | @ 02:00:00 | Stop: 2022-11-28 | NDC 60505615900

## 2022-11-28 MED ADMIN — ENOXAPARIN 30 MG/0.3 ML SC SYRG [85048]: 30 mg | SUBCUTANEOUS | @ 02:00:00 | NDC 71288043280

## 2022-11-28 MED ADMIN — PIPERACILLIN-TAZOBACTAM 4.5 GRAM IV SOLR [80419]: 4.5 g | INTRAVENOUS | @ 06:00:00 | Stop: 2022-11-28 | NDC 60505615900

## 2022-11-28 MED FILL — HYDROCODONE-ACETAMINOPHEN 5-325 MG PO TAB: 5/325 mg | ORAL | 5 days supply | Qty: 20 | Fill #1 | Status: CP

## 2022-11-28 MED FILL — SULFAMETHOXAZOLE-TRIMETHOPRIM 800-160 MG PO TAB: 160/800 mg | ORAL | 7 days supply | Qty: 14 | Fill #1 | Status: CP

## 2022-11-28 MED FILL — TIZANIDINE 2 MG PO TAB: 2 mg | ORAL | 5 days supply | Qty: 15 | Fill #1 | Status: CP

## 2022-11-29 ENCOUNTER — Encounter: Admit: 2022-11-29 | Discharge: 2022-11-29 | Payer: MEDICARE

## 2022-11-29 DIAGNOSIS — R9439 Abnormal result of other cardiovascular function study: Secondary | ICD-10-CM

## 2022-11-29 DIAGNOSIS — I2510 Atherosclerotic heart disease of native coronary artery without angina pectoris: Secondary | ICD-10-CM

## 2022-11-29 DIAGNOSIS — E119 Type 2 diabetes mellitus without complications: Secondary | ICD-10-CM

## 2022-11-29 DIAGNOSIS — R011 Cardiac murmur, unspecified: Secondary | ICD-10-CM

## 2022-11-29 DIAGNOSIS — E7800 Pure hypercholesterolemia, unspecified: Secondary | ICD-10-CM

## 2022-11-29 DIAGNOSIS — T82110A Breakdown (mechanical) of cardiac electrode, initial encounter: Secondary | ICD-10-CM

## 2022-11-29 DIAGNOSIS — I7789 Other specified disorders of arteries and arterioles: Secondary | ICD-10-CM

## 2022-11-29 DIAGNOSIS — I6529 Occlusion and stenosis of unspecified carotid artery: Secondary | ICD-10-CM

## 2022-11-29 DIAGNOSIS — J45909 Unspecified asthma, uncomplicated: Secondary | ICD-10-CM

## 2022-11-29 DIAGNOSIS — I10 Essential (primary) hypertension: Secondary | ICD-10-CM

## 2022-11-29 DIAGNOSIS — H9190 Unspecified hearing loss, unspecified ear: Secondary | ICD-10-CM

## 2022-11-29 DIAGNOSIS — I495 Sick sinus syndrome: Secondary | ICD-10-CM

## 2022-11-29 DIAGNOSIS — U071 COVID-19: Secondary | ICD-10-CM

## 2022-11-29 DIAGNOSIS — I2089 Angina of effort (HCC): Secondary | ICD-10-CM

## 2022-11-29 DIAGNOSIS — K219 Gastro-esophageal reflux disease without esophagitis: Secondary | ICD-10-CM

## 2022-11-29 DIAGNOSIS — T50905A Adverse effect of unspecified drugs, medicaments and biological substances, initial encounter: Secondary | ICD-10-CM

## 2022-11-29 DIAGNOSIS — E785 Hyperlipidemia, unspecified: Secondary | ICD-10-CM

## 2022-11-29 DIAGNOSIS — I480 Paroxysmal atrial fibrillation: Secondary | ICD-10-CM

## 2022-11-29 MED ADMIN — SENNOSIDES 8.6 MG PO TAB [11349]: 2 | ORAL | @ 03:00:00 | NDC 00904725261

## 2022-11-29 MED ADMIN — HYDROCODONE-ACETAMINOPHEN 5-325 MG PO TAB [34505]: 1 | ORAL | @ 16:00:00 | Stop: 2022-11-29 | NDC 60687039611

## 2022-11-29 MED ADMIN — ACETAMINOPHEN 325 MG PO TAB [101]: 650 mg | ORAL | @ 03:00:00 | NDC 00904677361

## 2022-11-29 MED ADMIN — TIZANIDINE 2 MG PO TAB [14792]: 2 mg | ORAL | @ 03:00:00 | NDC 50268075911

## 2022-11-30 ENCOUNTER — Encounter: Admit: 2022-11-30 | Discharge: 2022-11-30 | Payer: MEDICARE

## 2022-11-30 DIAGNOSIS — S82892D Other fracture of left lower leg, subsequent encounter for closed fracture with routine healing: Secondary | ICD-10-CM

## 2022-11-30 NOTE — Telephone Encounter
Received a call from [facility] with [facility] for OT request. This was completed and faxed to [phone].

## 2022-12-03 ENCOUNTER — Encounter: Admit: 2022-12-03 | Discharge: 2022-12-03 | Payer: MEDICARE

## 2022-12-03 ENCOUNTER — Ambulatory Visit: Admit: 2022-12-03 | Discharge: 2022-12-04 | Payer: MEDICARE

## 2022-12-03 ENCOUNTER — Ambulatory Visit: Admit: 2022-12-03 | Discharge: 2022-12-03 | Payer: MEDICARE

## 2022-12-03 DIAGNOSIS — H9190 Unspecified hearing loss, unspecified ear: Secondary | ICD-10-CM

## 2022-12-03 DIAGNOSIS — I10 Essential (primary) hypertension: Secondary | ICD-10-CM

## 2022-12-03 DIAGNOSIS — T50905A Adverse effect of unspecified drugs, medicaments and biological substances, initial encounter: Secondary | ICD-10-CM

## 2022-12-03 DIAGNOSIS — S82892D Other fracture of left lower leg, subsequent encounter for closed fracture with routine healing: Secondary | ICD-10-CM

## 2022-12-03 DIAGNOSIS — I7789 Other specified disorders of arteries and arterioles: Secondary | ICD-10-CM

## 2022-12-03 DIAGNOSIS — I2510 Atherosclerotic heart disease of native coronary artery without angina pectoris: Secondary | ICD-10-CM

## 2022-12-03 DIAGNOSIS — U071 COVID-19: Secondary | ICD-10-CM

## 2022-12-03 DIAGNOSIS — R011 Cardiac murmur, unspecified: Secondary | ICD-10-CM

## 2022-12-03 DIAGNOSIS — I2089 Angina of effort (HCC): Secondary | ICD-10-CM

## 2022-12-03 DIAGNOSIS — K219 Gastro-esophageal reflux disease without esophagitis: Secondary | ICD-10-CM

## 2022-12-03 DIAGNOSIS — I480 Paroxysmal atrial fibrillation: Secondary | ICD-10-CM

## 2022-12-03 DIAGNOSIS — I6529 Occlusion and stenosis of unspecified carotid artery: Secondary | ICD-10-CM

## 2022-12-03 DIAGNOSIS — E7800 Pure hypercholesterolemia, unspecified: Secondary | ICD-10-CM

## 2022-12-03 DIAGNOSIS — E119 Type 2 diabetes mellitus without complications: Secondary | ICD-10-CM

## 2022-12-03 DIAGNOSIS — R9439 Abnormal result of other cardiovascular function study: Secondary | ICD-10-CM

## 2022-12-03 DIAGNOSIS — E785 Hyperlipidemia, unspecified: Secondary | ICD-10-CM

## 2022-12-03 DIAGNOSIS — J45909 Unspecified asthma, uncomplicated: Secondary | ICD-10-CM

## 2022-12-03 DIAGNOSIS — I495 Sick sinus syndrome: Secondary | ICD-10-CM

## 2022-12-03 DIAGNOSIS — T82110A Breakdown (mechanical) of cardiac electrode, initial encounter: Secondary | ICD-10-CM

## 2022-12-03 NOTE — Progress Notes
Screening Questions for Patients scheduled within 3 business days for surgery:  Do you have any of the following: (if yes to any, send PP message to APP pool and Teams message to lead APP)    Stroke in past 9 months   No  Heart attack in past 6 months  No   Pacemaker (Need to request interrogation if none w/in 12 months)  Yes: Interrogation 10/16/22  Blood Transfusion    No    In the last month, have you had chest pain, lightheadedness, or trouble breathing? (if yes to any, send PP messages to APP pool and Teams message to lead APP)  Chest pain No   Lightheadedness No  Trouble breathing (baseline for pt?) No      In the last week, have you had a cough, fever, or chills?(Productive?  Worsening or improving? Tested for Covid or Influenza?) No    Do you take any of the below medications? (If yes, send PP message to pharmacy pool and Teams message)  Anticoagulant(s) Yes: Eliquis for a-fib    Antiplatelet(s)  Yes: 81mg  ASA, hold dose the morning of sgy  Patient on insulin having bowel prep  No  Insulin pump No  Mixed insulin (70/30 or 75/25) No  Concentrated insulin (U500)  No  MAO Inhibiters  No  Everolimus  No  Sirolimus  No   Medication for Pulmonary Hypertension  No  Tamoxifin  No   Immunosuppresants/Biologix and Disease modifying agents          No  Instruct patient to defer to surgeons instruction. No pharm consult needed.   Phentermine    No  Instruct patient to hold 4 days. If dose is 4 days or less message APP to notify surgeon  GLP-1s No   Instruct as usual. If dose is 7 days or less message APP to notify surgeon  SGLT-2s Yes: Jardiance-hold 3 days    Instruct as usual. If dose is 3 days or less message APP to notify surgeon    Any problems with anesthesia in the past? (If serious, send PP message to APP pool and Teams message to lead APP)  Anesthesia No     Per patient interview, have they ever been informed of any of the following rare disorders? (if yes to any, send PP message to APP pool and Teams message to lead APP)   No    Hereditary Angioedema  Antiphospholipid Syndrome  Bleeding Disorders (Hemophilia, Von Willibrand's, etc.)  Sickle Cell Disease  Pheochromocytoma  Neuroendocrine Tumor  Grave's Disease (Hyperthyroidism) w/ current symptoms  Anterior Mediastinal Mass / Thymoma  Upper/lower airway tumor or obstruction  Tracheal stenosis  Tracheomalesia  Cystic Fibrosis  Down's Syndrome  Ehlers-Danlos Syndrome  Alpha Gal Allergy

## 2022-12-04 DIAGNOSIS — T1490XA Injury, unspecified, initial encounter: Secondary | ICD-10-CM

## 2022-12-04 DIAGNOSIS — S82302C Unspecified fracture of lower end of left tibia, initial encounter for open fracture type IIIA, IIIB, or IIIC: Secondary | ICD-10-CM

## 2022-12-06 ENCOUNTER — Encounter: Admit: 2022-12-06 | Discharge: 2022-12-06 | Payer: MEDICARE

## 2022-12-06 DIAGNOSIS — S82892D Other fracture of left lower leg, subsequent encounter for closed fracture with routine healing: Secondary | ICD-10-CM

## 2022-12-06 NOTE — Telephone Encounter
PAC, [phone] called to see if OK for pt to hold eliquis from now until after surg/and surgeon ok's resuming.   Was inpt 4/12-4/15  s/p crush injury sustaining a L distal tibia fx and leg laceration.   Eliquis ordered for PAF, has PPM, most recent device check below.   Routing to [facility]    Scheduled tomorrow for:  Surgeon    Charlyne Quale, MD   Procedure Laterality Anesthesia  OPEN TREATMENT BIMALLEOLAR ANKLE FRACTURE WITH/ WITHOUT INTERNAL FIXATION [82956 (CPT)] Left      1.5 hrs        Title: Remote Check    # 10/14/22  Device Interrogation Reviewed by technical staff  * Alerts or Events: 0  * Battery: OK, 11.58 yrs   * Sensing, impedance and thresholds reviewed  * Programmed parameters reviewed  * Presenting rhythm reviewed  AP-VP 80'S  * Heart Rate Histograms reviewed  AP 91.2%  VP 100%

## 2022-12-07 ENCOUNTER — Encounter: Admit: 2022-12-07 | Discharge: 2022-12-07 | Payer: MEDICARE

## 2022-12-07 ENCOUNTER — Ambulatory Visit: Admit: 2022-12-07 | Discharge: 2022-12-07 | Payer: MEDICARE

## 2022-12-07 DIAGNOSIS — E119 Type 2 diabetes mellitus without complications: Secondary | ICD-10-CM

## 2022-12-07 DIAGNOSIS — J45909 Unspecified asthma, uncomplicated: Secondary | ICD-10-CM

## 2022-12-07 DIAGNOSIS — E7800 Pure hypercholesterolemia, unspecified: Secondary | ICD-10-CM

## 2022-12-07 DIAGNOSIS — I10 Essential (primary) hypertension: Secondary | ICD-10-CM

## 2022-12-07 DIAGNOSIS — T50905A Adverse effect of unspecified drugs, medicaments and biological substances, initial encounter: Secondary | ICD-10-CM

## 2022-12-07 DIAGNOSIS — R011 Cardiac murmur, unspecified: Secondary | ICD-10-CM

## 2022-12-07 DIAGNOSIS — I6529 Occlusion and stenosis of unspecified carotid artery: Secondary | ICD-10-CM

## 2022-12-07 DIAGNOSIS — I7789 Other specified disorders of arteries and arterioles: Secondary | ICD-10-CM

## 2022-12-07 DIAGNOSIS — H9190 Unspecified hearing loss, unspecified ear: Secondary | ICD-10-CM

## 2022-12-07 DIAGNOSIS — U071 COVID-19: Secondary | ICD-10-CM

## 2022-12-07 DIAGNOSIS — I2510 Atherosclerotic heart disease of native coronary artery without angina pectoris: Secondary | ICD-10-CM

## 2022-12-07 DIAGNOSIS — I480 Paroxysmal atrial fibrillation: Secondary | ICD-10-CM

## 2022-12-07 DIAGNOSIS — T82110A Breakdown (mechanical) of cardiac electrode, initial encounter: Secondary | ICD-10-CM

## 2022-12-07 DIAGNOSIS — I2089 Angina of effort (HCC): Secondary | ICD-10-CM

## 2022-12-07 DIAGNOSIS — E785 Hyperlipidemia, unspecified: Secondary | ICD-10-CM

## 2022-12-07 DIAGNOSIS — R9439 Abnormal result of other cardiovascular function study: Secondary | ICD-10-CM

## 2022-12-07 DIAGNOSIS — K219 Gastro-esophageal reflux disease without esophagitis: Secondary | ICD-10-CM

## 2022-12-07 DIAGNOSIS — I495 Sick sinus syndrome: Secondary | ICD-10-CM

## 2022-12-07 MED ORDER — ONDANSETRON HCL (PF) 4 MG/2 ML IJ SOLN
INTRAVENOUS | 0 refills | Status: DC
Start: 2022-12-07 — End: 2022-12-07

## 2022-12-07 MED ORDER — PROPOFOL INJ 10 MG/ML IV VIAL
INTRAVENOUS | 0 refills | Status: DC
Start: 2022-12-07 — End: 2022-12-07

## 2022-12-07 MED ORDER — PHENYLEPHRINE 40 MCG/ML IN NS IV DRIP (STD CONC)
INTRAVENOUS | 0 refills | Status: DC
Start: 2022-12-07 — End: 2022-12-07
  Administered 2022-12-07 (×2): .3 ug/kg/min via INTRAVENOUS

## 2022-12-07 MED ORDER — LACTATED RINGERS IV SOLP
INTRAVENOUS | 0 refills | Status: DC
Start: 2022-12-07 — End: 2022-12-07

## 2022-12-07 MED ORDER — CEFAZOLIN 2 GRAM IV SOLR
INTRAVENOUS | 0 refills | Status: DC
Start: 2022-12-07 — End: 2022-12-07

## 2022-12-07 MED ORDER — ROCURONIUM 10 MG/ML IV SOLN
INTRAVENOUS | 0 refills | Status: DC
Start: 2022-12-07 — End: 2022-12-07

## 2022-12-07 MED ORDER — PHENYLEPHRINE HCL IN 0.9% NACL 1 MG/10 ML (100 MCG/ML) IV SYRG
INTRAVENOUS | 0 refills | Status: DC
Start: 2022-12-07 — End: 2022-12-07

## 2022-12-07 MED ORDER — FENTANYL CITRATE (PF) 50 MCG/ML IJ SOLN
INTRAVENOUS | 0 refills | Status: DC
Start: 2022-12-07 — End: 2022-12-07

## 2022-12-07 MED ORDER — HYDROMORPHONE (PF) 2 MG/ML IJ SYRG
INTRAVENOUS | 0 refills | Status: DC
Start: 2022-12-07 — End: 2022-12-07

## 2022-12-07 MED ORDER — ARTIFICIAL TEARS (PF) SINGLE DOSE DROPS GROUP
OPHTHALMIC | 0 refills | Status: DC
Start: 2022-12-07 — End: 2022-12-07

## 2022-12-07 MED ORDER — LIDOCAINE (PF) 200 MG/10 ML (2 %) IJ SYRG
INTRAVENOUS | 0 refills | Status: DC
Start: 2022-12-07 — End: 2022-12-07

## 2022-12-07 MED ORDER — SUGAMMADEX 100 MG/ML IV SOLN
INTRAVENOUS | 0 refills | Status: DC
Start: 2022-12-07 — End: 2022-12-07

## 2022-12-07 MED ADMIN — FENTANYL CITRATE (PF) 50 MCG/ML IJ SOLN [3037]: 25 ug | INTRAVENOUS | @ 19:00:00 | Stop: 2022-12-07 | NDC 00409909412

## 2022-12-07 MED ADMIN — SODIUM CHLORIDE 0.9 % IV SOLP [27838]: 1000 mL | INTRAVENOUS | @ 16:00:00 | Stop: 2022-12-07 | NDC 00338004904

## 2022-12-07 MED ADMIN — ACETAMINOPHEN 500 MG PO TAB [102]: 1000 mg | ORAL | @ 19:00:00 | Stop: 2022-12-07 | NDC 00904673061

## 2022-12-07 MED ADMIN — IPRATROPIUM-ALBUTEROL 0.5 MG-3 MG(2.5 MG BASE)/3 ML IN NEBU [77459]: 3 mL | RESPIRATORY_TRACT | @ 19:00:00 | Stop: 2022-12-07 | NDC 00487020101

## 2022-12-07 MED FILL — ONDANSETRON HCL 4 MG PO TAB: 4 mg | ORAL | 6 days supply | Qty: 18 | Fill #1 | Status: CP

## 2022-12-07 MED FILL — TRAMADOL 50 MG PO TAB: 50 mg | ORAL | 10 days supply | Qty: 30 | Fill #1 | Status: CP

## 2022-12-09 ENCOUNTER — Encounter: Admit: 2022-12-09 | Discharge: 2022-12-09 | Payer: MEDICARE

## 2022-12-09 DIAGNOSIS — I2089 Angina of effort (HCC): Secondary | ICD-10-CM

## 2022-12-09 DIAGNOSIS — U071 COVID-19: Secondary | ICD-10-CM

## 2022-12-09 DIAGNOSIS — I495 Sick sinus syndrome: Secondary | ICD-10-CM

## 2022-12-09 DIAGNOSIS — R011 Cardiac murmur, unspecified: Secondary | ICD-10-CM

## 2022-12-09 DIAGNOSIS — I10 Essential (primary) hypertension: Secondary | ICD-10-CM

## 2022-12-09 DIAGNOSIS — J45909 Unspecified asthma, uncomplicated: Secondary | ICD-10-CM

## 2022-12-09 DIAGNOSIS — T82110A Breakdown (mechanical) of cardiac electrode, initial encounter: Secondary | ICD-10-CM

## 2022-12-09 DIAGNOSIS — E7800 Pure hypercholesterolemia, unspecified: Secondary | ICD-10-CM

## 2022-12-09 DIAGNOSIS — I2510 Atherosclerotic heart disease of native coronary artery without angina pectoris: Secondary | ICD-10-CM

## 2022-12-09 DIAGNOSIS — I7789 Other specified disorders of arteries and arterioles: Secondary | ICD-10-CM

## 2022-12-09 DIAGNOSIS — H9190 Unspecified hearing loss, unspecified ear: Secondary | ICD-10-CM

## 2022-12-09 DIAGNOSIS — E785 Hyperlipidemia, unspecified: Secondary | ICD-10-CM

## 2022-12-09 DIAGNOSIS — R9439 Abnormal result of other cardiovascular function study: Secondary | ICD-10-CM

## 2022-12-09 DIAGNOSIS — I480 Paroxysmal atrial fibrillation: Secondary | ICD-10-CM

## 2022-12-09 DIAGNOSIS — T50905A Adverse effect of unspecified drugs, medicaments and biological substances, initial encounter: Secondary | ICD-10-CM

## 2022-12-09 DIAGNOSIS — E119 Type 2 diabetes mellitus without complications: Secondary | ICD-10-CM

## 2022-12-09 DIAGNOSIS — I6529 Occlusion and stenosis of unspecified carotid artery: Secondary | ICD-10-CM

## 2022-12-09 DIAGNOSIS — K219 Gastro-esophageal reflux disease without esophagitis: Secondary | ICD-10-CM

## 2022-12-15 ENCOUNTER — Encounter: Admit: 2022-12-15 | Discharge: 2022-12-15 | Payer: MEDICARE

## 2022-12-24 ENCOUNTER — Encounter: Admit: 2022-12-24 | Discharge: 2022-12-24 | Payer: MEDICARE

## 2022-12-24 ENCOUNTER — Ambulatory Visit: Admit: 2022-12-24 | Discharge: 2022-12-25 | Payer: MEDICARE

## 2022-12-24 DIAGNOSIS — T50905A Adverse effect of unspecified drugs, medicaments and biological substances, initial encounter: Secondary | ICD-10-CM

## 2022-12-24 DIAGNOSIS — E119 Type 2 diabetes mellitus without complications: Secondary | ICD-10-CM

## 2022-12-24 DIAGNOSIS — R9439 Abnormal result of other cardiovascular function study: Secondary | ICD-10-CM

## 2022-12-24 DIAGNOSIS — E785 Hyperlipidemia, unspecified: Secondary | ICD-10-CM

## 2022-12-24 DIAGNOSIS — I480 Paroxysmal atrial fibrillation: Secondary | ICD-10-CM

## 2022-12-24 DIAGNOSIS — T82110A Breakdown (mechanical) of cardiac electrode, initial encounter: Secondary | ICD-10-CM

## 2022-12-24 DIAGNOSIS — I10 Essential (primary) hypertension: Secondary | ICD-10-CM

## 2022-12-24 DIAGNOSIS — H9190 Unspecified hearing loss, unspecified ear: Secondary | ICD-10-CM

## 2022-12-24 DIAGNOSIS — I495 Sick sinus syndrome: Secondary | ICD-10-CM

## 2022-12-24 DIAGNOSIS — E7800 Pure hypercholesterolemia, unspecified: Secondary | ICD-10-CM

## 2022-12-24 DIAGNOSIS — S82892D Other fracture of left lower leg, subsequent encounter for closed fracture with routine healing: Secondary | ICD-10-CM

## 2022-12-24 DIAGNOSIS — K219 Gastro-esophageal reflux disease without esophagitis: Secondary | ICD-10-CM

## 2022-12-24 DIAGNOSIS — I7789 Other specified disorders of arteries and arterioles: Secondary | ICD-10-CM

## 2022-12-24 DIAGNOSIS — I2089 Angina of effort (HCC): Secondary | ICD-10-CM

## 2022-12-24 DIAGNOSIS — J45909 Unspecified asthma, uncomplicated: Secondary | ICD-10-CM

## 2022-12-24 DIAGNOSIS — I6529 Occlusion and stenosis of unspecified carotid artery: Secondary | ICD-10-CM

## 2022-12-24 DIAGNOSIS — R011 Cardiac murmur, unspecified: Secondary | ICD-10-CM

## 2022-12-24 DIAGNOSIS — U071 COVID-19: Secondary | ICD-10-CM

## 2022-12-24 DIAGNOSIS — I2510 Atherosclerotic heart disease of native coronary artery without angina pectoris: Secondary | ICD-10-CM

## 2023-01-06 ENCOUNTER — Encounter: Admit: 2023-01-06 | Discharge: 2023-01-06 | Payer: MEDICARE

## 2023-01-06 DIAGNOSIS — S82892D Other fracture of left lower leg, subsequent encounter for closed fracture with routine healing: Secondary | ICD-10-CM

## 2023-01-12 ENCOUNTER — Encounter: Admit: 2023-01-12 | Discharge: 2023-01-12 | Payer: MEDICARE

## 2023-01-21 ENCOUNTER — Encounter: Admit: 2023-01-21 | Discharge: 2023-01-21 | Payer: MEDICARE

## 2023-01-21 ENCOUNTER — Ambulatory Visit: Admit: 2023-01-21 | Discharge: 2023-01-21 | Payer: MEDICARE

## 2023-01-21 DIAGNOSIS — S82892D Other fracture of left lower leg, subsequent encounter for closed fracture with routine healing: Secondary | ICD-10-CM

## 2023-01-21 NOTE — Progress Notes
S/p ORIF left ankle 12/07/2022      Doing ok.  Feeling improved. No new acute concerns or issues. Has been compliant with post op instructions. Denies any wound issues. No fevers, chills or sweats. denies numbness, tingling or paresthesias.     PE:  There were no vitals filed for this visit.      LLE: incisions well healed, no erythema or drainage, minimal ttp, appropriately swollen, ROM limited 2/2 pain and stiffness. NVI distally    XR: stable hardware and alignment, interval healing    A/p: s/p ORIF left ankle 12/07/2022    -Progressive weightbearing as tolerated on the left lower extremity in the CAM boot  -Ok to transition out of the CAM boot into a normal shoe as tolerated once patient is fully weightbearing   -PT script provided  -Pain control  -Return to clinic in 8 weeks with XR left ankle

## 2023-02-22 ENCOUNTER — Encounter: Admit: 2023-02-22 | Discharge: 2023-02-22 | Payer: MEDICARE

## 2023-03-10 ENCOUNTER — Encounter: Admit: 2023-03-10 | Discharge: 2023-03-10 | Payer: MEDICARE

## 2023-03-15 ENCOUNTER — Ambulatory Visit: Admit: 2023-03-15 | Discharge: 2023-03-16 | Payer: MEDICARE

## 2023-03-15 ENCOUNTER — Encounter: Admit: 2023-03-15 | Discharge: 2023-03-15 | Payer: MEDICARE

## 2023-03-15 DIAGNOSIS — H9190 Unspecified hearing loss, unspecified ear: Secondary | ICD-10-CM

## 2023-03-15 DIAGNOSIS — Z9889 Other specified postprocedural states: Secondary | ICD-10-CM

## 2023-03-15 DIAGNOSIS — T82110A Breakdown (mechanical) of cardiac electrode, initial encounter: Secondary | ICD-10-CM

## 2023-03-15 DIAGNOSIS — I2089 Angina of effort (HCC): Secondary | ICD-10-CM

## 2023-03-15 DIAGNOSIS — N289 Disorder of kidney and ureter, unspecified: Secondary | ICD-10-CM

## 2023-03-15 DIAGNOSIS — E785 Hyperlipidemia, unspecified: Secondary | ICD-10-CM

## 2023-03-15 DIAGNOSIS — I2510 Atherosclerotic heart disease of native coronary artery without angina pectoris: Secondary | ICD-10-CM

## 2023-03-15 DIAGNOSIS — I6529 Occlusion and stenosis of unspecified carotid artery: Secondary | ICD-10-CM

## 2023-03-15 DIAGNOSIS — I10 Essential (primary) hypertension: Secondary | ICD-10-CM

## 2023-03-15 DIAGNOSIS — U071 COVID-19: Secondary | ICD-10-CM

## 2023-03-15 DIAGNOSIS — J45909 Unspecified asthma, uncomplicated: Secondary | ICD-10-CM

## 2023-03-15 DIAGNOSIS — R9439 Abnormal result of other cardiovascular function study: Secondary | ICD-10-CM

## 2023-03-15 DIAGNOSIS — I7789 Other specified disorders of arteries and arterioles: Secondary | ICD-10-CM

## 2023-03-15 DIAGNOSIS — I495 Sick sinus syndrome: Secondary | ICD-10-CM

## 2023-03-15 DIAGNOSIS — Z9189 Other specified personal risk factors, not elsewhere classified: Secondary | ICD-10-CM

## 2023-03-15 DIAGNOSIS — K219 Gastro-esophageal reflux disease without esophagitis: Secondary | ICD-10-CM

## 2023-03-15 DIAGNOSIS — Z950 Presence of cardiac pacemaker: Secondary | ICD-10-CM

## 2023-03-15 DIAGNOSIS — I480 Paroxysmal atrial fibrillation: Secondary | ICD-10-CM

## 2023-03-15 DIAGNOSIS — E119 Type 2 diabetes mellitus without complications: Secondary | ICD-10-CM

## 2023-03-15 DIAGNOSIS — E7800 Pure hypercholesterolemia, unspecified: Secondary | ICD-10-CM

## 2023-03-15 DIAGNOSIS — R011 Cardiac murmur, unspecified: Secondary | ICD-10-CM

## 2023-03-15 DIAGNOSIS — I6522 Occlusion and stenosis of left carotid artery: Secondary | ICD-10-CM

## 2023-03-15 DIAGNOSIS — T50905A Adverse effect of unspecified drugs, medicaments and biological substances, initial encounter: Secondary | ICD-10-CM

## 2023-03-15 MED ORDER — NITROGLYCERIN 0.4 MG SL SUBL
ORAL_TABLET | SUBLINGUAL | 0 refills | 9.00000 days | Status: AC
Start: 2023-03-15 — End: ?

## 2023-03-15 NOTE — Patient Instructions
Thank you for visiting our office today.   We recommend follow-up with our office in 6 months.   Please call [phone] if you're unable to schedule the office visit Dr. Mehta ordered.     Take your medications as ordered.   Check your list with what you have on hand at home.   Should you have any additional questions or concerns, please message me through MyChart or call the office.    Cardiovascular Medicine Team   Clinic phone: [phone]

## 2023-03-16 DIAGNOSIS — R0789 Other chest pain: Secondary | ICD-10-CM

## 2023-03-18 ENCOUNTER — Ambulatory Visit: Admit: 2023-03-18 | Discharge: 2023-03-18 | Payer: MEDICARE

## 2023-03-18 ENCOUNTER — Encounter: Admit: 2023-03-18 | Discharge: 2023-03-18 | Payer: MEDICARE

## 2023-03-18 DIAGNOSIS — I6529 Occlusion and stenosis of unspecified carotid artery: Secondary | ICD-10-CM

## 2023-03-18 DIAGNOSIS — S82892D Other fracture of left lower leg, subsequent encounter for closed fracture with routine healing: Secondary | ICD-10-CM

## 2023-03-18 DIAGNOSIS — E785 Hyperlipidemia, unspecified: Secondary | ICD-10-CM

## 2023-03-18 DIAGNOSIS — R011 Cardiac murmur, unspecified: Secondary | ICD-10-CM

## 2023-03-18 DIAGNOSIS — T82110A Breakdown (mechanical) of cardiac electrode, initial encounter: Secondary | ICD-10-CM

## 2023-03-18 DIAGNOSIS — T50905A Adverse effect of unspecified drugs, medicaments and biological substances, initial encounter: Secondary | ICD-10-CM

## 2023-03-18 DIAGNOSIS — E119 Type 2 diabetes mellitus without complications: Secondary | ICD-10-CM

## 2023-03-18 DIAGNOSIS — H9190 Unspecified hearing loss, unspecified ear: Secondary | ICD-10-CM

## 2023-03-18 DIAGNOSIS — U071 COVID-19: Secondary | ICD-10-CM

## 2023-03-18 DIAGNOSIS — J45909 Unspecified asthma, uncomplicated: Secondary | ICD-10-CM

## 2023-03-18 DIAGNOSIS — I7789 Other specified disorders of arteries and arterioles: Secondary | ICD-10-CM

## 2023-03-18 DIAGNOSIS — I495 Sick sinus syndrome: Secondary | ICD-10-CM

## 2023-03-18 DIAGNOSIS — E7800 Pure hypercholesterolemia, unspecified: Secondary | ICD-10-CM

## 2023-03-18 DIAGNOSIS — I2089 Angina of effort (HCC): Secondary | ICD-10-CM

## 2023-03-18 DIAGNOSIS — I480 Paroxysmal atrial fibrillation: Secondary | ICD-10-CM

## 2023-03-18 DIAGNOSIS — I10 Essential (primary) hypertension: Secondary | ICD-10-CM

## 2023-03-18 DIAGNOSIS — N289 Disorder of kidney and ureter, unspecified: Secondary | ICD-10-CM

## 2023-03-18 DIAGNOSIS — I2510 Atherosclerotic heart disease of native coronary artery without angina pectoris: Secondary | ICD-10-CM

## 2023-03-18 DIAGNOSIS — K219 Gastro-esophageal reflux disease without esophagitis: Secondary | ICD-10-CM

## 2023-03-18 DIAGNOSIS — R9439 Abnormal result of other cardiovascular function study: Secondary | ICD-10-CM

## 2023-03-18 NOTE — Progress Notes
8-2 Medicare is listed as patient's primary insurance coverage.    Pre-certification is not required for hospitalizations.

## 2023-03-22 ENCOUNTER — Encounter: Admit: 2023-03-22 | Discharge: 2023-03-22 | Payer: MEDICARE

## 2023-03-24 ENCOUNTER — Encounter: Admit: 2023-03-24 | Discharge: 2023-03-24 | Payer: MEDICARE

## 2023-03-24 ENCOUNTER — Ambulatory Visit: Admit: 2023-03-24 | Discharge: 2023-03-25 | Payer: MEDICARE

## 2023-03-24 DIAGNOSIS — K219 Gastro-esophageal reflux disease without esophagitis: Secondary | ICD-10-CM

## 2023-03-24 DIAGNOSIS — U071 COVID-19: Secondary | ICD-10-CM

## 2023-03-24 DIAGNOSIS — J45909 Unspecified asthma, uncomplicated: Secondary | ICD-10-CM

## 2023-03-24 DIAGNOSIS — R9439 Abnormal result of other cardiovascular function study: Secondary | ICD-10-CM

## 2023-03-24 DIAGNOSIS — T50905A Adverse effect of unspecified drugs, medicaments and biological substances, initial encounter: Secondary | ICD-10-CM

## 2023-03-24 DIAGNOSIS — H9190 Unspecified hearing loss, unspecified ear: Secondary | ICD-10-CM

## 2023-03-24 DIAGNOSIS — I10 Essential (primary) hypertension: Secondary | ICD-10-CM

## 2023-03-24 DIAGNOSIS — I480 Paroxysmal atrial fibrillation: Secondary | ICD-10-CM

## 2023-03-24 DIAGNOSIS — I2510 Atherosclerotic heart disease of native coronary artery without angina pectoris: Secondary | ICD-10-CM

## 2023-03-24 DIAGNOSIS — E119 Type 2 diabetes mellitus without complications: Secondary | ICD-10-CM

## 2023-03-24 DIAGNOSIS — R011 Cardiac murmur, unspecified: Secondary | ICD-10-CM

## 2023-03-24 DIAGNOSIS — N289 Disorder of kidney and ureter, unspecified: Secondary | ICD-10-CM

## 2023-03-24 DIAGNOSIS — E785 Hyperlipidemia, unspecified: Secondary | ICD-10-CM

## 2023-03-24 DIAGNOSIS — I495 Sick sinus syndrome: Secondary | ICD-10-CM

## 2023-03-24 DIAGNOSIS — I25118 Atherosclerotic heart disease of native coronary artery with other forms of angina pectoris: Secondary | ICD-10-CM

## 2023-03-24 DIAGNOSIS — I7789 Other specified disorders of arteries and arterioles: Secondary | ICD-10-CM

## 2023-03-24 DIAGNOSIS — T82110A Breakdown (mechanical) of cardiac electrode, initial encounter: Secondary | ICD-10-CM

## 2023-03-24 DIAGNOSIS — E7800 Pure hypercholesterolemia, unspecified: Secondary | ICD-10-CM

## 2023-03-24 DIAGNOSIS — Z9189 Other specified personal risk factors, not elsewhere classified: Secondary | ICD-10-CM

## 2023-03-24 DIAGNOSIS — I2089 Angina of effort (HCC): Secondary | ICD-10-CM

## 2023-03-24 DIAGNOSIS — I6529 Occlusion and stenosis of unspecified carotid artery: Secondary | ICD-10-CM

## 2023-03-29 ENCOUNTER — Encounter: Admit: 2023-03-29 | Discharge: 2023-03-29 | Payer: MEDICARE

## 2023-03-29 DIAGNOSIS — I480 Paroxysmal atrial fibrillation: Secondary | ICD-10-CM

## 2023-03-30 ENCOUNTER — Encounter: Admit: 2023-03-30 | Discharge: 2023-03-30 | Payer: MEDICARE

## 2023-04-01 ENCOUNTER — Encounter: Admit: 2023-04-01 | Discharge: 2023-04-01 | Payer: MEDICARE

## 2023-04-01 ENCOUNTER — Ambulatory Visit: Admit: 2023-04-01 | Discharge: 2023-04-02 | Payer: MEDICARE

## 2023-04-01 DIAGNOSIS — I2089 Angina of effort (HCC): Secondary | ICD-10-CM

## 2023-04-01 DIAGNOSIS — T82110A Breakdown (mechanical) of cardiac electrode, initial encounter: Secondary | ICD-10-CM

## 2023-04-01 DIAGNOSIS — K219 Gastro-esophageal reflux disease without esophagitis: Secondary | ICD-10-CM

## 2023-04-01 DIAGNOSIS — R9439 Abnormal result of other cardiovascular function study: Secondary | ICD-10-CM

## 2023-04-01 DIAGNOSIS — I6529 Occlusion and stenosis of unspecified carotid artery: Secondary | ICD-10-CM

## 2023-04-01 DIAGNOSIS — J45909 Unspecified asthma, uncomplicated: Secondary | ICD-10-CM

## 2023-04-01 DIAGNOSIS — I10 Essential (primary) hypertension: Secondary | ICD-10-CM

## 2023-04-01 DIAGNOSIS — N289 Disorder of kidney and ureter, unspecified: Secondary | ICD-10-CM

## 2023-04-01 DIAGNOSIS — E7800 Pure hypercholesterolemia, unspecified: Secondary | ICD-10-CM

## 2023-04-01 DIAGNOSIS — E785 Hyperlipidemia, unspecified: Secondary | ICD-10-CM

## 2023-04-01 DIAGNOSIS — H9190 Unspecified hearing loss, unspecified ear: Secondary | ICD-10-CM

## 2023-04-01 DIAGNOSIS — I2510 Atherosclerotic heart disease of native coronary artery without angina pectoris: Secondary | ICD-10-CM

## 2023-04-01 DIAGNOSIS — Z9189 Other specified personal risk factors, not elsewhere classified: Secondary | ICD-10-CM

## 2023-04-01 DIAGNOSIS — R011 Cardiac murmur, unspecified: Secondary | ICD-10-CM

## 2023-04-01 DIAGNOSIS — I7789 Other specified disorders of arteries and arterioles: Secondary | ICD-10-CM

## 2023-04-01 DIAGNOSIS — T50905A Adverse effect of unspecified drugs, medicaments and biological substances, initial encounter: Secondary | ICD-10-CM

## 2023-04-01 DIAGNOSIS — E119 Type 2 diabetes mellitus without complications: Secondary | ICD-10-CM

## 2023-04-01 DIAGNOSIS — I495 Sick sinus syndrome: Secondary | ICD-10-CM

## 2023-04-01 DIAGNOSIS — I480 Paroxysmal atrial fibrillation: Secondary | ICD-10-CM

## 2023-04-01 DIAGNOSIS — W19XXXD Unspecified fall, subsequent encounter: Secondary | ICD-10-CM

## 2023-04-01 DIAGNOSIS — U071 COVID-19: Secondary | ICD-10-CM

## 2023-04-01 NOTE — Progress Notes
Date of Service: 04/01/2023    Marc Nelson is a 77 y.o. male.       HPI     Marc Nelson is here today for a shared decision making visit prior to watchman placement.     He has a history of atrial fibrillation that was diagnosed in 2013 following bypass surgery.  He is currently on medical management with metoprolol and anticoagulation with apixaban 5 mg twice daily.    He has a history of balance issues resulting in recurrent falls with bleeding.  He reports his falls are mostly due to balance issues and tripping and falling.  He currently uses a cane for ambulation.  Despite these issues he continues to stay active.  He reports occasional symptoms of exertional shortness of breath.  No symptoms of palpitations.  He denies prior history of TIA or stroke.    Due to the recurrent falls and risk for bleeding he has established care with Dr.Gunasekaran for evaluation of left atrial appendage closure device.        Vitals:    04/01/23 0753   BP: 133/75   BP Source: Arm, Right Upper   Pulse: 73   PainSc: Zero   Weight: 79.4 kg (175 lb)   Height: 177.8 cm (5' 10)     Body mass index is 25.11 kg/m?Marland Kitchen     Past Medical History  Patient Active Problem List    Diagnosis Date Noted    Type III open fracture of distal end of left tibia 11/28/2022    Pain, acute due to trauma 11/28/2022    Impaired mobility and ADLs 11/28/2022    Trauma 24/40/1027    Pacemaker lead malfunction, initial encounter 12/15/2021    Asymptomatic stenosis of left carotid artery 11/27/2021    Pacemaker lead fracture 11/10/2021    Left-sided carotid artery disease (HCC) 11/10/2021    Hyperlipemia 06/30/2017    Sinus node dysfunction (HCC) 05/25/2016     Pacemaker in place.       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 There were no episodes of any sustained ventricular or supraventricular arrhythmias noted.   06/21/2012 Echo: EF 60 %, normal LV systolic function. Mild mitral and tricuspid regurgitations. PA pressures were 33 mmHg. LA size 5.4 cm.   07/23/2013 Stress thallium: The patient exercised for 11 minute and 54 seconds. During peak exercise, there was 1 mm horizontal ST depression in II, III, aVF, and V5 and V6. However, SPECT images did not show any significant ischemia. Ejection fraction was noted to be 61 percent.   12/27/13 Holter: The predominating rhythm was NS. Rare Supraventricular Ectopic Beats were observed including Atrial Couplets and a Triplet. Rare Ventricular Ectopic Beats were recognized including Rare Couplets. Patient complained of SOB and weakness. No correlation of his symptoms were noted to any underlying arrhythmia.   06/21/2014 Exercise stress thallium: noted to be  normal.   05/06/15 Echo: The LV EF is about 60%. The LA is mildly enlarged. Mild MR & mild TR noted. The PA systolic pressure is normal. LA size 4.5 cm.   10/20/2015 Regadenoson stress [facility]): no evidence of any significant ischemia and normal ejection fraction at 69%.  10/24/2015 Echo: normal left ventricular function.  Ejection fraction 65%.  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 LA size 4.3 cm.      11/24/2015 30-day looping event monitor: multiple episodes of atrial fibrillation and flutter with ventricular rates ranging between 80-110 beats per minute.  One episode of atrial fibrillation was associated with complaints of heart racing  01/06/16 s/p Medtronic LINQ cardiac memory loop recorder at the anterior chest wall (Dr. Bernette Mayers)       Thrombocytopenia (HCC) 08/26/2011    AKI (acute kidney injury) (HCC) 08/26/2011    CAD (coronary artery disease) 08/23/2011    Chest pain 07/30/2011    SOB (shortness of breath) 07/15/2011    Dyslipidemia 10/22/2009    DM II (diabetes mellitus, type II), controlled (HCC) 10/22/2009    HTN, goal below 140/90 01/02/2009         ROS    Physical Exam  Deferred- televisit     Cardiovascular Studies      Cardiovascular Health Factors  Vitals BP Readings from Last 3 Encounters:   04/01/23 133/75   03/15/23 132/80   12/07/22 125/65     Wt Readings from Last 3 Encounters:   04/01/23 79.4 kg (175 lb)   03/18/23 80.3 kg (177 lb)   03/15/23 80.6 kg (177 lb 11.2 oz)     BMI Readings from Last 3 Encounters:   04/01/23 25.11 kg/m?   03/18/23 25.40 kg/m?   03/15/23 25.50 kg/m?      Smoking Social History     Tobacco Use   Smoking Status Never   Smokeless Tobacco Never      Lipid Profile Cholesterol   Date Value Ref Range Status   01/18/2023 79  Final     HDL   Date Value Ref Range Status   01/18/2023 35 (L)  Final     LDL   Date Value Ref Range Status   01/18/2023 35  Final     Triglycerides   Date Value Ref Range Status   01/18/2023 46  Final      Blood Sugar Hemoglobin A1C   Date Value Ref Range Status   01/18/2023 7.2 (H)  Final     Glucose   Date Value Ref Range Status   01/18/2023 176 (H)  Final   11/29/2022 126 (H) 70 - 100 MG/DL Final   56/21/3086 578 (H) 70 - 100 MG/DL Final     Glucose, POC   Date Value Ref Range Status   12/07/2022 120 (H) 70 - 100 MG/DL Final   46/96/2952 841 (H) 70 - 100 MG/DL Final   32/44/0102 725 (H) 70 - 100 MG/DL Final          Problems Addressed Today  No diagnosis found.    Assessment and Plan     Marc Nelson is here today for a shared decision making visit prior to watchman placement. He requires long term anticoagulation for stroke prevention in the setting of atrial fibrillation however he has risk factors for increased bleeding due to recurrent falls with prior  history of trauma associated bleeding. He is an appropriate candidate for watchman placement. Procedure is scheduled for watchman placement on 04/06/23 with Dr.Gunasekaran .     Shared Decision-Making interaction regarding the use of anticoagulation in patient with non-valvular Atrial Fibrillation    NYHA Class:   II (slight limitation of physical activity, comfortable at rest, but ordinary physical activity results in symptoms of HF e.g., walking or climbing stairs)    CHA2DS2-VASc Score: HTN (1), DM (1), >_ 75 (2), and Prior MI, PAD, or Aortic Plaque (1)  CHA2DSVASc Yearly Stroke Risk: 5=6.7%    HAS-BLED score: HTN (1), Elderly >65 (1), and Current Antiplatelet or NSAID use (1)  HAS-BLED Yearly Risk: 3=3.74%    LAAO Indication: History of Bleeding or increased risk of bleeding and High risk of recurrent falls    Based on their past-history it has been determined that they are poor candidates for long-term oral-anticoagulation, however, may be tolerant of short-term treatment with warfarin or other direct acting oral anticoagulants as necessary.     We have discussed their unique stroke and bleeding risk both on and off oral-anticoagulation, and the rationale for this referral for transcatheter left atrial appendage closure. An evidence-based tool (EBT) was provided to patient to ensure that the patients' health goals and preferences were covered.    A copy of the EBT can be accessed thru [facility] system under 'Left Atrial Appendage Occlusion (LAAO) Device Shared Decision-Making Tool.'                 Current Medications (including today's revisions)   acetaminophen (TYLENOL) 325 mg tablet Take two tablets by mouth every 6 hours as needed. Indications: pain    acetaminophen SR (TYLENOL ARTHRITIS PAIN) 650 mg tablet Take one tablet by mouth every 8 hours as needed.    apixaban (ELIQUIS) 5 mg tablet Take one tablet by mouth twice daily.    aspirin EC 81 mg tablet Take one tablet by mouth daily. Take with food.    atorvastatin (LIPITOR) 80 mg tablet Take one tablet by mouth at bedtime daily.    cetirizine (ZYRTEC) 10 mg tablet Take one tablet by mouth daily as needed.    cholecalciferol (Vitamin D3) (VITAMIN D-3) 1,000 units tablet Take one tablet by mouth daily.      citalopram (CELEXA) 40 mg tablet Take one-half tablet by mouth daily.    cyanocobalamin (vitamin B-12) 1,000 mcg tablet Take one tablet by mouth daily.    dorzolamide (TRUSOPT) 2 % ophthalmic solution Apply one drop to both eyes twice daily.    ezetimibe (ZETIA) 10 mg tablet TAKE ONE TABLET BY MOUTH DAILY    finasteride (PROSCAR) 5 mg tablet Take one tablet by mouth daily.    insulin glargine-yfgn (SEMGLEE PEN) 100 unit/mL (3 mL) injectable PEN Inject forty Units under the skin daily.    JARDIANCE 25 mg tablet Take one tablet by mouth daily.    metoprolol tartrate (LOPRESSOR) 50 mg tablet Take one-half tablet by mouth twice daily.    nitroglycerin (NITROSTAT) 0.4 mg tablet DISSOLVE 1 TAB UNDER TONGUE FOR CHEST PAIN - IF PAIN REMAINS AFTER 5 MIN, CALL 911 AND REPEAT DOSE. MAX 3 TABS IN 15 MINUTES    NOVOLOG FLEXPEN 100 unit/mL injection PEN Inject 8 units with breakfast, 8-12 units with lunch, and 12-15 units with supper  Indications: 3 units at  lunch, 18 units with dinner    pantoprazole DR (PROTONIX) 20 mg tablet Take one tablet  by mouth daily.    travoprost (TRAVATAN Z) 0.004 % ophthalmic solution Apply one drop to both eyes twice daily.    ubidecarenone (COENZYME Q10 PO) Take 1 capsule by mouth daily.

## 2023-04-01 NOTE — Patient Instructions
If you have questions regarding your visit today or upcoming Watchman procedure please reach out to Stephanie Stokka at [phone].

## 2023-04-02 DIAGNOSIS — I480 Paroxysmal atrial fibrillation: Secondary | ICD-10-CM

## 2023-04-05 ENCOUNTER — Encounter: Admit: 2023-04-05 | Discharge: 2023-04-05 | Payer: MEDICARE

## 2023-04-06 ENCOUNTER — Inpatient Hospital Stay: Admit: 2023-04-06 | Discharge: 2023-04-06 | Payer: MEDICARE

## 2023-04-06 ENCOUNTER — Encounter: Admit: 2023-04-06 | Discharge: 2023-04-06 | Payer: MEDICARE

## 2023-04-06 ENCOUNTER — Ambulatory Visit: Admit: 2023-04-06 | Discharge: 2023-04-06 | Payer: MEDICARE

## 2023-04-06 DIAGNOSIS — I480 Paroxysmal atrial fibrillation: Secondary | ICD-10-CM

## 2023-04-06 MED ADMIN — CEFAZOLIN INJ 1GM IVP [210319]: 1 g | INTRAVENOUS | @ 23:00:00 | Stop: 2023-04-06 | NDC 60505614200

## 2023-04-06 MED ADMIN — ASPIRIN 325 MG PO TAB [681]: 325 mg | ORAL | @ 13:00:00 | Stop: 2023-04-06 | NDC 00536105429

## 2023-04-10 ENCOUNTER — Encounter: Admit: 2023-04-10 | Discharge: 2023-04-10 | Payer: MEDICARE

## 2023-04-10 ENCOUNTER — Emergency Department: Admit: 2023-04-10 | Discharge: 2023-04-10 | Payer: MEDICARE

## 2023-04-10 ENCOUNTER — Inpatient Hospital Stay: Admit: 2023-04-10 | Payer: MEDICARE

## 2023-04-10 ENCOUNTER — Inpatient Hospital Stay: Admit: 2023-04-10 | Discharge: 2023-04-10 | Payer: MEDICARE

## 2023-04-10 DIAGNOSIS — T8149XA Infection following a procedure, other surgical site, initial encounter: Secondary | ICD-10-CM

## 2023-04-10 LAB — COMPREHENSIVE METABOLIC PANEL
ALBUMIN: 3.8 g/dL — ABNORMAL LOW (ref 3.5–5.0)
ALK PHOSPHATASE: 121 U/L — ABNORMAL HIGH (ref 25–110)
ALT: 22 U/L (ref 7–56)
ANION GAP: 8 10*3/uL (ref 3–12)
AST: 23 U/L (ref 7–40)
BLD UREA NITROGEN: 27 mg/dL — ABNORMAL HIGH (ref 7–25)
CALCIUM: 9.8 mg/dL — ABNORMAL HIGH (ref 8.5–10.6)
CHLORIDE: 111 MMOL/L — ABNORMAL HIGH (ref 98–110)
CO2: 20 MMOL/L — ABNORMAL LOW (ref 21–30)
CREATININE: 1 mg/dL (ref 0.4–1.24)
EGFR: >60 mL/min (ref >60)
GLUCOSE,PANEL: 169 mg/dL — ABNORMAL HIGH (ref 70–100)
POTASSIUM: 4.5 MMOL/L — ABNORMAL LOW (ref 3.5–5.1)
SODIUM: 139 MMOL/L — ABNORMAL LOW (ref 137–147)
TOTAL BILIRUBIN: 0.5 mg/dL (ref 0.2–1.3)
TOTAL PROTEIN: 7 g/dL — ABNORMAL LOW (ref 6.0–8.0)

## 2023-04-10 LAB — CBC AND DIFF
ABSOLUTE BASO COUNT: 0 10*3/uL (ref 0–0.20)
ABSOLUTE EOS COUNT: 0.9 10*3/uL — ABNORMAL HIGH (ref 0–0.45)
ABSOLUTE MONO COUNT: 0.5 10*3/uL (ref 0–0.80)
MDW (MONOCYTE DISTRIBUTION WIDTH): 18 (ref <20.7)
WBC COUNT: 5.9 10*3/uL (ref 4.5–11.0)

## 2023-04-10 LAB — POC GLUCOSE: POC GLUCOSE: 245 mg/dL — ABNORMAL HIGH (ref 70–100)

## 2023-04-10 LAB — POC LACTATE: LACTIC ACID POC: 0.6 MMOL/L (ref 0.5–2.0)

## 2023-04-10 MED ORDER — FINASTERIDE 5 MG PO TAB
5 mg | Freq: Every day | ORAL | 0 refills | Status: AC
Start: 2023-04-10 — End: ?
  Administered 2023-04-11 – 2023-04-12 (×2): 5 mg via ORAL

## 2023-04-10 MED ORDER — VANCOMYCIN 1,250 MG IVPB (VIAL/BAG ADAPTER)
1250 mg | INTRAVENOUS | 0 refills | Status: AC
Start: 2023-04-10 — End: ?
  Administered 2023-04-10 – 2023-04-11 (×4): 1250 mg via INTRAVENOUS

## 2023-04-10 MED ORDER — INSULIN GLARGINE 100 UNIT/ML (3 ML) SC INJ PEN
20 [IU] | Freq: Every evening | SUBCUTANEOUS | 0 refills | Status: AC
Start: 2023-04-10 — End: ?
  Administered 2023-04-11: 03:00:00 20 [IU] via SUBCUTANEOUS

## 2023-04-10 MED ORDER — CHOLECALCIFEROL (VITAMIN D3) 25 MCG (1,000 UNIT) PO TAB
1000 [IU] | Freq: Every day | ORAL | 0 refills | Status: AC
Start: 2023-04-10 — End: ?
  Administered 2023-04-11 – 2023-04-12 (×2): 1000 [IU] via ORAL

## 2023-04-10 MED ORDER — IOHEXOL 350 MG IODINE/ML IV SOLN
80 mL | Freq: Once | INTRAVENOUS | 0 refills | Status: CP
Start: 2023-04-10 — End: ?
  Administered 2023-04-10: 23:00:00 80 mL via INTRAVENOUS

## 2023-04-10 MED ORDER — VANCOMYCIN PHARMACY TO MANAGE
1 | 0 refills | Status: AC
Start: 2023-04-10 — End: ?

## 2023-04-10 MED ORDER — ATORVASTATIN 40 MG PO TAB
80 mg | Freq: Every evening | ORAL | 0 refills | Status: AC
Start: 2023-04-10 — End: ?
  Administered 2023-04-11 – 2023-04-12 (×2): 80 mg via ORAL

## 2023-04-10 MED ORDER — EMPAGLIFLOZIN 25 MG PO TAB
25 mg | Freq: Every day | ORAL | 0 refills | Status: AC
Start: 2023-04-10 — End: ?
  Administered 2023-04-12: 14:00:00 25 mg via ORAL

## 2023-04-10 MED ORDER — INSULIN ASPART 100 UNIT/ML SC FLEXPEN
0-12 [IU] | Freq: Before meals | SUBCUTANEOUS | 0 refills | Status: AC
Start: 2023-04-10 — End: ?
  Administered 2023-04-11: 11 [IU] via SUBCUTANEOUS

## 2023-04-10 MED ORDER — CETIRIZINE 10 MG PO TAB
10 mg | Freq: Every day | ORAL | 0 refills | Status: AC | PRN
Start: 2023-04-10 — End: ?

## 2023-04-10 MED ORDER — SODIUM CHLORIDE 0.9 % IJ SOLN
50 mL | Freq: Once | INTRAVENOUS | 0 refills | Status: CP
Start: 2023-04-10 — End: ?
  Administered 2023-04-10: 23:00:00 50 mL via INTRAVENOUS

## 2023-04-10 MED ORDER — DEXTROSE 50 % IN WATER (D50W) IV SYRG
12.5-25 g | INTRAVENOUS | 0 refills | Status: AC | PRN
Start: 2023-04-10 — End: ?

## 2023-04-10 MED ORDER — CITALOPRAM 40 MG PO TAB
20 mg | Freq: Every day | ORAL | 0 refills | Status: AC
Start: 2023-04-10 — End: ?
  Administered 2023-04-11 – 2023-04-12 (×2): 20 mg via ORAL

## 2023-04-10 MED ORDER — ASPIRIN 81 MG PO TBEC
81 mg | Freq: Every day | ORAL | 0 refills | Status: AC
Start: 2023-04-10 — End: ?
  Administered 2023-04-11 – 2023-04-12 (×2): 81 mg via ORAL

## 2023-04-10 MED ORDER — INSULIN ASPART 100 UNIT/ML SC FLEXPEN
5 [IU] | Freq: Before meals | SUBCUTANEOUS | 0 refills | Status: AC
Start: 2023-04-10 — End: ?

## 2023-04-10 MED ORDER — METOPROLOL TARTRATE 25 MG PO TAB
25 mg | Freq: Two times a day (BID) | ORAL | 0 refills | Status: AC
Start: 2023-04-10 — End: ?
  Administered 2023-04-11 – 2023-04-12 (×4): 25 mg via ORAL

## 2023-04-10 MED ORDER — LOSARTAN 25 MG PO TAB
25 mg | Freq: Every day | ORAL | 0 refills | Status: AC
Start: 2023-04-10 — End: ?
  Administered 2023-04-11 – 2023-04-12 (×2): 25 mg via ORAL

## 2023-04-10 MED ORDER — EMPAGLIFLOZIN 25 MG PO TAB
25 mg | Freq: Every day | ORAL | 0 refills | Status: CN
Start: 2023-04-10 — End: ?

## 2023-04-10 MED ORDER — APIXABAN 5 MG PO TAB
5 mg | Freq: Two times a day (BID) | ORAL | 0 refills | Status: AC
Start: 2023-04-10 — End: ?
  Administered 2023-04-11 – 2023-04-12 (×4): 5 mg via ORAL

## 2023-04-10 MED ORDER — PANTOPRAZOLE 20 MG PO TBEC
20 mg | Freq: Every day | ORAL | 0 refills | Status: AC
Start: 2023-04-10 — End: ?
  Administered 2023-04-11 – 2023-04-12 (×2): 20 mg via ORAL

## 2023-04-10 MED ORDER — NITROGLYCERIN 0.4 MG SL SUBL
.4 mg | SUBLINGUAL | 0 refills | Status: AC | PRN
Start: 2023-04-10 — End: ?

## 2023-04-10 MED ORDER — EZETIMIBE 10 MG PO TAB
10 mg | Freq: Every day | ORAL | 0 refills | Status: AC
Start: 2023-04-10 — End: ?
  Administered 2023-04-11 – 2023-04-12 (×2): 10 mg via ORAL

## 2023-04-10 NOTE — Consults
This is an Designer, industrial/product note.   Patient will be admitted to CV service.

## 2023-04-10 NOTE — ED Notes
Pt presents to the ED with complaints of possible left femoral insertion site infection. Pt reports it has been draining since discharged from the hospital. Pt denies fevers/chills. A&Ox4. Skin warm, dry, intact. Resp even and unlabored. Pt attached to all monitors. Pt resting comfortably. Call light within reach. Pt verbalized understanding of use. Cart in lowest position, wheels are locked, and bed rails up x2.

## 2023-04-10 NOTE — Telephone Encounter
Patient's wife contacted the on-call line.  He had a recent Watchman procedure with femoral access.  Since his discharge he has noted persistent bleeding.  He denied any fever, chills, purulence or foul-smelling drainage.  He denied any loss of sensation or pain distally to his leg.  I advised him to proceed to the ER for further evaluation as he will likely require duplex ultrasound evaluation to ensure no post procedure vascular complications from his recent femoral access.

## 2023-04-10 NOTE — Consults
[facility] Consult     Patient: Marc Nelson, [id number]  Admission Date:  04/10/2023, LOS: 0 days  Admission Diagnosis: Post-operative wound abscess [T81.49XA]  Date of Service: April 10, 2023  CONSULT VASCULAR SURGERY PHYSICIAN  Consult performed by: Marc Royals, MD  Consult ordered by: Marc Leatherwood, MD           ASSESSMENT:   Marc Nelson is a 77 y.o. male with CAD s/p CABG (2021), HTN, HLD, DM and afib s/p Watchman placement (8/21) who had drainage since procedure now w/ concern for purulent drainage from L femoral access site     PLAN:  -No acute surgical intervention. No active drainage at time of evaluation, no surrounding erythema and no leukocytosis. Given reports of purulent drainage, agree with vancomycin and following cultures. Will follow up CT imaging results. At this time unclear if fluid collection is abscess vs hematoma vs seroma.     Discussed with staff surgeon, Marc Nelson.    Marc Royals, MD  Service Pager: [phone]  _____________________________________________________________________________    HPI: Marc Nelson is a 77 y.o. male w/ CAD s/p CABG (2021), HTN, HLD, DM and afib s/p Watchman placement (8/21) w/ concern for purulent drainage from L femoral access site. Patient reports that he has had drainage from that site since he had his procedure. However yesterday evening the drainage became more purulent in appearance and after speaking with the oncall cardiology team, he was instructed to come to the ED. [place] in the ED showed a 4x1.6x0.7cm fluid collection with no internal blood flow concerning for abscess vs hematoma vs seroma. Given the reports of purulent drainage the patient was admitted to cardiology, started on vanc, and cultures taken of drainage. Patient denies fevers, swelling, foul odor, redness, or pain.     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    HTN (hypertension)     Kidney disease     Other specified disorders of arteries and arterioles (HCC)     Pacemaker lead fracture     Paroxysmal A-fib (HCC) 09/03/2011    SSS (sick sinus syndrome) [facility])      Surgical History:   Procedure Laterality Date    HX CORONARY ARTERY BYPASS GRAFT  2013    @ [place], x 5 grafts    PACEMAKER INSERTION  10/2015    PPM at [place]--medtr[employer]    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/24/2016    Performed by Zollie Pee, MD at [facility] CATH LAB    Fluoroscopy N/A 03/24/2016    Performed by Zollie Pee, MD at [facility] CATH LAB    Removal Implantable Loop Recorder  03/24/2016    Performed by Zollie Pee, MD at [facility] CATH LAB  KNEE REPLACEMENT Bilateral 06/2020    10-2020    ENDARTERECTOMY CAROTID Left 12/02/2021    Performed by Dewitt Rota, DO at [facility] OR    PACEMAKER PLACEMENT  12/15/2021    replacement    REMOVAL PERMANENT PACEMAKER GENERATOR N/A 12/15/2021    Performed by Janalyn Rouse, MD at [facility] CVOR    REMOVAL PERMANENT PACEMAKER LEAD - DUAL LEAD SYSTEM N/A 12/15/2021    Performed by Janalyn Rouse, MD at [facility] CVOR    INSERTION/ REPLACEMENT PERMANENT PACEMAKER WITH ATRIAL AND VENTRICULAR LEAD N/A 12/15/2021    Performed by Janalyn Rouse, MD at [facility] CVOR    INSERTION/ REPLACEMENT TEMPORARY PACEMAKER LEAD/ CATHETER N/A 12/15/2021    Performed by Janalyn Rouse, MD at [facility] CVOR    TRANSESOPHAGEAL ECHOCARDIOGRAM DURING INTERVENTION N/A 12/15/2021    Performed by Janalyn Rouse, MD at [facility] CVOR    CARDIOTHORACIC SURGERY STANDBY N/A 12/15/2021    Performed by Janalyn Rouse, MD at [facility] CVOR    DEBRIDEMENT WOUND DEEP 20 SQ CM OR LESS - LOWER EXTREMITY Left 11/27/2022    Performed by Charlyne Quale, MD at [facility] OR    OPEN TREATMENT BIMALLEOLAR ANKLE FRACTURE WITH/ WITHOUT INTERNAL FIXATION Left 12/07/2022    Performed by Charlyne Quale, MD at [facility] OR    PERCUTANEOUS CLOSURE LEFT ATRIAL APPENDAGE WITH ENDOCARDIAL IMPLANT N/A 04/06/2023    Performed by Julienne Kass, MD at [facility] CATH LAB    INTRACARDIAC ECHOCARDIOGRAPHY N/A 04/06/2023    Performed by Julienne Kass, MD at [facility] CATH LAB    CARDIAC CATHERIZATION      CARDIOVASCULAR STRESS TEST      CARPAL TUNNEL RELEASE Bilateral     CATARACT REMOVAL WITH IMPLANT      COLONOSCOPY      DOPPLER ECHOCARDIOGRAPHY      ELECTROCARDIOGRAM      FINGER SURGERY      right hand 5th finger laceration repair    HX HEART CATHETERIZATION      HX HERNIA REPAIR Left     HX KNEE SURGERY      right    HX ROTATOR CUFF REPAIR Bilateral     LEFT HEART CATHETERIZATION      MYOCARDIAL PERFUSION IMG STUDY      US DUPLEX SCAN CAROTID BILATERAL       Family History   Problem Relation Name Age of Onset    Cancer Mother Johnny Bridge         colon cancer    Hypertension Mother Johnny Bridge     Heart murmur Mother Johnny Bridge     Cancer Father Jake Shark         prostate    Hypertension Father Jake Shark     Stroke Father Jake Shark     Cancer-Prostate Father Jake Shark     Heart murmur Brother Richard     Cancer-Prostate Brother Richard     Hypertension Brother Richard      Social History     Tobacco Use    Smoking status: Never    Smokeless tobacco: Never   Substance Use Topics    Alcohol use: No     Your Current Medications:         Instructions    acetaminophen (TYLENOL) 325 mg tablet Take two tablets by mouth every 6 hours as needed. Indications: pain    acetaminophen SR (TYLENOL ARTHRITIS PAIN) 650 mg tablet Take one tablet by mouth every 8 hours as needed.  apixaban (ELIQUIS) 5 mg tablet Take one tablet by mouth twice daily.    aspirin EC 81 mg tablet Take one tablet by mouth daily. Take with food.    atorvastatin (LIPITOR) 80 mg tablet Take one tablet by mouth at bedtime daily.    cetirizine (ZYRTEC) 10 mg tablet Take one tablet by mouth daily as needed.    cholecalciferol (Vitamin D3) (VITAMIN D-3) 1,000 units tablet Take one tablet by mouth daily.      citalopram (CELEXA) 40 mg tablet Take one-half tablet by mouth daily.    cyanocobalamin (vitamin B-12) 1,000 mcg tablet Take one tablet by mouth daily.    dorzolamide (TRUSOPT) 2 % ophthalmic solution Apply one drop to both eyes twice daily.    ezetimibe (ZETIA) 10 mg tablet TAKE ONE TABLET BY MOUTH DAILY    finasteride (PROSCAR) 5 mg tablet Take one tablet by mouth daily.    insulin glargine-yfgn (SEMGLEE PEN) 100 unit/mL (3 mL) injectable PEN Inject forty Units under the skin daily.    JARDIANCE 25 mg tablet Take one tablet by mouth daily.    metoprolol tartrate (LOPRESSOR) 50 mg tablet Take one-half tablet by mouth twice daily.    nitroglycerin (NITROSTAT) 0.4 mg tablet DISSOLVE 1 TAB UNDER TONGUE FOR CHEST PAIN - IF PAIN REMAINS AFTER 5 MIN, CALL 911 AND REPEAT DOSE. MAX 3 TABS IN 15 MINUTES    NOVOLOG FLEXPEN 100 unit/mL injection PEN Inject 8 units with breakfast, 8-12 units with lunch, and 12-15 units with supper  Indications: 3 units at  lunch, 18 units with dinner    pantoprazole DR (PROTONIX) 20 mg tablet Take one tablet by mouth daily.    travoprost (TRAVATAN Z) 0.004 % ophthalmic solution Apply one drop to both eyes twice daily.    ubidecarenone (COENZYME Q10 PO) Take 1 capsule by mouth daily.            ROS  12 point ROS obtained and negative except what is noted in HPI   Vitals:  BP: (132-161)/(64-91)   Temp:  [36.3 ?C (97.3 ?F)-36.8 ?C (98.2 ?F)]   Pulse:  [60-80]   Respirations:  [16 PER MINUTE-18 PER MINUTE]   SpO2:  [94 %-100 %]   O2 Device: None (Room air)  Body mass index is 25.11 kg/m?Marland Kitchen      Physical Exam:  General: alert, cooperative, NAD  HEENT: normocephalic/atraumatic, non-icteric  Cardio: regular rate, regular rhythm   Pulm: non-labored respirations on RA, no stridor  Abd: soft, non-distended, no erythema  Ext: Puncture site at the L groin with no active drainage, unable to express drainage, no surrounding erythema, palpable small fluid collection is present, warm, dry, no edema/cyanosis, palpable DP/PT bilaterally  Neuro: grossly intact  Psych: behavior and mood appropriate        Lab/Radiology/Other Diagnostic Tests:      Lab Results   Component Value Date/Time    HGB 13.3 (L) 04/10/2023 11:12 AM    HCT 41.1 04/10/2023 11:12 AM    WBC 5.9 04/10/2023 11:12 AM    PLTCT 141 (L) 04/10/2023 11:12 AM    INR 1.0 04/06/2023 07:29 AM    INR 1.1 05/03/2016 11:45 AM     Lab Results   Component Value Date/Time    NA 139 04/10/2023 11:12 AM    K 4.5 04/10/2023 11:12 AM    CL 111 (H) 04/10/2023 11:12 AM    CO2 20 (L) 04/10/2023 11:12 AM    BUN 27 (H) 04/10/2023 11:12 AM  CR 1.05 04/10/2023 11:12 AM    MG 2.0 11/29/2022 06:04 AM    PO4 2.7 11/29/2022 06:04 AM    CA 9.8 04/10/2023 11:12 AM     Lab Results   Component Value Date/Time    GLUPOC 120 (H) 04/06/2023 07:29 AM    GLUPOC 120 (H) 12/07/2022 01:25 PM    GLUPOC 130 (H) 12/07/2022 11:19 AM                 US DOPPLER ART LE LEFT   Final Result         There is a small, elongated complex fluid collection within the left groin measuring 4.0 x 1.6 x 0.7 cm without internal blood flow on color Doppler interrogation. This is compatible with a small post procedural hematoma.      No pseudoaneurysm or AV fistula is identified.      The left common femoral artery is patent with a normal multiphasic, high resistive waveform, PSV 52 cm/s. The proximal superficial femoral artery is patent with a normal multiphasic, high resistive waveform, PSV 68 cm/s. The left deep femoral artery is patent with a normal multiphasic, high resistive waveform. PSV 45 cm/s. The left common femoral vein is patent with normal transmitted pulsatility.      By my electronic signature, I attest that I have personally reviewed the images for this examination and formulated the interpretations and opinions expressed in this report          Finalized by Merlene Laughter, M.D. on 04/10/2023 2:25 PM. Dictated by Dalphine Handing, MD on 04/10/2023 2:07 PM.         POC ED US SOFT TISSUE/MUSCULOSKELATAL    (Results Pending)   CT PELVIS W CONTRAST    (Results Pending)                                             Active Wounds

## 2023-04-10 NOTE — Progress Notes
Pharmacy Vancomycin Note  Subjective:   Marc Nelson is a 77 y.o. male being treated for skin/skin structure infection.    Assessment:   Target levels for this patient:  1.  AUC (mcg*h/mL):  400-600  2.  Trough (mcg/mL): <15    Plan:   Start vancomycin 1,250mg  q24 hours. Predicted AUC 405 mcg*h/mL and trough 8.8 mcg/mL based on population kinetics.  Next scheduled level(s): at steady state  Pharmacy will continue to monitor and adjust therapy as needed.      Objective:     Drug Levels:  No results found for: VANCOMYCIN 2HR POST DOSE, VANCOMYCIN TROUGH, VANCOMYCIN RANDOM    Current Vancomycin Orders   Medication Dose Route Frequency    vancomycin (VANCOCIN) 1,250 mg in sodium chloride 0.9% (NS) 250 mL IVPB (Vial/Bag Adapter)  1,250 mg Intravenous Q24H*    And    vancomycin, pharmacy to manage  1 each Service Per Pharmacy       Recent Vancomycin Dosing and Administration:  Recent Vancomycin Admin        Ordered but not administered                    Start Date of  vancomycin therapy: 04/10/2023    White Blood Cells   Date/Time Value Ref Range Status   04/10/2023 1112 5.9 4.5 - 11.0 K/UL Final     Creatinine   Date/Time Value Ref Range Status   04/10/2023 1112 1.05 0.4 - 1.24 MG/DL Final     Blood Urea Nitrogen   Date/Time Value Ref Range Status   04/10/2023 1112 27 (H) 7 - 25 MG/DL Final     Estimated CrCl: 61  No intake or output data in the 24 hours ending 04/10/23 1733     Dosing BW:  80 kg     Turner Daniels, [facility]  04/10/2023

## 2023-04-10 NOTE — H&P (View-Only)
Cardiology History and Physical Examination      Name: Marc Nelson        Birthday: 1945/08/22                                MRN: [id number]    Admission Date: 04/10/2023                                                LOS: 0 days      Brief Hospital Course     Marc Nelson is a 77 y.o. man with a PMH of atrial fibrillation s/p Watchman placement 8/21, CAD s/p CABG (2021), HTN, HLD, and insulin-dependent T2DM who presented 04/10/2023 due to continued purulent drainage from L femoral access site 4 days after Watchman placement. Admitted to cardiology for further evaluation/management given recent instrumentation and risk of disseminated infection.      Assessment & Plan     Principal Problem:    Post-operative wound abscess  Active Problems:    HTN, goal below 140/90    DM II (diabetes mellitus, type II), controlled (HCC)    CAD (coronary artery disease)    Paroxysmal A-fib (HCC)      #L femoral access site infection  - s/p Watchman placement 8/21, has had purulent drainage since 8/23  - Afebrile, WBC 5.9, lactic acid 0.6 on admission  - LLE US showed small hematoma  - Concern for potential abscess in setting of recent instrumentation  PLAN:  > Admit to cardiology  > Continuous telemetry  > Ordered CT pelvis wo/w  > Ordered wound cultures, blood culture, MRSA nares  > Consulted vascular surgery, appreciate recs  > Starting vancomycin, will narrow coverage pending culture results      #Atrial Fibrillation s/p Watchman 8/21  #CAD  #HTN  #HLD  - Watchman placed 8/21 d/t recent falls  - On Eliquis 5 mg BID and ASA per protocol s/p Watchman  - PTA atorvastatin 80 mg and Zetia 10 mg  - HTN poorly controlled, only on Lopressor   - At time of exam BP on monitor ~160/118, re-check showed 140s/80s  - GDMT: metoprolol tartrate 25 mg BID, Jardiance 25 mg daily  PLAN:  > Continuing PTA Eliquis, ASA, atorvastatin, and Zetia  > GDMT: Continuing PTA Lopressor   - Holding Jardiance in case procedure is indicated   - Starting losartan 25 mg       #T2DM (insulin-dependent)  - PTA Novolog 8-15 units TID, glargine 40 units qhs, Jardiance 25 mg daily  PLAN:  > Holding PTA Jardiance as above  > Insulin aspart 5 units AC/HS  > Glargine 20 units qhs  > MDCF, Accu-checks      #Depression/anxiety - continuing PTA citalopram 20 mg daily  #BPH - continuing PTA finasteride 5 mg daily  #GERD - continuing PtA pantoprazole 20 mg daily      VTE ppx: SCDs, Eliquis  Diet: Cardiac diet  Code status: FULL CODE    Dispo: Admit to Cardiology      Patient discussed with attending physician, Dr. Angus Palms.    Arabella Merles, MD  PGY-1, Internal Medicine  _________________________________________________________________________  Subjective     History of Present Illness: Marc Nelson is a 76 y.o. male with PMH of atrial fibrillation s/p Watchman placement 8/21, CAD  s/p CABG (2021), HTN, HLD, and insulin-dependent T2DM who presented 04/10/2023 due to continued purulent drainage from L femoral access site 4 days after Watchman placement.      Reports he has had continued drainage from L femoral access site. Described as bloody/pus. Denies fever/chills or exquisite tenderness at site.   Underwent Watchman placement 8/21, denies any other complaints or complications since the procedure.        ROS:   Review of Systems   Constitutional:  Negative for chills, diaphoresis and fever.   Eyes:  Negative for blurred vision.   Respiratory:  Negative for cough and shortness of breath.    Cardiovascular:  Negative for chest pain, palpitations and leg swelling.   Gastrointestinal:  Negative for abdominal pain, constipation, diarrhea, nausea and vomiting.   Genitourinary:  Negative for dysuria.   Musculoskeletal:  Positive for falls.   Neurological:  Negative for dizziness and headaches.       The patient  has a past medical history of Abnormal stress test, Acid reflux, Adverse drug reaction, Angina of effort (HCC) (08/23/2011), Asthma, CAD (coronary artery disease) (08/23/2011), Carotid stenosis, COVID-19 (08/16/2021), DM II (diabetes mellitus, type II), controlled (HCC) (10/22/2009), Dyslipidemia (10/22/2009), Heart murmur, High cholesterol, HLD (hyperlipidemia), HOH (hard of hearing), HTN (hypertension), Kidney disease, Other specified disorders of arteries and arterioles (HCC), Pacemaker lead fracture, Paroxysmal A-fib (HCC) (09/03/2011), and SSS (sick sinus syndrome) (HCC).    The patient  has a past surgical history that includes rotator cuff repair (Bilateral); hernia repair (Left); knee surgery; Finger surgery; coronary artery bypass graft (2013); left heart catheterization; electrocardiogram; doppler echocardiography; myocardial perfusion img study; Pacemaker insertion (10/2015); Cardiac catheterization; cardiovascular stress test; US DUPLEX SCAN CAROTID BILATERAL; heart catheterization; colonoscopy; knee replacement (Bilateral, 06/2020); Cataract removal with implant; carpal tunnel release (Bilateral); carotid endardectomy (Left, 12/02/2021); transesophageal echocardiogram (N/A, 12/15/2021); pacemaker placement (12/15/2021); Leg Debridement (Left, 11/27/2022); and Ankle surgery (Left, 12/07/2022).    Social History     Tobacco Use    Smoking status: Never    Smokeless tobacco: Never   Vaping Use    Vaping status: Never Used   Substance Use Topics    Alcohol use: No    Drug use: No     family history includes Cancer in his father and mother; Cancer-Prostate in his brother and father; Heart murmur in his brother and mother; Hypertension in his brother, father, and mother; Stroke in his father.    Allergies:  Atacand [candesartan], Metformin, Morphine, Oxycodone, and Ragweed      Objective     Scheduled Meds:Continuous Infusions:  PRN and Respiratory Meds:                           Vital Signs: Last Filed                 Vital Signs: 24 Hour Range   BP: 161/73 (08/25 1630)  Temp: 36.8 ?C (98.2 ?F) (08/25 0945)  Pulse: 61 (08/25 1630)  Respirations: 18 PER MINUTE (08/25 0945)  SpO2: 95 % (08/25 1630)  O2 Device: None (Room air) (08/25 0945) BP: (132-161)/(71-91)   Temp:  [36.8 ?C (98.2 ?F)]   Pulse:  [60-80]   Respirations:  [18 PER MINUTE]   SpO2:  [94 %-97 %]   O2 Device: None (Room air)     There were no vitals filed for this visit.      Physical Exam  Vitals reviewed.   Constitutional:  General: He is not in acute distress.     Appearance: He is not toxic-appearing.   HENT:      Head: Normocephalic and atraumatic.      Right Ear: External ear normal.      Left Ear: External ear normal.      Nose: Nose normal.   Eyes:      Extraocular Movements: Extraocular movements intact.      Conjunctiva/sclera: Conjunctivae normal.   Cardiovascular:      Rate and Rhythm: Normal rate and regular rhythm.      Pulses: Normal pulses.      Heart sounds: Normal heart sounds. No murmur heard.  Pulmonary:      Effort: Pulmonary effort is normal. No respiratory distress.      Breath sounds: Normal breath sounds. No wheezing or rhonchi.   Abdominal:      General: Abdomen is flat. Bowel sounds are normal. There is no distension.      Palpations: Abdomen is soft.      Tenderness: There is no abdominal tenderness. There is no guarding or rebound.   Musculoskeletal:      Cervical back: Normal range of motion and neck supple.      Right lower leg: No edema.   Skin:     Comments: L femoral access site wound with surrounding erythema, moderately warm, non-tender   Neurological:      General: No focal deficit present.      Mental Status: He is alert and oriented to person, place, and time.          Laboratory:  Recent Labs     04/10/23  1112   NA 139   K 4.5   CL 111*   CO2 20*   GAP 8   BUN 27*   CR 1.05   GLU 169*   CA 9.8   ALBUMIN 3.8       Recent Labs     04/10/23  1112   WBC 5.9   HGB 13.3*   HCT 41.1   PLTCT 141*   AST 23   ALT 22   ALKPHOS 121*      CrCl cannot be calculated (Unknown ideal weight.).There were no vitals filed for this visit. No results for input(s): PHART, PO2ART in the last 72 hours.    Invalid input(s): PC02A      Pertinent radiology reviewed.

## 2023-04-11 LAB — POC GLUCOSE: POC GLUCOSE: 201 mg/dL — ABNORMAL HIGH (ref 70–100)

## 2023-04-12 ENCOUNTER — Encounter: Admit: 2023-04-12 | Discharge: 2023-04-12 | Payer: MEDICARE

## 2023-04-12 MED ADMIN — AMOXICILLIN-POT CLAVULANATE 875-125 MG PO TAB [33228]: 875 mg | ORAL | @ 17:00:00 | Stop: 2023-04-12 | NDC 00093227534

## 2023-04-19 ENCOUNTER — Encounter: Admit: 2023-04-19 | Discharge: 2023-04-19 | Payer: MEDICARE

## 2023-04-19 NOTE — Telephone Encounter
-----   Message from Olen Pel, MD sent at 04/19/2023  8:35 AM CDT -----  Recommend follow-up with PCP  This is likely contamination  Does not change the management from the cardiovascular standpoint especially with respect to watchman unless the patient has persistent blood cultures.  Thank you.  ----- Message -----  From: Isaac Bliss, RN  Sent: 04/18/2023   9:34 AM CDT  To: Julienne Kass, MD    Good morning Dr. Donnie Coffin,  Culture from inpatient wound assessment for your review.  Any further action at this time?  Thank you, Brandell RN (holiday coverage)

## 2023-04-20 ENCOUNTER — Encounter: Admit: 2023-04-20 | Discharge: 2023-04-20 | Payer: MEDICARE

## 2023-05-03 ENCOUNTER — Encounter: Admit: 2023-05-03 | Discharge: 2023-05-03 | Payer: MEDICARE

## 2023-05-03 ENCOUNTER — Ambulatory Visit: Admit: 2023-05-03 | Discharge: 2023-05-03 | Payer: MEDICARE

## 2023-05-03 DIAGNOSIS — I2089 Angina of effort (HCC): Secondary | ICD-10-CM

## 2023-05-03 DIAGNOSIS — K219 Gastro-esophageal reflux disease without esophagitis: Secondary | ICD-10-CM

## 2023-05-03 DIAGNOSIS — I10 Essential (primary) hypertension: Secondary | ICD-10-CM

## 2023-05-03 DIAGNOSIS — I2510 Atherosclerotic heart disease of native coronary artery without angina pectoris: Secondary | ICD-10-CM

## 2023-05-03 DIAGNOSIS — I6529 Occlusion and stenosis of unspecified carotid artery: Secondary | ICD-10-CM

## 2023-05-03 DIAGNOSIS — I7789 Other specified disorders of arteries and arterioles: Secondary | ICD-10-CM

## 2023-05-03 DIAGNOSIS — T50905A Adverse effect of unspecified drugs, medicaments and biological substances, initial encounter: Secondary | ICD-10-CM

## 2023-05-03 DIAGNOSIS — I480 Paroxysmal atrial fibrillation: Secondary | ICD-10-CM

## 2023-05-03 DIAGNOSIS — E119 Type 2 diabetes mellitus without complications: Secondary | ICD-10-CM

## 2023-05-03 DIAGNOSIS — N289 Disorder of kidney and ureter, unspecified: Secondary | ICD-10-CM

## 2023-05-03 DIAGNOSIS — J45909 Unspecified asthma, uncomplicated: Secondary | ICD-10-CM

## 2023-05-03 DIAGNOSIS — Z9189 Other specified personal risk factors, not elsewhere classified: Secondary | ICD-10-CM

## 2023-05-03 DIAGNOSIS — R9439 Abnormal result of other cardiovascular function study: Secondary | ICD-10-CM

## 2023-05-03 DIAGNOSIS — U071 COVID-19: Secondary | ICD-10-CM

## 2023-05-03 DIAGNOSIS — E7800 Pure hypercholesterolemia, unspecified: Secondary | ICD-10-CM

## 2023-05-03 DIAGNOSIS — R011 Cardiac murmur, unspecified: Secondary | ICD-10-CM

## 2023-05-03 DIAGNOSIS — I495 Sick sinus syndrome: Secondary | ICD-10-CM

## 2023-05-03 DIAGNOSIS — T82110A Breakdown (mechanical) of cardiac electrode, initial encounter: Secondary | ICD-10-CM

## 2023-05-03 DIAGNOSIS — H9190 Unspecified hearing loss, unspecified ear: Secondary | ICD-10-CM

## 2023-05-03 DIAGNOSIS — E785 Hyperlipidemia, unspecified: Secondary | ICD-10-CM

## 2023-05-03 DIAGNOSIS — Z95818 Presence of other cardiac implants and grafts: Secondary | ICD-10-CM

## 2023-05-03 NOTE — Progress Notes
Date of Service: 05/03/2023    Marc Nelson is a 77 y.o. male.             CARDIOLOGY CONSULT PROGRESS NOTE    Marc Nelson                      Events  Hemodynamic parameters: Normal blood pressure and heart rate noted in our office today.    Subjective:  Inguinal discharge from the left femoral venous access site has completely dissipated  Right side site appears to be normal  No bleeding reported on Eliquis plus aspirin  No stroke or TIA noted.              Assessment & Recs     1.  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  High bleeding risk/high stroke risk  3.  Paroxysmal nonvalvular atrial fibrillation  4.  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  On August 21, he underwent transcatheter left atrial appendage occlusion with a 24 mm watchman flex pro with intracardiac echo and conscious sedation guidance.  2 venous access sites were used: Right femoral 14 [place] and left femoral 11 [place].  He did have postprocedural discharge for which she was seen immediately.  Given his discharge, he was given a prescription for Augmentin for the next 7 days.  Discharge completely dissipated.  Right-sided access site looks normal.  No tenderness noted today on examination.  No active discharge.  Sites appear well-healed.  No bleeding or stroke noted while on aspirin plus Eliquis.  He presents for groin access site check visit    Recommendations    1.  Completely well-healed access sites bilaterally.  No further antibiotics necessary at this juncture  2.  CT abdomen pelvis reviewed.  No active bleeding or retroperitoneal hematoma identified  3.  Continue aspirin plus Eliquis at current doses without any change.  4.  Postprocedure 6-week TEE scheduled on October 3.  Further decision on Eliquis to depend upon results of the TEE.  If device is well-seated, no leaks more than 5 mm, can stop Eliquis and switch to Plavix for up to 6 months post procedure following which aspirin monotherapy is recommended  5.  Further management of other cardiac issues which are currently stable per Dr. Bufford Buttner, primary cardiologist.      Jonell Cluck, MD, [facility], [facility]  Interventional Cardiologist           Medications  Current medications reviewed    Review Of Systems  None besides facts mentioned above      Physical Exam                          Vital Signs: Most Recent   Vitals:    05/03/23 0934   BP: 129/75   BP Source: Arm, Left Upper   Pulse: 69   Temp: 36.1 ?C (97 ?F)   SpO2: 96%   O2 Device: None (Room air)   TempSrc: Skin   PainSc: Zero   Weight: 81.6 kg (180 lb)   Height: 177.8 cm (5' 10)          General Appearance: moderately overweight, no distress   Skin: warm, no ulcers or xanthomas; few ecchymoses  Eyes: conjunctivae and lids normal, pupils are equal and round   Neck Veins: normal JVP , neck veins are not distended   Thyroid: no nodules, masses, tenderness or enlargement   Cardiovascular system: Pulse 86/min, regular rhythm , normal volume, no specific character, felt equally in all peripheries and there was no radio femoral delay.  PMI undisplaced, no palpable thrills/heaves, S1 S2 heard, no murmurs, no rub, no jugular venous distension, no carotid bruit.  Peripheral Circulation: normal peripheral circulation   Pedal Pulses: normal symmetric pedal pulses   Carotid Arteries: normal carotid upstroke bilaterally, no bruits   Respiratory system: No acute distress  No use of accessory muscles  Normal vesicular breath sounds over all the lung fields bilaterally  No added sounds  Bilateral groin access sites normal, no bleeding, no pseudoaneurysm, no bruit, no active discharge.  Appear well-healed.    Lab/Radiology/Other Diagnostic Tests:    Pertinent labs, cardiac imaging and EKG reviewed.    Total Time Today was 40 minutes in the following activities: Preparing to see the patient, Obtaining and/or reviewing separately obtained history, Performing a medically appropriate examination and/or evaluation, Counseling and educating the patient/family/caregiver, Ordering medications, tests, or procedures, Referring and communication with other health care professionals (when not separately reported), Documenting clinical information in the electronic or other health record, Independently interpreting results (not separately reported) and communicating results to the patient/family/caregiver, and Care coordination (not separately reported)      Vitals:    05/03/23 0934   Temp: 36.1 ?C (97 ?F)   O2 Device: None (Room air)   TempSrc: Skin   PainSc: Zero   Weight: 81.6 kg (180 lb)   Height: 177.8 cm (5' 10)     Body mass index is 25.83 kg/m?Marland Kitchen     Past Medical History  Patient Active Problem List    Diagnosis Date Noted    Post-operative wound abscess 04/10/2023    PAF (paroxysmal atrial fibrillation) (HCC) 04/06/2023    Type III open fracture of distal end of left tibia 11/28/2022    Pain, acute due to trauma 11/28/2022    Impaired mobility and ADLs 11/28/2022    Trauma 14/78/2956    Pacemaker lead malfunction, initial encounter 12/15/2021    Asymptomatic stenosis of left carotid artery 11/27/2021    Pacemaker lead fracture 11/10/2021    Left-sided carotid artery disease (HCC) 11/10/2021    Hyperlipemia 06/30/2017    Sinus node dysfunction (HCC) 05/25/2016     Pacemaker in place.       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 There were no episodes of any sustained ventricular or supraventricular arrhythmias noted.   06/21/2012 Echo: EF 60 %, normal LV systolic function. Mild mitral and tricuspid regurgitations. PA pressures were 33 mmHg. LA size 5.4 cm.   07/23/2013 Stress thallium: The patient exercised for 11 minute and 54 seconds. During peak exercise, there was  1 mm horizontal ST depression in II, III, aVF, and V5 and V6. However, SPECT images did not show any significant ischemia. Ejection fraction was noted to be 61 percent.   12/27/13 Holter: The predominating rhythm was NS. Rare Supraventricular Ectopic Beats were observed including Atrial Couplets and a Triplet. Rare Ventricular Ectopic Beats were recognized including Rare Couplets. Patient complained of SOB and weakness. No correlation of his symptoms were noted to any underlying arrhythmia.   06/21/2014 Exercise stress thallium: noted to be normal.   05/06/15 Echo: The LV EF is about 60%. The LA is mildly enlarged. Mild MR & mild TR noted. The PA systolic pressure is normal. LA size 4.5 cm.   10/20/2015 Regadenoson stress [facility]): no evidence of any significant ischemia and normal ejection fraction at 69%.  10/24/2015 Echo: normal left ventricular function.  Ejection fraction 65%.  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 LA size 4.3 cm.      11/24/2015 30-day looping event monitor: multiple episodes of atrial fibrillation and flutter with ventricular rates ranging between 80-110 beats per minute.  One episode of atrial fibrillation was associated with complaints of heart racing  01/06/16 s/p Medtronic LINQ cardiac memory loop recorder at the anterior chest wall (Dr. Bernette Mayers)       Thrombocytopenia (HCC) 08/26/2011    AKI (acute kidney injury) (HCC) 08/26/2011    CAD (coronary artery disease) 08/23/2011    Chest pain 07/30/2011    SOB (shortness of breath) 07/15/2011    Dyslipidemia 10/22/2009    DM II (diabetes mellitus, type II), controlled (HCC) 10/22/2009    HTN, goal below 140/90 01/02/2009         Review of Systems   Constitutional: Negative.   HENT: Negative.     Eyes: Negative.    Cardiovascular: Negative.    Respiratory: Negative.     Endocrine: Negative.    Hematologic/Lymphatic: Negative.    Skin: Negative.    Musculoskeletal: Negative.    Gastrointestinal: Negative.    Genitourinary: Negative.    Neurological: Negative.    Psychiatric/Behavioral: Negative.     Allergic/Immunologic: Negative.      Physical Exam      Cardiovascular Studies      Cardiovascular Health Factors  Vitals BP Readings from Last 3 Encounters:   04/12/23 (!) 144/85   04/06/23 136/79   04/06/23 133/66     Wt Readings from Last 3 Encounters:   05/03/23 81.6 kg (180 lb)   04/12/23 79.7 kg (175 lb 9.6 oz)   04/06/23 76.7 kg (169 lb)     BMI Readings from Last 3 Encounters:   05/03/23 25.83 kg/m?   04/12/23 25.20 kg/m?   04/06/23 24.25 kg/m?      Smoking Social History     Tobacco Use   Smoking Status Never   Smokeless Tobacco Never      Lipid Profile Cholesterol   Date Value Ref Range Status   01/18/2023 79  Final     HDL   Date Value Ref Range Status   01/18/2023 35 (L)  Final     LDL   Date Value Ref Range Status   01/18/2023 35  Final     Triglycerides   Date Value Ref Range Status   01/18/2023 46  Final      Blood Sugar Hemoglobin A1C   Date Value  Ref Range Status   01/18/2023 7.2 (H)  Final     Glucose   Date Value Ref Range Status   04/12/2023 153 (H) 70 - 100 MG/DL Final   44/10/4740 595 (H) 70 - 100 MG/DL Final   63/87/5643 329 (H) 70 - 100 MG/DL Final     Glucose, POC   Date Value Ref Range Status   04/12/2023 184 (H) 70 - 100 MG/DL Final   51/88/4166 063 (H) 70 - 100 MG/DL Final   01/60/1093 235 (H) 70 - 100 MG/DL Final            Current Medications (including today's revisions)   acetaminophen SR (TYLENOL ARTHRITIS PAIN) 650 mg tablet Take one tablet by mouth every 8 hours as needed.    apixaban (ELIQUIS) 5 mg tablet Take one tablet by mouth twice daily.    aspirin EC 81 mg tablet Take one tablet by mouth daily. Take with food.    atorvastatin (LIPITOR) 80 mg tablet Take one tablet by mouth at bedtime daily.    cetirizine (ZYRTEC) 10 mg tablet Take one tablet by mouth daily as needed.    cholecalciferol (Vitamin D3) (VITAMIN D-3) 1,000 units tablet Take one tablet by mouth daily.      citalopram (CELEXA) 40 mg tablet Take one-half tablet by mouth daily.    cyanocobalamin (vitamin B-12) 1,000 mcg tablet Take one tablet by mouth daily.    dorzolamide (TRUSOPT) 2 % ophthalmic solution Apply one drop to both eyes twice daily.    ezetimibe (ZETIA) 10 mg tablet TAKE ONE TABLET BY MOUTH DAILY    finasteride (PROSCAR) 5 mg tablet Take one tablet by mouth daily.    insulin glargine-yfgn (SEMGLEE PEN) 100 unit/mL (3 mL) injectable PEN Inject forty Units under the skin daily.    JARDIANCE 25 mg tablet Take one tablet by mouth daily.    losartan (COZAAR) 25 mg tablet Take one tablet by mouth daily. Indications: high blood pressure    metoprolol tartrate (LOPRESSOR) 50 mg tablet Take one-half tablet by mouth twice daily.    nitroglycerin (NITROSTAT) 0.4 mg tablet DISSOLVE 1 TAB UNDER TONGUE FOR CHEST PAIN - IF PAIN REMAINS AFTER 5 MIN, CALL 911 AND REPEAT DOSE. MAX 3 TABS IN 15 MINUTES    NOVOLOG FLEXPEN 100 unit/mL injection PEN Inject 8 units with breakfast, 8-12 units with lunch, and 12-15 units with supper  Indications: 3 units at  lunch, 18 units with dinner    pantoprazole DR (PROTONIX) 20 mg tablet Take one tablet by mouth daily.    travoprost (TRAVATAN Z) 0.004 % ophthalmic solution Apply one drop to both eyes twice daily. ubidecarenone (COENZYME Q10 PO) Take 1 capsule by mouth daily.

## 2023-05-10 ENCOUNTER — Encounter: Admit: 2023-05-10 | Discharge: 2023-05-10 | Payer: MEDICARE

## 2023-05-10 NOTE — Progress Notes
Report Sheet    TEE           Indication: 45-day Post-Watchman (04/06/23)  Ordering Provider: Riley Nearing    Name: Marc Nelson     Age: 77 y.o.    DOB: [birth year]     MRN: [id number]  Patient phone number: [phone]    Communication Barriers: None    Lab(s) needed: FSBS  Other:     Device check needed: No  Device:   Lab Results   Component Value Date/Time    GENERATOR Medtronic 03/28/2023 04:36 PM    EPDEVTYP IPG 03/28/2023 04:36 PM       Other implanted devices/pumps: None    Pt Called: 9/24 - called pt and wife, sent MyChart instructions  Arrival Time: 10/3 @ 0900  Driver Information: wife Dois Davenport    Date of Last H&P: 05/03/23 with Dr. Donnie Coffin   Additional Information:     Prior TEE: None     Prior DCCV: None     Prior Echo: 11/27/21       Previous TEE/DCCV details:   Left ventricular systolic function is borderline. The visually estimated ejection fraction is 50%. The ejection fraction by Simpson's biplane method is 54%. 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 Elevated left atrial pressure  The right ventricle is mildly dilated. 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  Saline contrast administrations x2 for assessment of interatrial shunting are nondiagnostic  Mild to moderate aortic stenosis: Although the peak systolic flow velocity would be more consistent with mild stenosis, visually the degree of restriction of cusp motion appears somewhat more significant  Aortic valve area 1.20 cm2, aortic valve mean gradient 12 mmHg, aortic valve peak gradient 21 mmHg, aortic valve peak velocity 2.4 m/s and aortic valve velocity ratio 0.38    EF:   ECHO EF   Date Value Ref Range Status   11/27/2021 50 % Final       Anticoag:Eliquis     Dose:5 mg     Frequency:BID     Missed:N/A  Labs   INR    HGB    Platelets    Glucose    NA    K    Creatinine    Other      Medications to HOLD day of:  Vitamins/Supplements  Insulin glargine - take 32 units only the night before or none the morning of  Jardiance  Novolog    Probe   Gastric Surgery    GERD    Swallow difficulty    Head/Neck Surg/Rad L. CEA 11/2021   Chest Surg/Rad PPM   EGD    Varices/Esophag CA    GI bleed    Dental issues      Sedation   COPD    OSA/CPAP    Asthma    Pulm HTN    Anesthesia Issues    Smoker    ETOH & Frequency    Drug Use    Chronic Pain Med    GLP-1 Last dose: N/A     Current Medications:    acetaminophen SR (TYLENOL ARTHRITIS PAIN) 650 mg tablet Take one tablet by mouth every 8 hours as needed.    apixaban (ELIQUIS)  5 mg tablet Take one tablet by mouth twice daily.    aspirin EC 81 mg tablet Take one tablet by mouth daily. Take with food.    atorvastatin (LIPITOR) 80 mg tablet Take one tablet by mouth at bedtime daily.    cetirizine (ZYRTEC) 10 mg tablet Take one tablet by mouth daily as needed.    cholecalciferol (Vitamin D3) (VITAMIN D-3) 1,000 units tablet Take one tablet by mouth daily.      citalopram (CELEXA) 40 mg tablet Take one-half tablet by mouth daily.    cyanocobalamin (vitamin B-12) 1,000 mcg tablet Take one tablet by mouth daily.    dorzolamide (TRUSOPT) 2 % ophthalmic solution Apply one drop to both eyes twice daily.    ezetimibe (ZETIA) 10 mg tablet TAKE ONE TABLET BY MOUTH DAILY    finasteride (PROSCAR) 5 mg tablet Take one tablet by mouth daily.    insulin glargine-yfgn (SEMGLEE PEN) 100 unit/mL (3 mL) injectable PEN Inject forty Units under the skin daily.    JARDIANCE 25 mg tablet Take one tablet by mouth daily.    losartan (COZAAR) 25 mg tablet Take one tablet by mouth daily. Indications: high blood pressure    metoprolol tartrate (LOPRESSOR) 50 mg tablet Take one-half tablet by mouth twice daily. nitroglycerin (NITROSTAT) 0.4 mg tablet DISSOLVE 1 TAB UNDER TONGUE FOR CHEST PAIN - IF PAIN REMAINS AFTER 5 MIN, CALL 911 AND REPEAT DOSE. MAX 3 TABS IN 15 MINUTES    NOVOLOG FLEXPEN 100 unit/mL injection PEN Inject 8 units with breakfast, 8-12 units with lunch, and 12-15 units with supper  Indications: 3 units at  lunch, 18 units with dinner    pantoprazole DR (PROTONIX) 20 mg tablet Take one tablet by mouth daily.    travoprost (TRAVATAN Z) 0.004 % ophthalmic solution Apply one drop to both eyes twice daily.    ubidecarenone (COENZYME Q10 PO) Take 1 capsule by mouth daily.       Past Medical/Surgical History:   Patient Active Problem List    Diagnosis Date Noted    Post-operative wound abscess 04/10/2023    PAF (paroxysmal atrial fibrillation) (HCC) 04/06/2023    Type III open fracture of distal end of left tibia 11/28/2022    Pain, acute due to trauma 11/28/2022    Impaired mobility and ADLs 11/28/2022    Trauma 16/05/9603    Pacemaker lead malfunction, initial encounter 12/15/2021    Asymptomatic stenosis of left carotid artery 11/27/2021    Pacemaker lead fracture 11/10/2021    Left-sided carotid artery disease (HCC) 11/10/2021    Hyperlipemia 06/30/2017    Sinus node dysfunction (HCC) 05/25/2016    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    Essential hypertension 05/25/2016    Bradycardia 03/24/2016    S/P CABG (coronary artery bypass graft) 04/22/2015    Dizziness 12/27/2013    Paroxysmal A-fib (HCC) 09/03/2011    Thrombocytopenia (HCC) 08/26/2011    AKI (acute kidney injury) (HCC) 08/26/2011    CAD (coronary artery disease) 08/23/2011    Chest pain 07/30/2011    SOB (shortness of breath) 07/15/2011    Dyslipidemia 10/22/2009    DM II (diabetes mellitus, type II), controlled (HCC) 10/22/2009    HTN, goal below 140/90 01/02/2009      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  Asthma     CAD (coronary artery disease) 08/23/2011    Carotid stenosis     COVID-19 08/16/2021    no hosp, no SE    DM II (diabetes mellitus, type II), controlled (HCC) 10/22/2009    A1c: 06-2021 7.8    FBS:  ~120    Dyslipidemia 10/22/2009    Heart murmur     High cholesterol     HLD (hyperlipidemia)     HOH (hard of hearing)     has bilat aids    HTN (hypertension)     Kidney disease     Other specified disorders of arteries and arterioles (HCC)     Pacemaker lead fracture     Paroxysmal A-fib (HCC) 09/03/2011    SSS (sick sinus syndrome) [facility])       Surgical History:   Procedure Laterality Date    HX CORONARY ARTERY BYPASS GRAFT  2013    @ [place], x 5 grafts    PACEMAKER INSERTION  10/2015    PPM at [place]--medtronic    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/24/2016    Performed by Zollie Pee, MD at [facility] CATH LAB    Fluoroscopy N/A 03/24/2016    Performed by Zollie Pee, MD at [facility] CATH LAB    Removal Implantable Loop Recorder  03/24/2016    Performed by Zollie Pee, MD at [facility] CATH LAB    KNEE REPLACEMENT Bilateral 06/2020    10-2020    ENDARTERECTOMY CAROTID Left 12/02/2021    Performed by Dewitt Rota, DO at [facility] OR    PACEMAKER PLACEMENT  12/15/2021    replacement    REMOVAL PERMANENT PACEMAKER GENERATOR N/A 12/15/2021    Performed by Janalyn Rouse, MD at [facility] CVOR    REMOVAL PERMANENT PACEMAKER LEAD - DUAL LEAD SYSTEM N/A 12/15/2021    Performed by Janalyn Rouse, MD at [facility] CVOR    INSERTION/ REPLACEMENT PERMANENT PACEMAKER WITH ATRIAL AND VENTRICULAR LEAD N/A 12/15/2021    Performed by Janalyn Rouse, MD at [facility] CVOR    INSERTION/ REPLACEMENT TEMPORARY PACEMAKER LEAD/ CATHETER N/A 12/15/2021    Performed by Janalyn Rouse, MD at [facility] CVOR    TRANSESOPHAGEAL ECHOCARDIOGRAM DURING INTERVENTION N/A 12/15/2021    Performed by Janalyn Rouse, MD at [facility] CVOR    CARDIOTHORACIC SURGERY STANDBY N/A 12/15/2021    Performed by Janalyn Rouse, MD at [facility] CVOR    DEBRIDEMENT WOUND DEEP 20 SQ CM OR LESS - LOWER EXTREMITY Left 11/27/2022    Performed by Charlyne Quale, MD at [facility] OR    OPEN TREATMENT BIMALLEOLAR ANKLE FRACTURE WITH/ WITHOUT INTERNAL FIXATION Left 12/07/2022    Performed by Charlyne Quale, MD at [facility] OR    PERCUTANEOUS CLOSURE LEFT ATRIAL APPENDAGE WITH ENDOCARDIAL IMPLANT N/A 04/06/2023    Performed by Julienne Kass, MD at [facility] CATH LAB    INTRACARDIAC ECHOCARDIOGRAPHY N/A 04/06/2023    Performed by Julienne Kass, MD at [facility] CATH LAB    CARDIAC CATHERIZATION      CARDIOVASCULAR STRESS TEST      CARPAL TUNNEL RELEASE Bilateral     CATARACT REMOVAL WITH IMPLANT      COLONOSCOPY      DOPPLER ECHOCARDIOGRAPHY      ELECTROCARDIOGRAM      FINGER SURGERY      right hand 5th finger laceration repair    HX HEART CATHETERIZATION      HX HERNIA  REPAIR Left     HX KNEE SURGERY      right    HX ROTATOR CUFF REPAIR Bilateral     LEFT HEART CATHETERIZATION      MYOCARDIAL PERFUSION IMG STUDY      US DUPLEX SCAN CAROTID BILATERAL         Allergies:   Allergies   Allergen Reactions    Atacand [Candesartan] SEE COMMENTS     hyperkalemia    Metformin DIARRHEA and STOMACH UPSET    Morphine NAUSEA AND VOMITING    Oxycodone NAUSEA AND VOMITING    Ragweed EYE IRRITATION and SNEEZING     Baron Sane, RN

## 2023-05-10 NOTE — Telephone Encounter
Spoke with pt's wife per pt request regarding TEE scheduled for 10/3 @ 1000.    Instructed to check into the cardiology clinic @ 0900. Instructed to be NPO after midnight. Instructed pt to take morning meds with small sip of water but to hold vitamins, supplements, jardiance, novolog, and only take 80% of long acting. Instructed pt to wear comfortable clothing, and no oils/lotions to chest morning of. Instructed pt that they will be receiving IV sedation and must have a driver present to take them home. Pt verbalizing understanding & all questions answered. Pt given callback number [phone] to call if questions arise prior to procedure. Instructions also sent via MyChart.

## 2023-05-19 ENCOUNTER — Encounter: Admit: 2023-05-19 | Discharge: 2023-05-19 | Payer: MEDICARE

## 2023-05-19 ENCOUNTER — Ambulatory Visit: Admit: 2023-05-19 | Discharge: 2023-05-19 | Payer: MEDICARE

## 2023-05-19 DIAGNOSIS — I2510 Atherosclerotic heart disease of native coronary artery without angina pectoris: Secondary | ICD-10-CM

## 2023-05-19 DIAGNOSIS — H9190 Unspecified hearing loss, unspecified ear: Secondary | ICD-10-CM

## 2023-05-19 DIAGNOSIS — I480 Paroxysmal atrial fibrillation: Secondary | ICD-10-CM

## 2023-05-19 DIAGNOSIS — E785 Hyperlipidemia, unspecified: Secondary | ICD-10-CM

## 2023-05-19 DIAGNOSIS — U071 COVID-19: Secondary | ICD-10-CM

## 2023-05-19 DIAGNOSIS — Z95818 Presence of other cardiac implants and grafts: Secondary | ICD-10-CM

## 2023-05-19 DIAGNOSIS — I7789 Other specified disorders of arteries and arterioles: Secondary | ICD-10-CM

## 2023-05-19 DIAGNOSIS — I10 Essential (primary) hypertension: Secondary | ICD-10-CM

## 2023-05-19 DIAGNOSIS — M1990 Unspecified osteoarthritis, unspecified site: Secondary | ICD-10-CM

## 2023-05-19 DIAGNOSIS — R9439 Abnormal result of other cardiovascular function study: Secondary | ICD-10-CM

## 2023-05-19 DIAGNOSIS — R011 Cardiac murmur, unspecified: Secondary | ICD-10-CM

## 2023-05-19 DIAGNOSIS — I6529 Occlusion and stenosis of unspecified carotid artery: Secondary | ICD-10-CM

## 2023-05-19 DIAGNOSIS — T82110A Breakdown (mechanical) of cardiac electrode, initial encounter: Secondary | ICD-10-CM

## 2023-05-19 DIAGNOSIS — T50905A Adverse effect of unspecified drugs, medicaments and biological substances, initial encounter: Secondary | ICD-10-CM

## 2023-05-19 DIAGNOSIS — E782 Mixed hyperlipidemia: Secondary | ICD-10-CM

## 2023-05-19 DIAGNOSIS — J45909 Unspecified asthma, uncomplicated: Secondary | ICD-10-CM

## 2023-05-19 DIAGNOSIS — I495 Sick sinus syndrome: Secondary | ICD-10-CM

## 2023-05-19 DIAGNOSIS — Z950 Presence of cardiac pacemaker: Secondary | ICD-10-CM

## 2023-05-19 DIAGNOSIS — E119 Type 2 diabetes mellitus without complications: Secondary | ICD-10-CM

## 2023-05-19 DIAGNOSIS — N289 Disorder of kidney and ureter, unspecified: Secondary | ICD-10-CM

## 2023-05-19 DIAGNOSIS — Z951 Presence of aortocoronary bypass graft: Secondary | ICD-10-CM

## 2023-05-19 DIAGNOSIS — I2089 Angina of effort (HCC): Secondary | ICD-10-CM

## 2023-05-19 DIAGNOSIS — E7800 Pure hypercholesterolemia, unspecified: Secondary | ICD-10-CM

## 2023-05-19 DIAGNOSIS — K219 Gastro-esophageal reflux disease without esophagitis: Secondary | ICD-10-CM

## 2023-05-19 LAB — POC GLUCOSE: POC GLUCOSE: 131 — ABNORMAL HIGH (ref 70–100)

## 2023-05-19 MED ORDER — SODIUM CHLORIDE 0.9 % IV SOLP (OR) 500ML
INTRAVENOUS | 0 refills | Status: DC
Start: 2023-05-19 — End: 2023-05-19

## 2023-05-19 MED ORDER — PROPOFOL 10 MG/ML IV EMUL 20 ML (INFUSION)(AM)(OR)
INTRAVENOUS | 0 refills | Status: DC
Start: 2023-05-19 — End: 2023-05-19
  Administered 2023-05-19: 15:00:00 74 ug/kg/min via INTRAVENOUS

## 2023-05-19 MED ORDER — LIDOCAINE (PF) 20 MG/ML (2 %) IJ SOLN
INTRAVENOUS | 0 refills | Status: DC
Start: 2023-05-19 — End: 2023-05-19

## 2023-05-19 MED ORDER — PROPOFOL INJ 10 MG/ML IV VIAL
INTRAVENOUS | 0 refills | Status: DC
Start: 2023-05-19 — End: 2023-05-19

## 2023-05-19 NOTE — Patient Instructions
CARDIOLOGY PROCEDURES           POST SEDATION INSTRUCTIONS      Patient Name: Marc Nelson  MRN#: [id number]  Date: 05/19/2023      Please follow the instructions listed below:      The following day you may experience a minor sore throat.    Please have someone accompany you, as YOU SHOULD NOT drive or operate machinery for at least 12-24 hours following the procedure.    There may be some residual effects from the sedatives during the procedure.  Do not drive a vehicle for up to 24 hours after receiving sedation.  Do not operate heavy or potentially harmful equipment  Do not make legally binding decisions  Do not drink alcohol for up to 24 hours  Do not communicate through social media for 24 hours.    Other instructions: Please resume all medications as prescribed. May resume diet.     If you have question or concerns about this procedure, please contact the Cardiology office at [phone], and ask to speak to one of the nurses.      Current Medications List:   acetaminophen SR (TYLENOL ARTHRITIS PAIN) 650 mg tablet Take one tablet by mouth every 8 hours as needed.    apixaban (ELIQUIS) 5 mg tablet Take one tablet by mouth twice daily.    aspirin EC 81 mg tablet Take one tablet by mouth daily. Take with food.    atorvastatin (LIPITOR) 80 mg tablet Take one tablet by mouth at bedtime daily.    cetirizine (ZYRTEC) 10 mg tablet Take one tablet by mouth daily as needed.    cholecalciferol (Vitamin D3) (VITAMIN D-3) 1,000 units tablet Take one tablet by mouth daily.      citalopram (CELEXA) 40 mg tablet Take one-half tablet by mouth daily.    cyanocobalamin (vitamin B-12) 1,000 mcg tablet Take one tablet by mouth daily.    dorzolamide (TRUSOPT) 2 % ophthalmic solution Apply one drop to both eyes twice daily.    ezetimibe (ZETIA) 10 mg tablet TAKE ONE TABLET BY MOUTH DAILY    finasteride (PROSCAR) 5 mg tablet Take one tablet by mouth daily.    insulin glargine-yfgn (SEMGLEE PEN) 100 unit/mL (3 mL) injectable PEN Inject forty Units under the skin daily.    JARDIANCE 25 mg tablet Take one tablet by mouth daily.    losartan (COZAAR) 25 mg tablet Take one tablet by mouth daily. Indications: high blood pressure    metoprolol tartrate (LOPRESSOR) 50 mg tablet Take one-half tablet by mouth twice daily.    nitroglycerin (NITROSTAT) 0.4 mg tablet DISSOLVE 1 TAB UNDER TONGUE FOR CHEST PAIN - IF PAIN REMAINS AFTER 5 MIN, CALL 911 AND REPEAT DOSE. MAX 3 TABS IN 15 MINUTES    NOVOLOG FLEXPEN 100 unit/mL injection PEN Inject 8 units with breakfast, 8-12 units with lunch, and 12-15 units with supper  Indications: 3 units at  lunch, 18 units with dinner    pantoprazole DR (PROTONIX) 20 mg tablet Take one tablet by mouth daily.    travoprost (TRAVATAN Z) 0.004 % ophthalmic solution Apply one drop to both eyes twice daily.    ubidecarenone (COENZYME Q10 PO) Take 1 capsule by mouth daily.         Instructions Given To: Louann Sjogren    Instructions Given By: Dagoberto Ligas, RN

## 2023-05-19 NOTE — Progress Notes
Pre-Operative Assessment for TEE or Cardioversion    Date of Service:  05/19/2023    Marc Nelson is a 77 y.o. y.o. male. Who is referred for TEE Indication: 45-Day [employer] .      he has been compliant with his  apixaban (Eliquis) Missed dose: N/Aand ... bleeding. he is Positive for: Loose/False teeth and Hx. Chest surgery/radiation and Positive for: COPD / Asthma.     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    HTN (hypertension)     Kidney disease     Other specified disorders of arteries and arterioles (HCC)     Pacemaker lead fracture     Paroxysmal A-fib (HCC) 09/03/2011    SSS (sick sinus syndrome) [facility])         Surgical History:   Surgical History:   Procedure Laterality Date    HX CORONARY ARTERY BYPASS GRAFT  2013    @ [place], x 5 grafts    PACEMAKER INSERTION  10/2015    PPM at [place]--medtronic    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/24/2016    Performed by Zollie Pee, MD at [facility] CATH LAB    Fluoroscopy N/A 03/24/2016    Performed by Zollie Pee, MD at [facility] CATH LAB    Removal Implantable Loop Recorder  03/24/2016    Performed by Zollie Pee, MD at [facility] CATH LAB    KNEE REPLACEMENT Bilateral 06/2020    10-2020    ENDARTERECTOMY CAROTID Left 12/02/2021    Performed by Dewitt Rota, DO at [facility] OR    PACEMAKER PLACEMENT  12/15/2021    replacement    REMOVAL PERMANENT PACEMAKER GENERATOR N/A 12/15/2021    Performed by Janalyn Rouse, MD at [facility] CVOR    REMOVAL PERMANENT PACEMAKER LEAD - DUAL LEAD SYSTEM N/A 12/15/2021    Performed by Janalyn Rouse, MD at [facility] CVOR    INSERTION/ REPLACEMENT PERMANENT PACEMAKER WITH ATRIAL AND VENTRICULAR LEAD N/A 12/15/2021    Performed by Janalyn Rouse, MD at [facility] CVOR    INSERTION/ REPLACEMENT TEMPORARY PACEMAKER LEAD/ CATHETER N/A 12/15/2021    Performed by Janalyn Rouse, MD at [facility] CVOR    TRANSESOPHAGEAL ECHOCARDIOGRAM DURING INTERVENTION N/A 12/15/2021    Performed by Janalyn Rouse, MD at [facility] CVOR    CARDIOTHORACIC SURGERY STANDBY N/A 12/15/2021    Performed by Janalyn Rouse, MD at [facility] CVOR    DEBRIDEMENT WOUND DEEP 20 SQ CM OR LESS - LOWER EXTREMITY Left 11/27/2022    Performed by Charlyne Quale, MD at [facility] OR    OPEN  TREATMENT BIMALLEOLAR ANKLE FRACTURE WITH/ WITHOUT INTERNAL FIXATION Left 12/07/2022    Performed by Charlyne Quale, MD at [facility] OR    PERCUTANEOUS CLOSURE LEFT ATRIAL APPENDAGE WITH ENDOCARDIAL IMPLANT N/A 04/06/2023    Performed by Julienne Kass, MD at [facility] CATH LAB    INTRACARDIAC ECHOCARDIOGRAPHY N/A 04/06/2023    Performed by Julienne Kass, MD at [facility] CATH LAB    CARDIAC CATHERIZATION      CARDIOVASCULAR STRESS TEST      CARPAL TUNNEL RELEASE Bilateral     CATARACT REMOVAL WITH IMPLANT      COLONOSCOPY      DOPPLER ECHOCARDIOGRAPHY      ELECTROCARDIOGRAM      FINGER SURGERY      right hand 5th finger laceration repair    HX HEART CATHETERIZATION      HX HERNIA REPAIR Left     HX KNEE SURGERY      right    HX ROTATOR CUFF REPAIR Bilateral     LEFT HEART CATHETERIZATION      MYOCARDIAL PERFUSION IMG STUDY      US DUPLEX SCAN CAROTID BILATERAL         Social History     Social History     Tobacco Use    Smoking status: Never    Smokeless tobacco: Never   Vaping Use    Vaping status: Never Used   Substance Use Topics    Alcohol use: No    Drug use: No         Allergies                                        Allergies   Allergen Reactions    Atacand [Candesartan] SEE COMMENTS     hyperkalemia    Metformin DIARRHEA and STOMACH UPSET    Morphine NAUSEA AND VOMITING    Oxycodone NAUSEA AND VOMITING    Ragweed EYE IRRITATION and SNEEZING          Current Medications  Current Outpatient Medications on File Prior to Encounter   Medication Sig Dispense Refill    acetaminophen SR (TYLENOL ARTHRITIS PAIN) 650 mg tablet Take one tablet by mouth every 8 hours as needed. 30 tablet 1    apixaban (ELIQUIS) 5 mg tablet Take one tablet by mouth twice daily.      aspirin EC 81 mg tablet Take one tablet by mouth daily. Take with food.      atorvastatin (LIPITOR) 80 mg tablet Take one tablet by mouth at bedtime daily.      cetirizine (ZYRTEC) 10 mg tablet Take one tablet by mouth daily as needed.      cholecalciferol (Vitamin D3) (VITAMIN D-3) 1,000 units tablet Take one tablet by mouth daily.        citalopram (CELEXA) 40 mg tablet Take one-half tablet by mouth daily.      cyanocobalamin (vitamin B-12) 1,000 mcg tablet Take one tablet by mouth daily.      dorzolamide (TRUSOPT) 2 % ophthalmic solution Apply one drop to both eyes twice daily.      ezetimibe (ZETIA) 10 mg tablet TAKE ONE TABLET BY MOUTH DAILY 90 tablet 3    finasteride (PROSCAR) 5 mg tablet Take one tablet by mouth daily.      insulin glargine-yfgn (SEMGLEE PEN) 100 unit/mL (3 mL) injectable PEN Inject forty Units under the  skin daily.      JARDIANCE 25 mg tablet Take one tablet by mouth daily.      losartan (COZAAR) 25 mg tablet Take one tablet by mouth daily. Indications: high blood pressure 90 tablet 0    metoprolol tartrate (LOPRESSOR) 50 mg tablet Take one-half tablet by mouth twice daily.      nitroglycerin (NITROSTAT) 0.4 mg tablet DISSOLVE 1 TAB UNDER TONGUE FOR CHEST PAIN - IF PAIN REMAINS AFTER 5 MIN, CALL 911 AND REPEAT DOSE. MAX 3 TABS IN 15 MINUTES 25 tablet 0    NOVOLOG FLEXPEN 100 unit/mL injection PEN Inject 8 units with breakfast, 8-12 units with lunch, and 12-15 units with supper  Indications: 3 units at  lunch, 18 units with dinner      pantoprazole DR (PROTONIX) 20 mg tablet Take one tablet by mouth daily.      travoprost (TRAVATAN Z) 0.004 % ophthalmic solution Apply one drop to both eyes twice daily.      ubidecarenone (COENZYME Q10 PO) Take 1 capsule by mouth daily.       No current facility-administered medications on file prior to encounter.       Vitals  Estimated body mass index is 26 kg/m? as calculated from the following:    Height as of an earlier encounter on 05/19/23: 177.8 cm (5' 10).    Weight as of an earlier encounter on 05/19/23: 82.2 kg (181 lb 3.2 oz).       Patient appears alert and oriented: Yes  NPO: for greater than 8 hours  Inpatient IV status:  Placed in Right forearm    Diagnostic Tests  White Blood Cells   Date Value Ref Range Status   04/12/2023 6.9 4.5 - 11.0 K/UL Final     Hemoglobin   Date Value Ref Range Status   04/12/2023 13.1 (L) 13.5 - 16.5 GM/DL Final     Hematocrit   Date Value Ref Range Status   04/12/2023 40.5 40 - 50 % Final     Platelet Count   Date Value Ref Range Status   04/12/2023 145 (L) 150 - 400 K/UL Final     Sodium   Date Value Ref Range Status   04/12/2023 139 137 - 147 MMOL/L Final     Potassium   Date Value Ref Range Status   04/12/2023 4.0 3.5 - 5.1 MMOL/L Final     Magnesium   Date Value Ref Range Status   04/12/2023 1.9 1.6 - 2.6 mg/dL Final     Blood Urea Nitrogen   Date Value Ref Range Status   04/12/2023 22 7 - 25 MG/DL Final     Creatinine   Date Value Ref Range Status   04/12/2023 0.92 0.4 - 1.24 MG/DL Final     Glucose   Date Value Ref Range Status   04/12/2023 153 (H) 70 - 100 MG/DL Final       Last MAC INR Flow Sheet Entry:    Last recorded Lab results:   INR   Date Value Ref Range Status   05/03/2016 1.1 0.8 - 1.2 Final   04/13/2012 2.3  Final   03/30/2012 1.9  Final   03/23/2012 2.3  Final   03/16/2012 1.7  Final   03/09/2012 1.8  Final     INR POC   Date Value Ref Range Status   04/06/2023 1.0 0.8 - 1.2 Final   12/15/2021 1.0 0.8 - 1.2 Final     APTT   Date  Value Ref Range Status   12/02/2021 71.5 (H) 24.0 - 36.5 SEC Final           Blood Cultures  Resulted Micro Last 72 Hrs    No results found         Last TEE date: None  Last Cardioversion date: None  Echo procedures within the past 30 days:  No results found.      Device Information on File  Lab Results   Component Value Date/Time    GENERATOR Medtronic 03/28/2023 04:36 PM    EPDEVTYP IPG 03/28/2023 04:36 PM         Additional Comments:  None    Plan:  Dr. Althea Charon will plan to proceed with the  TEE.

## 2023-05-19 NOTE — Anesthesia Post-Procedure Evaluation
Post-Anesthesia Evaluation    Name: Marc Nelson      MRN: [id number]     DOB: 06-14-1946     Age: 77 y.o.     Sex: male   __________________________________________________________________________     Procedure Information       Anesthesia Start Date/Time: 05/19/23 1000    Scheduled providers: Grant Ruts, RN; Caffie Damme, DO    Procedure: TRANSESOPHAGEAL ECHO    Location: Cardiovascular Medicine: [facility]            Post-Anesthesia Vitals  BP: 130/79 (10/03 1050)  Pulse: 60 (10/03 1050)  Respirations: 16 PER MINUTE (10/03 1050)  SpO2: 94 % (10/03 1050)  O2 Device: None (Room air) (10/03 1050)   Vitals Value Taken Time   BP 130/79 05/19/23 1050   Temp     Pulse 60 05/19/23 1050   Respirations 16 PER MINUTE 05/19/23 1050   SpO2 94 % 05/19/23 1050   O2 Device None (Room air) 05/19/23 1050   ABP     ART BP           Post Anesthesia Evaluation Note    Evaluation location: Pre/Post  Patient participation: recovered; patient participated in evaluation  Level of consciousness: alert    Pain score: 0  Pain management: adequate    Hydration: normovolemia  Temperature: 36.0?C - 38.4?C  Airway patency: adequate    Perioperative Events       Post-op nausea and vomiting: no PONV    Postoperative Status  Cardiovascular status: hemodynamically stable  Respiratory status: spontaneous ventilation  Follow-up needed: none        Perioperative Events  There were no known complications for this encounter.

## 2023-05-19 NOTE — Progress Notes
Care Plan   Care Category & Patient Outcome Goal Met Treatment/  Interventions Plan of the Day RN Name   Cardiovascular  Hemodynamic stability and adequate peripheral perfusion.   Yes   Refer to  patient's chart. Monitor VS per sedation standard. Monitor ECG continuously.  Assess/maintain IV patency.   Grant Ruts, RN   Respiratory  Patent airway, ease of respiration, and adequate oxygenation.   Yes   Refer to  patient's chart. Maintain open airway. Assess respirations. Monitor O2 saturations. Titrate O2 to keep sat>= 95% or baseline.   Grant Ruts, RN   Psychological/Emotional/Spiritual  Cope with procedure with support in place.  Spiritual needs are addressed.     Yes   Refer to  patient's chart. Provide adequate and thorough instructions.  Provide a caring and supportive environment.  Communicate patient?s concerns with other members of the health team.   Grant Ruts, RN   Pain  Patient?s pain goal met.   Yes   Refer to  patient's chart. Prepare patient for potentially uncomfortable procedure.  Observe for verbal/nonverbal complaints of pain.  Assess pain.  Provide comfort measures.   Grant Ruts, RN   Safety/Fall Risk  Free from injury, security maintained.   Yes High Risk    Refer to  patient's chart.   Follow nursing standard of practice for high risks fall patients.   Grant Ruts, RN   Knowledge Base  Verbalize understanding of procedure/information provided.   Yes   Refer to  patient's chart. Describe the procedure along with what symptoms to expect.  Evaluate patient?s understanding of procedure.  Encourage patient to ask questions.  Provide additional information as needed.   Grant Ruts, RN

## 2023-05-19 NOTE — Progress Notes
Cardiovascular Medicine     Date of Service: 05/19/2023    HPI       I had the pleasure of seeing Marc Nelson  for an updated history and physical prior to his 45 day post Watchman implantation TEE.    Marc Nelson is a 77 y.o. male with a past medical history of paroxysmal atrial fibrillation status post watchman implantation April 06, 2023, coronary artery disease, status post CABG times 01/03/2012, hyperlipidemia, hypertension, sick sinus syndrome status post cardiac pacemaker implantation, carotid artery disease, status post left carotid and endarterectomy and type 2 diabetes mellitus.     Patient denies angina, dyspnea, orthopnea, PND, palpitations, near syncope, syncope, TIA and/or claudication symptoms.  He denies recent fever, chills, nausea and/or vomiting.      [name] last ate 9 pm on  05/18/2023 .    Marc Nelson, wife,  will be his driver post procedure today.    Marc Nelson utilizes a cane when walking.  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  Currently he is moving large hay bales.     Documentation @ 45 days, 6 months, 1 year and 2 years post implant:  NYHA class: II  HAS-BLED score: HTN (1), Elderly >65 (1), and Current Antiplatelet or NSAID use (1)  -     HAS-BLED Yearly Risk: 3=3.74%    -     CHA2DS2-VASc Score: HTN (1), DM (1), and >_ 75 (2)  -     CHA2DSVASc Yearly Stroke Risk: 4=4%         Vitals:    05/19/23 0801   BP: 136/88   BP Source: Arm, Left Upper   Pulse: 67   SpO2: 99%   O2 Device: None (Room air)   PainSc: Zero   Weight: 82.2 kg (181 lb 3.2 oz)   Height: 177.8 cm (5' 10)     Body mass index is 26 kg/m?Marland Kitchen     Past Medical History  Patient Active Problem List    Diagnosis Date Noted    Presence of Watchman left atrial appendage closure device 05/19/2023    Post-operative wound abscess 04/10/2023    PAF (paroxysmal atrial fibrillation) (HCC) 04/06/2023    Type III open fracture of distal end of left tibia 11/28/2022    Pain, acute due to trauma 11/28/2022 Impaired mobility and ADLs 11/28/2022    Trauma 16/05/9603    Pacemaker lead malfunction, initial encounter 12/15/2021    Asymptomatic stenosis of left carotid artery 11/27/2021    Pacemaker lead fracture 11/10/2021    Left-sided carotid artery disease (HCC) 11/10/2021    Hyperlipemia 06/30/2017    Sinus node dysfunction (HCC) 05/25/2016     Pacemaker in place.       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 There were no episodes of any sustained ventricular or supraventricular arrhythmias  noted.   06/21/2012 Echo: EF 60 %, normal LV systolic function. Mild mitral and tricuspid regurgitations. PA pressures were 33 mmHg. LA size 5.4 cm.   07/23/2013 Stress thallium: The patient exercised for 11 minute and 54 seconds. During peak exercise, there was 1 mm horizontal ST depression in II, III, aVF, and V5 and V6. However, SPECT images did not show any significant ischemia. Ejection fraction was noted to be 61 percent.   12/27/13 Holter: The predominating rhythm was NS. Rare Supraventricular Ectopic Beats were observed including Atrial Couplets and a Triplet. Rare Ventricular Ectopic Beats were recognized including Rare Couplets. Patient complained of SOB and weakness. No correlation of his symptoms were noted to any underlying arrhythmia.   06/21/2014 Exercise stress thallium: noted to be normal.   05/06/15 Echo: The LV EF is about 60%. The LA is mildly enlarged. Mild MR & mild TR noted. The PA systolic pressure is normal. LA size 4.5 cm.   10/20/2015 Regadenoson stress Cardiol[facility]): no evidence of any significant ischemia and normal ejection fraction at 69%.  10/24/2015 Echo: normal left ventricular function.  Ejection fraction 65%.  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 LA size 4.3 cm.      11/24/2015 30-day looping event monitor: multiple episodes of atrial fibrillation and flutter with ventricular rates ranging between 80-110 beats per minute.  One episode of atrial fibrillation was associated with complaints of heart racing  01/06/16 s/p Medtronic LINQ cardiac memory loop recorder at the anterior chest wall (Dr. Bernette Mayers)       Thrombocytopenia (HCC) 08/26/2011    AKI (acute kidney injury) (HCC) 08/26/2011    CAD (coronary artery disease) 08/23/2011    Chest pain 07/30/2011    SOB (shortness of breath) 07/15/2011    Dyslipidemia 10/22/2009    DM II (diabetes mellitus, type II), controlled (HCC) 10/22/2009    HTN, goal below 140/90 01/02/2009         ROS    Physical Exam:  General Appearance: no acute distress  Skin: warm & intact  Neck Veins: neck veins are flat & not distended  Chest Inspection: chest is normal in appearance  Auscultation/Percussion: lungs clear to auscultation, no rales, rhonchi, or wheezing  Cardiac Rhythm: regular rhythm & normal rate  Cardiac Auscultation: Normal S1 & S2.    Murmurs:  systolic 2/6 cardiac murmur  Extremities: no lower extremity edema; 2+ symmetric distal pulses  Neurologic Exam: oriented to time, place and person; no focal neurologic deficits  Psychiatric: Normal mood and affect.  Behavior is normal. Judgment and thought content normal.        Cardiovascular Studies  Preliminary EKG from today:    Most recent results for 12-Lead ECG   [facility] MAIN ECG TRIAGE ONLY    Collection Time: 04/10/23  9:35 AM   Result Value Status    VENTRICULAR RATE 64 Final    P-R INTERVAL 184 Final    QRS DURATION 120 Final    Q-T INTERVAL 424 Final    QTC CALCULATION (BAZETT) 437 Final    P AXIS 20 Final    R AXIS -32 Final    T AXIS 101 Final    Impression    AV dual-paced rhythm  Abnormal ECG  When compared with ECG of 15-Mar-2023 08:37,  Vent. rate has increased BY   3 BPM  Confirmed  by Santa Lighter [phone]) on 04/10/2023 10:18:01 AM   Most recent results for 12-Lead ECG   ECG 12-LEAD    Collection Time: 03/15/23  8:37 AM   Result Value Status    VENTRICULAR RATE 61 Final    P-R INTERVAL 186 Final    QRS DURATION 112 Final    Q-T INTERVAL 420 Final    QTC CALCULATION (BAZETT) 422 Final    P AXIS  Final    R AXIS -33 Final    T AXIS 84 Final    Impression    AV dual-paced rhythm  Abnormal ECG  When compared with ECG of 07-Sep-2022 08:40,  premature ventricular complexes are no longer present  Confirmed by Myriam Jacobson ([id number]) on 03/15/2023 8:57:40 AM        The ASCVD Risk score (Arnett DK, et al., 2019) failed to calculate for the following reasons:    The patient has a prior MI or stroke diagnosis    Cardiovascular Health Factors  Vitals BP Readings from Last 3 Encounters:   05/19/23 136/88   05/03/23 129/75   04/12/23 (!) 144/85     Wt Readings from Last 3 Encounters:   05/19/23 82.2 kg (181 lb 3.2 oz)   05/03/23 81.6 kg (180 lb)   04/12/23 79.7 kg (175 lb 9.6 oz)     BMI Readings from Last 3 Encounters:   05/19/23 26.00 kg/m?   05/03/23 25.83 kg/m?   04/12/23 25.20 kg/m?      Smoking Social History     Tobacco Use   Smoking Status Never   Smokeless Tobacco Never      Lipid Profile Cholesterol   Date Value Ref Range Status   01/18/2023 79  Final     HDL   Date Value Ref Range Status   01/18/2023 35 (L)  Final     LDL   Date Value Ref Range Status   01/18/2023 35  Final     Triglycerides   Date Value Ref Range Status   01/18/2023 46  Final      Blood Sugar Hemoglobin A1C Date Value Ref Range Status   01/18/2023 7.2 (H)  Final     Glucose   Date Value Ref Range Status   04/12/2023 153 (H) 70 - 100 MG/DL Final   09/60/4540 981 (H) 70 - 100 MG/DL Final   19/14/7829 562 (H) 70 - 100 MG/DL Final     Glucose, POC   Date Value Ref Range Status   04/12/2023 184 (H) 70 - 100 MG/DL Final   13/03/6577 469 (H) 70 - 100 MG/DL Final   62/95/2841 324 (H) 70 - 100 MG/DL Final          Problems Addressed Today  Encounter Diagnoses   Name Primary?    Paroxysmal A-fib (HCC) Yes    S/P CABG (coronary artery bypass graft)     Cardiac pacemaker in situ     Essential hypertension     Mixed hyperlipidemia     Presence of Watchman left atrial appendage closure device        Assessment and Plan       Paroxysmal atrial fibrillation    Watchman implantation 04/06/2023  Recommend SBE prophylaxis 6 months status post watchman implantation  Recommend to proceed with 45-day post watchman implantation TEE  Dr. Hilma Favors nursing team will contact the patient postprocedure to notify of any medication changes    2.  Hypertension   A.  Sub-optimally controlled; goal BP  less than 130/80   B. Monitor blood pressure at home and call if consistently greater than 130/80        3.   Hyperlipidemia               A.  01/18/2023: HDL 35, LDL35, trig 46-on Atorvastatin 80mg  and zetia 10 mg daily               B.  Well-controlled continue same medications    4.  Coronary artery disease   A.    08/2011  CABG x5   B.    Regadenoson MPI stress test  January 2023:  no high risk findings, EF 67%   C.  Without anginal symptoms    5.  Complete Heart Block   A.  Medtronic Pacemaker implanted 05/25/2016   B.  Replaced pacemaker and leads 12/2021   B.   Continue with quarterly remote checks and annual in office checks.    6. Carotid Artery disease   A.   status post left carotid endarterectomy 12/02/2021   B.  Without strokelike symptoms    5..   Type II Diabetes   A.   Followed by Dr. Lattie Haw at [facility], APRN-NP      This note was partially dictated using Dragon Medical One speech recognition software.  Occasional wrong-word or sound-alike substitutions may have occurred due to the inherent limitations of voice-recognition software.  Please read the chart carefully and recognize, using context, where the substitutions may have occurred.  Please do not hesitate to contact me for clarification.            Current Medications (including today's revisions)   acetaminophen SR (TYLENOL ARTHRITIS PAIN) 650 mg tablet Take one tablet by mouth every 8 hours as needed.    apixaban (ELIQUIS) 5 mg tablet Take one tablet by mouth twice daily.    aspirin EC 81 mg tablet Take one tablet by mouth daily. Take with food.    atorvastatin (LIPITOR) 80 mg tablet Take one tablet by mouth at bedtime daily.    cetirizine (ZYRTEC) 10 mg tablet Take one tablet by mouth daily as needed.    cholecalciferol (Vitamin D3) (VITAMIN D-3) 1,000 units tablet Take one tablet by mouth daily.      citalopram (CELEXA) 40 mg tablet Take one-half tablet by mouth daily.    cyanocobalamin (vitamin B-12) 1,000 mcg tablet Take one tablet by mouth daily.    dorzolamide (TRUSOPT) 2 % ophthalmic solution Apply one drop to both eyes twice daily.    ezetimibe (ZETIA) 10 mg tablet TAKE ONE TABLET BY MOUTH DAILY    finasteride (PROSCAR) 5 mg tablet Take one tablet by mouth daily.    insulin glargine-yfgn (SEMGLEE PEN) 100 unit/mL (3 mL) injectable PEN Inject forty Units under the skin daily.    JARDIANCE 25 mg tablet Take one tablet by mouth daily.    losartan (COZAAR) 25 mg tablet Take one tablet by mouth daily. Indications: high blood pressure    metoprolol tartrate (LOPRESSOR) 50 mg tablet Take one-half tablet by mouth twice daily.    nitroglycerin (NITROSTAT) 0.4 mg tablet DISSOLVE 1 TAB UNDER TONGUE FOR CHEST PAIN - IF PAIN REMAINS AFTER 5 MIN, CALL 911 AND REPEAT DOSE. MAX 3 TABS IN 15 MINUTES    NOVOLOG FLEXPEN 100 unit/mL injection PEN Inject 8 units with breakfast, 8-12 units with lunch, and 12-15 units with supper  Indications:  3 units at  lunch, 18 units with dinner    pantoprazole DR (PROTONIX) 20 mg tablet Take one tablet by mouth daily.    travoprost (TRAVATAN Z) 0.004 % ophthalmic solution Apply one drop to both eyes twice daily.    ubidecarenone (COENZYME Q10 PO) Take 1 capsule by mouth daily.

## 2023-05-19 NOTE — Anesthesia Pre-Procedure Evaluation
Anesthesia Pre-Procedure Evaluation    Name: Marc Nelson      MRN: [id number]     DOB: 04-04-1946     Age: 77 y.o.     Sex: male   _________________________________________________________________________     Procedure Info:   Procedure Information       Date/Time: 05/19/23 1000    Scheduled providers: Grant Ruts, RN    Procedure: TRANSESOPHAGEAL ECHO    Location: Cardiovascular Medicine: [facility]            Physical Assessment  Vital Signs (last filed in past 24 hours):  BP: 136/88 (10/03 0801)  Pulse: 67 (10/03 0801)  SpO2: 99 % (10/03 0801)  O2 Device: None (Room air) (10/03 0801)  Height: 177.8 cm (5' 10) (10/03 0801)  Weight: 82.2 kg (181 lb 3.2 oz) (10/03 0801)      Patient History   Allergies   Allergen Reactions    Atacand [Candesartan] SEE COMMENTS     hyperkalemia    Metformin DIARRHEA and STOMACH UPSET    Morphine NAUSEA AND VOMITING    Oxycodone NAUSEA AND VOMITING    Ragweed EYE IRRITATION and SNEEZING        Current Medications    Medication Directions   acetaminophen SR (TYLENOL ARTHRITIS PAIN) 650 mg tablet Take one tablet by mouth every 8 hours as needed.   apixaban (ELIQUIS) 5 mg tablet Take one tablet by mouth twice daily.   aspirin EC 81 mg tablet Take one tablet by mouth daily. Take with food.   atorvastatin (LIPITOR) 80 mg tablet Take one tablet by mouth at bedtime daily.   cetirizine (ZYRTEC) 10 mg tablet Take one tablet by mouth daily as needed.   cholecalciferol (Vitamin D3) (VITAMIN D-3) 1,000 units tablet Take one tablet by mouth daily.     citalopram (CELEXA) 40 mg tablet Take one-half tablet by mouth daily.   cyanocobalamin (vitamin B-12) 1,000 mcg tablet Take one tablet by mouth daily.   dorzolamide (TRUSOPT) 2 % ophthalmic solution Apply one drop to both eyes twice daily.   ezetimibe (ZETIA) 10 mg tablet TAKE ONE TABLET BY MOUTH DAILY   finasteride (PROSCAR) 5 mg tablet Take one tablet by mouth daily.   insulin glargine-yfgn (SEMGLEE PEN) 100 unit/mL (3 mL) injectable PEN Inject forty Units under the skin daily.   JARDIANCE 25 mg tablet Take one tablet by mouth daily.   losartan (COZAAR) 25 mg tablet Take one tablet by mouth daily. Indications: high blood pressure   metoprolol tartrate (LOPRESSOR) 50 mg tablet Take one-half tablet by mouth twice daily.   nitroglycerin (NITROSTAT) 0.4 mg tablet DISSOLVE 1 TAB UNDER TONGUE FOR CHEST PAIN - IF PAIN REMAINS AFTER 5 MIN, CALL 911 AND REPEAT DOSE. MAX 3 TABS IN 15 MINUTES   NOVOLOG FLEXPEN 100 unit/mL injection PEN Inject 8 units with breakfast, 8-12 units with lunch, and 12-15 units with supper  Indications: 3 units at  lunch, 18 units with dinner   pantoprazole DR (PROTONIX) 20 mg tablet Take one tablet by mouth daily.   travoprost (TRAVATAN Z) 0.004 % ophthalmic solution Apply one drop to both eyes twice daily.   ubidecarenone (COENZYME Q10 PO) Take 1 capsule by mouth daily.         Review of Systems/Medical History      Patient summary reviewed  Pertinent labs reviewed    PONV Screening: Non-smoker    No history of anesthetic complications    No family history  of anesthetic complications      Airway - negative        Pulmonary           Asthma (rare albuterol use)      Shortness of breath      Cardiovascular          Beta Blocker therapy: Yes        CIED: pacemaker          Manufacture: Medtronic              Device Indication(s): SSS          Not Pacemaker Dependent                    CIED interrogated last 12 months (03/28/23)          Device settings:  DDDR              Battery life > 1 year              Magnet response: Asynchronous mode and 85 bpm                Hypertension          Valvular problems/murmurs (known murmur):  MR and AI      Aortic Stenosis: moderate        No past MI    Coronary artery disease      Coronary artery bypass graft (x5 2013)        No palpitations          Dysrhythmias (metoprolol, Eliquis); atrial fibrillation    No angina (denies since CABG 2013)      PVD (Carotid artery stenosis s/p CEA 12/02/2021)      Hyperlipidemia      No orthopnea      S/p watchman 04/06/23      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)      GI/Hepatic/Renal             GERD (PPI), well controlled            Neuro/Psych         Sensory deficit (hearing deficit)        Psychiatric history          Depression      Musculoskeletal         Fractures (ankle fxs)      Bilateral knee replacement        Endocrine/Other       Diabetes, poorly controlled, type 2, using insulin      Most recent Hgb A1C:7.2 - 9          Constitution - negative       Physical Exam    Airway Findings      Mallampati: III      TM distance: >3 FB      Neck ROM: full      Mouth opening: good      Airway patency: adequate    Dental Findings:       Poor dentition          Cardiovascular Findings:       Rhythm: regular      Rate: normal      Other findings: Murmur (systolic ejection murmur)    Pulmonary Findings:       Breath sounds clear to auscultation.    Abdominal Findings:       Not obese      Abdomen soft  Neurological Findings:       Alert and oriented x 3    Constitutional findings:       No acute distress       Hematology:   Lab Results   Component Value Date    HGB 13.1 04/12/2023    HCT 40.5 04/12/2023    PLTCT 145 04/12/2023    WBC 6.9 04/12/2023    NEUT 41 04/12/2023    ANC 2.80 04/12/2023    ALC 2.29 04/12/2023    MONA 10 04/12/2023    AMC 0.67 04/12/2023    EOSA 16 04/12/2023    ABC 0.02 04/12/2023    MCV 98.8 04/12/2023    MCH 32.0 04/12/2023    MCHC 32.3 04/12/2023    MPV 7.8 04/12/2023    RDW 16.2 04/12/2023         General Chemistry:   Lab Results   Component Value Date    NA 139 04/12/2023    K 4.0 04/12/2023    CL 109 04/12/2023    CO2 25 04/12/2023    GAP 5 04/12/2023    BUN 22 04/12/2023    CR 0.92 04/12/2023    GLU 153 04/12/2023    CA 9.3 04/12/2023    ALBUMIN 3.8 04/10/2023    LACTIC 1.7 12/02/2021    MG 1.9 04/12/2023    TOTBILI 0.5 04/10/2023    PO4 2.7 11/29/2022      Coagulation:   Lab Results   Component Value Date    PTT 71.5 12/02/2021 INR 1.0 04/06/2023    INR 1.1 05/03/2016     Device Check 03/28/23  Mode: DDDR   Battery Longevity: 131.0 months  (10.92 years)      Pacemaker Dependent:  no   Pacing Percentages: A Pacing: 95.96 %  RV Pacing: 99.99 %             Interpretation Summary    Title: Remote Check    03/15/23     * Device Interrogation Reviewed by technical staff  * Alerts or Events: 0 since 12/15/22.  * Battery: OK, 10.92 yrs   * Sensing, impedance and thresholds reviewed  * Programmed parameters DDDR 60/130 bpm.  * Presenting rhythm A paced / V paced at 70 bpm  * Heart Rate Histograms show fairly balanced response.   * A paced 96%/ V paced 100%             EKG 03/15/23  Dual paced rhythm    Intracardiac Echo 04/06/23  Left ventricular systolic function is normal. LVEF=55%. RV systolic function was normal. Mild MR, AI, moderate TR. Right ventricular systolic function is normal. No pericardial effusion. Mild dilated left atrium. No thrombus in the LA or LAA. Morphology and orientation of the interatrial septum was assessed in both the bicaval as well as short axis views. No significant valvular stenosis     TTE 11/27/21  Interpretation Summary  Left ventricular systolic function is borderline. The visually estimated ejection fraction is 50%. The ejection fraction by Simpson's biplane method is 54%. 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 Elevated left atrial pressure  The right ventricle is mildly dilated. 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  LV systolic function was more dynamic in that study.  The aortic valve appeared similar visually and the measured peak velocity was ~2.2-2.5 m/s.  Left atrial pressure estimation is higher in the current study--the average E/e' ratio in the previous study was ~= 9.    FINAL IMPRESSION:    Coronary artery disease.  Severe disease involving the left anterior descending artery which is a bifurcating lesion with a very large diagonal branch.  It is almost like a dual LAD system.  Diffuse disease of the right coronary artery.  There is moderate disease of the circumflex artery.    Normal left ventricular function.  No evidence of any significant mitral regurgitation.  High left ventricular end-diastolic pressures.  No evidence of any significant gradient across the aortic valve.    Anesthesia Plan    ASA score: 3   Plan: MAC  Induction method: intravenous  NPO status: acceptable      Informed Consent  Anesthetic plan and risks discussed with patient.  Use of blood products discussed with patient  Blood Consent: consented      Plan discussed with: CRNA, anesthesiologist and surgeon/proceduralist.  Comments: (I discussed the risks/benefits of proceeding with Monitored Anesthesia Care including: 1)possible awareness during the procedure 2) change of anesthetic plan and conversion to a general anesthetic 3)the need to be awakened during key portions of the procedure by the surgeon.  The patient expressed understanding and will proceed with Monitored Anesthesia Care.  )         PAC Plan  Alerts: Yes        CIED           Aortic Stenosis: moderate

## 2023-05-23 ENCOUNTER — Encounter: Admit: 2023-05-23 | Discharge: 2023-05-23 | Payer: MEDICARE

## 2023-05-23 MED ORDER — CLOPIDOGREL 75 MG PO TAB
75 mg | ORAL_TABLET | Freq: Every day | ORAL | 1 refills | 90.00000 days | Status: AC
Start: 2023-05-23 — End: ?

## 2023-05-23 NOTE — Telephone Encounter
Reviewed with patient's wife Dois Davenport

## 2023-06-17 ENCOUNTER — Encounter: Admit: 2023-06-17 | Discharge: 2023-06-17 | Payer: MEDICARE

## 2023-06-27 ENCOUNTER — Ambulatory Visit: Admit: 2023-06-27 | Discharge: 2023-06-28 | Payer: MEDICARE

## 2023-07-01 ENCOUNTER — Encounter: Admit: 2023-07-01 | Discharge: 2023-07-01 | Payer: MEDICARE

## 2023-07-04 ENCOUNTER — Ambulatory Visit: Admit: 2023-07-04 | Discharge: 2023-07-05 | Payer: MEDICARE

## 2023-07-04 ENCOUNTER — Encounter: Admit: 2023-07-04 | Discharge: 2023-07-04 | Payer: MEDICARE

## 2023-07-09 ENCOUNTER — Encounter: Admit: 2023-07-09 | Discharge: 2023-07-09 | Payer: MEDICARE

## 2023-07-11 ENCOUNTER — Encounter: Admit: 2023-07-11 | Discharge: 2023-07-11 | Payer: MEDICARE

## 2023-07-11 ENCOUNTER — Ambulatory Visit: Admit: 2023-07-11 | Discharge: 2023-07-11 | Payer: MEDICARE

## 2023-07-11 DIAGNOSIS — Z9889 Other specified postprocedural states: Secondary | ICD-10-CM

## 2023-07-11 DIAGNOSIS — I6522 Occlusion and stenosis of left carotid artery: Secondary | ICD-10-CM

## 2023-07-11 NOTE — Progress Notes
Date of Service: 07/11/2023              Chief Complaint   Patient presents with    Follow Up     66yr CAS hx Left CEA 4/23       History of Present Illness  77 year old male with hypertension, hyperlipidemia, CAD, type 2 diabetes who underwent a left carotid endarterectomy on 12/02/2021 and presents today in follow-up. He denies any new strokelike symptoms. He underwent a carotid duplex today which demonstrates less than 50% stenosis bilaterally. He denies any symptoms of postprandial abdominal pain or unintentional weight loss. He denies any symptoms of claudication, rest pain or nonhealing wounds. He continues take his aspirin and statin as prescribed.       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    HTN (hypertension)     Kidney disease     Other specified disorders of arteries and arterioles (HCC)     Pacemaker lead fracture     Paroxysmal A-fib (HCC) 09/03/2011    SSS (sick sinus syndrome) [facility])        Surgical History:   Procedure Laterality Date    HX CORONARY ARTERY BYPASS GRAFT  2013    @ [place], x 5 grafts    PACEMAKER INSERTION  10/2015    PPM at [place]--[employer]    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/24/2016    Performed by Zollie Pee, MD at [facility] CATH LAB    Fluoroscopy N/A 03/24/2016    Performed by Zollie Pee, MD at [facility] CATH LAB    Removal Implantable Loop Recorder  03/24/2016    Performed by Zollie Pee, MD at [facility] CATH LAB    KNEE REPLACEMENT Bilateral 06/2020    10-2020    ENDARTERECTOMY CAROTID Left 12/02/2021    Performed by Dewitt Rota, DO at [facility] OR    PACEMAKER PLACEMENT  12/15/2021    replacement    REMOVAL PERMANENT PACEMAKER GENERATOR N/A 12/15/2021    Performed by Janalyn Rouse, MD at [facility] CVOR    REMOVAL PERMANENT PACEMAKER LEAD - DUAL LEAD SYSTEM N/A 12/15/2021    Performed by Janalyn Rouse, MD at [facility] CVOR    INSERTION/ REPLACEMENT PERMANENT PACEMAKER WITH ATRIAL AND VENTRICULAR LEAD N/A 12/15/2021    Performed by Janalyn Rouse, MD at [facility] CVOR    INSERTION/ REPLACEMENT TEMPORARY PACEMAKER LEAD/ CATHETER N/A 12/15/2021    Performed by Janalyn Rouse, MD at [facility] CVOR    TRANSESOPHAGEAL ECHOCARDIOGRAM DURING INTERVENTION N/A 12/15/2021    Performed by Janalyn Rouse, MD at [facility] CVOR    CARDIOTHORACIC SURGERY STANDBY N/A 12/15/2021    Performed by Janalyn Rouse, MD at [facility] CVOR  DEBRIDEMENT WOUND DEEP 20 SQ CM OR LESS - LOWER EXTREMITY Left 11/27/2022    Performed by Charlyne Quale, MD at [facility] OR    OPEN TREATMENT BIMALLEOLAR ANKLE FRACTURE WITH/ WITHOUT INTERNAL FIXATION Left 12/07/2022    Performed by Charlyne Quale, MD at [facility] OR    PERCUTANEOUS CLOSURE LEFT ATRIAL APPENDAGE WITH ENDOCARDIAL IMPLANT N/A 04/06/2023    Performed by Julienne Kass, MD at [facility] CATH LAB    INTRACARDIAC ECHOCARDIOGRAPHY N/A 04/06/2023    Performed by Julienne Kass, MD at [facility] CATH LAB    CARDIAC CATHERIZATION      CARDIOVASCULAR STRESS TEST      CARPAL TUNNEL RELEASE Bilateral     CATARACT REMOVAL WITH IMPLANT      COLONOSCOPY      DOPPLER ECHOCARDIOGRAPHY      ELECTROCARDIOGRAM      FINGER SURGERY      right hand 5th finger laceration repair    HX HEART CATHETERIZATION      HX HERNIA REPAIR Left     HX KNEE SURGERY      right    HX ROTATOR CUFF REPAIR Bilateral     LEFT HEART CATHETERIZATION      MYOCARDIAL PERFUSION IMG STUDY      US DUPLEX SCAN CAROTID BILATERAL         Allergies:  Allergies   Allergen Reactions    Atacand [Candesartan] SEE COMMENTS     hyperkalemia    Metformin DIARRHEA and STOMACH UPSET    Morphine NAUSEA AND VOMITING    Oxycodone NAUSEA AND VOMITING    Ragweed EYE IRRITATION and SNEEZING       Medication List:   acetaminophen SR (TYLENOL ARTHRITIS PAIN) 650 mg tablet Take one tablet by mouth every 8 hours as needed.    aspirin EC 81 mg tablet Take one tablet by mouth daily. Take with food.    atorvastatin (LIPITOR) 80 mg tablet Take one tablet by mouth at bedtime daily.    cetirizine (ZYRTEC) 10 mg tablet Take one tablet by mouth daily as needed.    cholecalciferol (Vitamin D3) (VITAMIN D-3) 1,000 units tablet Take one tablet by mouth daily.      citalopram (CELEXA) 40 mg tablet Take one-half tablet by mouth daily.    clopiDOGreL (PLAVIX) 75 mg tablet Take one tablet by mouth daily.    cyanocobalamin (vitamin B-12) 1,000 mcg tablet Take one tablet by mouth daily.    dorzolamide (TRUSOPT) 2 % ophthalmic solution Apply one drop to both eyes twice daily.    ezetimibe (ZETIA) 10 mg tablet TAKE ONE TABLET BY MOUTH DAILY    finasteride (PROSCAR) 5 mg tablet Take one tablet by mouth daily.    insulin glargine-yfgn (SEMGLEE PEN) 100 unit/mL (3 mL) injectable PEN Inject forty Units under the skin daily.    JARDIANCE 25 mg tablet Take one tablet by mouth daily.    losartan (COZAAR) 25 mg tablet Take one tablet by mouth daily. Indications: high blood pressure    metoprolol tartrate (LOPRESSOR) 50 mg tablet Take one-half tablet by mouth twice daily.    nitroglycerin (NITROSTAT) 0.4 mg tablet DISSOLVE 1 TAB UNDER TONGUE FOR CHEST PAIN - IF PAIN REMAINS AFTER 5 MIN, CALL 911 AND REPEAT DOSE. MAX 3 TABS IN 15 MINUTES    NOVOLOG FLEXPEN 100 unit/mL injection PEN Inject 8 units with breakfast, 8-12 units with lunch, and 12-15 units with supper  Indications: 3 units at  lunch, 18 units  with dinner    pantoprazole DR (PROTONIX) 20 mg tablet Take one tablet by mouth daily.    travoprost (TRAVATAN Z) 0.004 % ophthalmic solution Apply one drop to both eyes twice daily.    ubidecarenone (COENZYME Q10 PO) Take 1 capsule by mouth daily.       Social History:   reports that he has never smoked. He has never used smokeless tobacco. He reports that he does not drink alcohol and does not use drugs.    Family History   Problem Relation Name Age of Onset    Cancer Mother Johnny Bridge         colon cancer    Hypertension Mother Johnny Bridge     Heart murmur Mother Johnny Bridge     Cancer Father Jake Shark         prostate    Hypertension Father Jake Shark     Stroke Father Jake Shark     Cancer-Prostate Father Jake Shark     Heart murmur Brother Richard     Cancer-Prostate Brother Richard     Hypertension Brother Richard        Review of Systems   See HPI         Vitals:    07/11/23 1052 07/11/23 1101 07/11/23 1104   BP: (!) 156/133 (!) 162/70 136/76   BP Source: Arm, Left Upper Arm, Right Upper Arm, Left Upper   Pulse: 57 61 61   Temp: 36.9 ?C (98.4 ?F)     TempSrc: Temporal     PainSc: Zero     Weight: 84.2 kg (185 lb 9.6 oz)     Height: 177.8 cm (5' 10)       Body mass index is 26.63 kg/m?Marland Kitchen     Physical Exam  General: alert and oriented, no acute distress, conversant  Neck: supple, no JVD, no carotid bruit, well healed left neck incision  CV: regular, no murmur  Resp: clear to auscultation bilaterally  Abdomen: soft, nontender, nondistended, no guarding or rebound, no pulsatile mass  Ext: no clubbing, cyanosis or edema  Vasc: 2+ radial, femoral, DP pulses b/l  Skin: no rashes, lesions, or wounds  Neuro: sensation and motor grossly intact throughout  Psych: normal mood and affect        Assessment and Plan:    1. Left carotid stenosis  VAS US DUPLEX SCAN CAROTID BILATERAL      2. H/O carotid endarterectomy  VAS US DUPLEX SCAN CAROTID BILATERAL         77 year old male with hypertension, hyperlipidemia and left carotid stenosis status post left carotid endarterectomy in April 2023.  He is doing very well and has no new complaints or concerns at this time.  Carotid duplex today demonstrates less than 50% stenosis bilaterally.  Will plan to continue medical management at this time with his aspirin and statin therapy and see him back in 1 year with a repeat carotid duplex.  Goal total cholesterol should be less than 180 and LDL less than 70.  This plan was discussed in detail with the patient who understood and was agreeable to proceed.    Verlon Au, DO

## 2023-07-11 NOTE — Patient Instructions
Your testing is stable at this time. Dr. Lourdes Sledge recommends follow up in 1 year with a carotid artery ultrasound. We will call you a few months before this appointment is due to schedule.    Carotid Artery Disease  What is carotid artery disease?  The carotid arteries are the main blood vessels that send blood and oxygen to the brain. It's called carotid artery disease when these vessels become narrowed. It may also be called carotid artery stenosis. The narrowing is caused by atherosclerosis. This is the buildup of fatty deposits, calcium, fibrous tissue and other cell debris that lines the inside of the artery. Carotid artery disease is like coronary artery disease. In that disease, buildup occurs in the arteries of the heart. That may cause a heart attack.   Carotid artery disease reduces the flow of oxygen to the brain. The brain needs a constant supply of oxygen to work. Even a brief pause in blood supply can cause problems. Brain cells start to die after just a few minutes without blood or oxygen. A stroke can result if the narrowing of the carotid arteries is severe enough that blood flow is blocked. It can block blood flow to the brain if a piece of plaque breaks off. This too can cause a stroke.   What causes carotid artery disease?   Atherosclerosis causes most carotid artery disease. In this condition, fatty deposits, calcium, fibrous tissue, and other cell debris build up along the lining of the arteries. This is called plaque. The plaque narrows the insides of the arteries. This decreases blood flow. Or fully blocks the flow of blood to the brain.   Who is at risk for carotid artery disease?   Risk factors linked with atherosclerosis include:   Older age  Male  Family history  Race  Genetic factors  High cholesterol  High blood pressure  Smoking  Diabetes  Overweight  Diet high in saturated fat  Lack of exercise  These factors increase a person's risk. But they don't always cause the disease. Knowing your risk factors can help you make lifestyle changes. You can work with your healthcare provider to reduce the chance you will get the disease.   What are the symptoms of carotid artery disease?   The disease may have no symptoms. In some cases, the first sign of the disease is a transient ischemic attack (TIA) or stroke.   A TIA is a sudden, short-term loss of blood flow to a part of the brain. It usually lasts a few minutes to an hour. Symptoms go away fully within 24 hours. There are no lasting effects. When symptoms continue, it is a stroke. Symptoms of a TIA or stroke may include:   Sudden weakness or clumsiness of an arm or leg on one side of the body  Sudden paralysis of an arm or leg on one side of the body  Loss of coordination or movement  Confusion, loss of ability to concentrate   Dizziness, fainting, or headache  Numbness or loss of feeling in the face or in an arm or leg  Temporary loss of vision or blurred vision  Inability to speak clearly or slurred speech  If you or a loved one has any of these symptoms, call for medical help right away.  A TIA may be a warning sign that a stroke is about to occur. But TIAs don't happen before all strokes.   The symptoms of a TIA and stroke are the same. A stroke  is loss of blood flow (ischemia) to the brain that lasts long enough to cause brain damage. Brain cells start to die after just a few minutes without oxygen.   The effects after a stroke depends on the size and place in the brain that had loss of blood flow. This may include problems with:   Moving  Speaking  Thinking  Remembering  Bowel and bladder function  Eating  Emotional control  Other vital body functions  Recovery also depends on the size and place of the stroke. A stroke may result in long-term problems, such as weakness in an arm or leg. It may cause paralysis, loss of speech, or even death.   The symptoms of carotid artery disease may look like other health problems. See your healthcare provider for a diagnosis.   How is carotid artery disease diagnosed?   Your healthcare provider will ask about your health history. They will do a physical exam. You will need tests. These may include:   Listening to the carotid arteries. For this test, your healthcare provider places a stethoscope over the carotid artery. This is done to listen for a sound called a bruit (BREW-ee). This sound is made when blood passes through a narrowed artery. A bruit can be a sign of atherosclerosis. But an artery may be diseased and not make this sound.  Carotid artery duplex scan.  This test is done to assess the blood flow of the carotid arteries. A probe called a transducer sends out ultrasonic sound waves. The transducer is also like a microphone. It is placed on the carotid arteries at certain locations and angles. The sound waves move through the skin and other body tissues to the blood vessels. The sound waves echo off the blood cells. The transducer sends the waves to an amplifier. Your healthcare provider can hear the sound waves. Not enough or no sounds may mean blood flow is blocked.  MRI. This test uses large magnets, radio waves, and a computer to make detailed images of tissues in the body. X-rays are not used. For this test, you lie inside a big tube while magnets pass around your body. It?s very loud.   MR angiography.  This test uses MRI technology and IV contrast dye to make the blood vessels visible. Contrast dye causes blood vessels to show up well on the MRI image. This is so the healthcare provider can see them.  CT angiography (CTA).  This test uses X-rays and a computer along with contrast dye to make detailed images of the body. A CTA shows pictures of blood vessels and tissues. It helps find narrowed blood vessels.    Angiography. This test is used to see how blocked the carotid arteries are. It is done by taking X-ray images while a contrast dye is injected. The contrast dye shows the shape and flow of blood through the arteries. X-ray images are made at the same time.  How is carotid artery disease treated?   Treatment will depend on your symptoms, age, and general health. It will also depend on how severe the condition is.   If a carotid artery is less than half narrowed, it is often treated with medicine and lifestyle changes. If the artery is between 50% and 70% narrowed, medicine or surgery may be done.   Medical treatment may include the information below:   Lifestyle changes  Quitting smoking.  This can reduce the risk for carotid artery disease and cardiovascular disease. All nicotine products narrow  the blood vessels. This includes electronic cigarettes. This decreases blood flow through the arteries.  Lowering cholesterol.  Eat a low-fat, low-cholesterol diet. Eat plenty of vegetables, lean meats (no red meats), fruits, and high-fiber grains. Don't eat processed foods, or foods high in saturated and trans-fats. When diet and exercise are not enough to control cholesterol, you may need medicines.  Lowering blood sugar.  High blood sugar (glucose) can cause damage to the lining of the carotid arteries. Control glucose levels through a low-sugar diet, and regular exercise. If you have diabetes, you may need medicine or other treatment.  Exercising. Lack of exercise can cause weight gain. It can raise blood pressure and cholesterol. Exercise can help you keep a healthy weight and reduce risks for carotid artery disease.  Lowering blood pressure. High blood pressure causes wear and tear and inflammation in blood vessels. This raises the risk for artery narrowing. Blood pressure should be below 140/90 mm/Hg for most people. People with diabetes may need even lower blood pressure.    Medicines  Medicines that may be used include:   Antiplatelets. These medicines make platelets in the blood less able to stick together and cause clots. These medicines include aspirin, clopidogrel, and dipyridamole.  Cholesterol medicines. Statins are a type of medicines that lower cholesterol. They include simvastatin and atorvastatin. Studies have shown that some statins can decrease the thickness of the carotid artery wall. This can increase the size of the opening of the artery.  Blood pressure medicines. Several types of medicines work to lower blood pressure.  You may need stronger treatment, especially if you have symptoms, if a carotid artery is narrowed from 50% to 70%.   Surgery is usually advised for carotid narrowing of more than 70%. Surgery lowers the risk for stroke after symptoms. These include TIA or minor stroke.   Types of surgery include:  Carotid endarterectomy (CEA). This is surgery to remove plaque and blood clots from the carotid arteries. CEA may help prevent a stroke in people who have symptoms and a narrowing of 70% or more.  Carotid artery angioplasty with stenting. This is a choice for people who are unable to have CEA. It uses a very small tube (catheter). This tube is put into a blood vessel in the groin. It is pushed up to the carotid arteries. Once the tube is in place, a small balloon is inflated at the tip of it. This opens the artery. Then a stent is put in place. A stent is a thin, metal-mesh tube. It is used to hold the artery open.  What are possible complications of carotid artery disease?   The main complication is a stroke. A stroke can cause serious disability. It may even cause death.   Can carotid artery disease be prevented?   You can prevent or delay the disease like you would prevent heart disease. This includes:   Diet changes. Eat a healthy diet. It should include plenty of fresh fruits and vegetables. Eat lean meats, such as poultry and fish. Eat low-fat or nonfat dairy foods. Limit your intake of salt, sugar, processed foods, saturated fats, and alcohol.   Exercise. Aim for 30 minutes of moderate to vigorous physical activity 5 days per week, or as directed by your healthcare provider. Start with exercise blocks of 5 to 10 minutes if 30 straight minutes is too much for you. Any movement is better than none. Exercises that strengthen muscles should be done 2 days per week. Examples of these are  resistance bands or weights.  Manage your weight. If you are overweight, take steps to lose weight. Ask your healthcare provider for help if you're not sure how to start.  Quit smoking. If you smoke, break the habit. Enroll in a program to help you stop smoking. This can improve your chances of success. Ask your healthcare provider about prescription medicine.  Control stress. Learn ways to manage stress in your home and work life.  When should I call my healthcare provider?   Learn the symptoms of stroke. Have your family members also learn them. If you think you are having symptoms of a stroke, call 911 right away.   Key points about carotid artery disease  Carotid artery disease is when plaque builds up and causes narrowing of the carotid arteries. These arteries send oxygen-rich blood from the heart to the brain.  Narrowing of the carotid arteries can cause a stroke. Symptoms of a stroke should be treated right away.  Eating a healthy diet is one way to reduce the risk of carotid artery disease. Exercise, quitting smoking, blood pressure control, and medicine can also help.  Opening the carotid arteries can be done with surgery. Or with angioplasty and a stent.  Carotid artery disease can be prevented or delayed through healthy lifestyle practices  Carotid artery disease may not have symptoms. See your healthcare provider for screening and diagnosis if you have important risk factors.    Next steps  Tips to help you get the most from a visit to your healthcare provider:  Know the reason for your visit and what you want to happen.  Before your visit, write down questions you want answered.  Bring someone with you to help you ask questions and remember what your provider tells you.  At the visit, write down the name of a new diagnosis, and any new medicines, treatments, or tests. Also write down any new instructions your provider gives you.  Know why a new medicine or treatment is prescribed, and how it will help you. Also know what the side effects are.  Ask if your condition can be treated in other ways.  Know why a test or procedure is recommended and what the results could mean.  Know what to expect if you do not take the medicine or have the test or procedure.  If you have a follow-up appointment, write down the date, time, and purpose for that visit.  Know how you can contact your healthcare provider if you have questions, especially after office hours or on weekends.  [employer] last reviewed this educational content on 02/13/2022  ? 2000-2024 [employer], [place]. All rights reserved. This information is not intended as a substitute for professional medical care. Always follow your healthcare professional's instructions.

## 2023-07-16 ENCOUNTER — Encounter: Admit: 2023-07-16 | Discharge: 2023-07-16 | Payer: MEDICARE

## 2023-07-19 ENCOUNTER — Encounter: Admit: 2023-07-19 | Discharge: 2023-07-19 | Payer: MEDICARE

## 2023-07-19 ENCOUNTER — Ambulatory Visit: Admit: 2023-07-19 | Discharge: 2023-07-20 | Payer: MEDICARE

## 2023-09-13 ENCOUNTER — Encounter: Admit: 2023-09-13 | Discharge: 2023-09-13 | Payer: MEDICARE

## 2023-09-15 ENCOUNTER — Encounter: Admit: 2023-09-15 | Discharge: 2023-09-15 | Payer: MEDICARE

## 2023-09-20 ENCOUNTER — Encounter: Admit: 2023-09-20 | Discharge: 2023-09-20 | Payer: MEDICARE

## 2023-09-21 ENCOUNTER — Ambulatory Visit: Admit: 2023-09-21 | Discharge: 2023-09-21 | Payer: MEDICARE

## 2023-09-21 ENCOUNTER — Encounter: Admit: 2023-09-21 | Discharge: 2023-09-21 | Payer: MEDICARE

## 2023-09-21 DIAGNOSIS — I480 Paroxysmal atrial fibrillation: Secondary | ICD-10-CM

## 2023-09-21 DIAGNOSIS — Z950 Presence of cardiac pacemaker: Secondary | ICD-10-CM

## 2023-09-21 DIAGNOSIS — I10 Essential (primary) hypertension: Secondary | ICD-10-CM

## 2023-09-21 MED ORDER — AMOXICILLIN 500 MG PO CAP
2000 mg | ORAL_CAPSULE | Freq: Once | ORAL | 0 refills | 7.00000 days | Status: AC
Start: 2023-09-21 — End: ?

## 2023-09-21 NOTE — Progress Notes
Date of Service: 09/21/2023    Marc Nelson is a 78 y.o. male.       HPI       Dear Dr. Kandis Ban:    It was a pleasure to see Marc Nelson in my office today for his cardiovascular followup.     He is a very pleasant 78 year old gentleman, who is well known to me for now over 10 years.  He has a history of coronary artery disease, status post 5-vessel bypass surgery in January of 2013.  In addition to that, he has a history of mild-to-moderate aortic stenosis; carotid artery disease, status post left carotid endarterectomy; complete heart block, status post permanent pacemaker; hypertension; paroxysmal atrial fibrillation and hyperlipidemia.    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  He has had quite a few accidents in the form.  He also has been significantly fighting with balance issues.  He has had a couple of falls.  Because of that he was referred for Watchman device.  He had a successful Watchman device in August.  45 days later he had a transesophageal echocardiogram which revealed well-seated Watchman device.  His apixaban was then changed to aspirin and clopidogrel.  He will continue that combination for 4-1/2 months.    Today's in the clinic for a follow-up examination.  Other than his complain of getting tired more easily which has been a chronic complaint he has had no new additional problems.  He denies any chest discomfort.  No shortness of breath.  No orthopnea paroxysmal nocturnal dyspnea.  Fortunately has had no falls.    Has lost some weight.  Down a few pounds.  No signs of any volume overload.  Blood pressures are good.  Lab workup is all excellent with normal renal functions and liver functions.  Lipids look good.  I have reviewed his pacemaker check, his EKG as well as his transesophageal echocardiogram and have answered his questions.    Recommendations:  1.  Patient would need antibiotic prophylaxis for his upcoming dental appointment.  2.  No changes with his medications.  3.  Continue dual antiplatelet therapy for 4-1/2 months since his apixaban was discontinued.  4.  Unless there is any change in symptoms he will see me back in 6 months.    Total time spent with Marc Nelson including reviewing his echocardiogram, EKG as well as his lab workup and my recommendations and answering his questions was 30 minutes in the clinic.      (KXF:[id number])                      Vitals:    09/21/23 0830 09/21/23 0838   BP: 130/70 128/74   BP Source: Arm, Left Upper Arm, Right Upper   Pulse: 64    PainSc: Zero    Weight: 80.3 kg (177 lb 1.6 oz)    Height: 177.8 cm (5' 10)      Body mass index is 25.41 kg/m?Marland Kitchen     Past Medical History  Patient Active Problem List    Diagnosis Date Noted    Presence of Watchman left atrial appendage closure device 05/19/2023    Post-operative wound abscess 04/10/2023    PAF (paroxysmal atrial fibrillation) (HCC) 04/06/2023    Type III open fracture of distal end of left tibia 11/28/2022    Pain, acute due to trauma 11/28/2022  Impaired mobility and ADLs 11/28/2022    Trauma 16/05/9603    Pacemaker lead malfunction, initial encounter 12/15/2021    Asymptomatic stenosis of left carotid artery 11/27/2021    Pacemaker lead fracture 11/10/2021    Left-sided carotid artery disease (HCC) 11/10/2021    Hyperlipemia 06/30/2017    Sinus node dysfunction (HCC) 05/25/2016     Pacemaker in place.       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 There were no episodes of any sustained ventricular or supraventricular arrhythmias noted.   06/21/2012 Echo: EF 60 %, normal LV systolic function. Mild mitral and tricuspid regurgitations. PA pressures were 33 mmHg. LA size 5.4 cm.   07/23/2013 Stress thallium: The patient exercised for 11 minute and 54 seconds. During peak exercise, there was 1 mm horizontal ST depression in II, III, aVF, and V5 and V6. However, SPECT images did not show any significant ischemia. Ejection fraction was noted to be 61 percent.   12/27/13 Holter: The predominating rhythm was NS. Rare Supraventricular Ectopic Beats were observed including Atrial Couplets and a Triplet. Rare Ventricular Ectopic Beats were recognized including Rare Couplets. Patient complained of SOB and weakness. No correlation of his symptoms were noted to any underlying arrhythmia.   06/21/2014 Exercise stress thallium: noted to be normal.   05/06/15 Echo: The LV EF is about 60%. The LA is mildly enlarged. Mild MR & mild TR noted. The PA systolic pressure is normal. LA size 4.5 cm.   10/20/2015 Regadenoson stress Cardiolite [facility]): no evidence of any significant ischemia and normal ejection fraction at 69%.  10/24/2015 Echo: normal left ventricular function.  Ejection fraction 65%.  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 LA size 4.3 cm.      11/24/2015 30-day looping event monitor: multiple episodes of atrial fibrillation and flutter with ventricular rates ranging between 80-110 beats per minute.  One episode of atrial fibrillation was associated with complaints of heart racing  01/06/16 s/p Medtronic LINQ cardiac memory loop recorder at the anterior chest wall (Dr. Bernette Mayers)       Thrombocytopenia (HCC) 08/26/2011    AKI (acute kidney injury) (HCC) 08/26/2011    CAD (coronary artery disease) 08/23/2011    Chest pain 07/30/2011    SOB (shortness of breath) 07/15/2011    Dyslipidemia 10/22/2009    DM II (diabetes mellitus, type II), controlled (HCC) 10/22/2009    HTN, goal below 140/90 01/02/2009         Review of Systems   Constitutional: Negative.   HENT: Negative.     Eyes: Negative.    Cardiovascular:  Positive for dyspnea on exertion.   Respiratory: Negative.     Endocrine: Negative.    Hematologic/Lymphatic: Negative.    Skin: Negative.  Gastrointestinal: Negative.    Genitourinary: Negative.    Neurological:  Positive for loss of balance and weakness.   Psychiatric/Behavioral: Negative.     Allergic/Immunologic: Negative.      Physical Exam  General Appearance: resting comfortably, no acute distress   Neck Veins: neck veins are not distended   Thyroid: no nodules, masses, tenderness or enlargement   Chest Inspection: chest is normal in appearance, CABG incision   Respiratory Effort: breathing is unlabored, no respiratory distress   Auscultation/Percussion: lungs clear to auscultation, no rales, rhonchi, or wheezing   PMI: PMI not enlarged or displaced   Cardiac Rhythm: regular rhythm and normal rate   Cardiac Auscultation: Normal S1 & S2, no S3 or S4, no rub   Murmurs: soft systolic ejection murmur 2/6 at apex   Carotid Arteries: normal carotid upstroke bilaterally, radiation of the systolic ejection murmur noted over both carotids.  Pedal Pulses: normal symmetric pedal pulses   Lower Extremity Edema: no lower extremity edema   Abdominal Exam: soft, non-tender, no masses, bowel sounds normal   Abdominal Aorta: nonpalpable abdominal aorta; no abdominal bruits   Liver & Spleen: no organomegaly   Neurologic Exam: neurological assessment grossly intact   Orientation: oriented to time, place and person     Cardiovascular Studies  June 20, 2012: A 24-hour Holter monitor revealed normal sinus rhythm, short runs of supraventricular arrhythmias, maximum of 10 beats were noted. There were no episodes of any sustained ventricular or supraventricular arrhythmias noted. June 21, 2012: Echo Doppler ejection fraction 60 percent, normal left ventricular systolic function. Mild mitral and tricuspid regurgitations. PA pressures were 33 mmHg. July 23, 2013, stress thallium: The patient exercised for 11 minute and 54 seconds. During peak exercise, there was 1 mm horizontal ST depression in II, III, aVF, and V5 and V6. However, SPECT images did not show any significant ischemia. Ejection fraction was noted to be 61 percent. June 21, 2014, exercise stress thallium noted to be normal. EKG today shows sinus bradycardia, rate of 46 beats per minute. First-degree AV block. No significant ST-T changes are noted. No significant changes compared to patient's previous electrocardiogram done on June 19, 2014   October 20, 2015, regadenoson stress Cardiolite done at [facility] revealed no evidence of any significant ischemia and normal ejection fraction at 69%.  October 24, 2015, echo Doppler shows normal left ventricular function.  Ejection fraction 65%.  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    November 24, 2015, a 30-day looping event monitor showed multiple episodes of atrial fibrillation and flutter with ventricular rates ranging between 80-110 beats per minute. One episode of atrial fibrillation was associated with complaints of heart racing.  May 15, 2018 stress thallium: No evidence of any significant ischemia.  Normal left ventricular function.  May 13, 2018 echo Doppler: Normal left ventricular function ejection fraction 60%.  Normal diastolic function.  Normal left atrial pressure.  Mild mitral regurgitation and mild tricuspid regurgitation.  Aortic valve sclerosis with slightly reduced opening.  November 22, 2019 echocardiogram: Normal left ventricular function.  Mild aortic stenosis.  November 22, 2019 stress thallium: No evidence of any significant ischemia.  December 05, 2020 echocardiogram: Normal ventricular size with mild concentric left ventricular hypertrophy.  Left ventricular systolic function is normal with ejection fraction of 60 to 65%. Diastolic function is indeterminate.  Moderate dilatation of the  left atrium.  Aortic valve is sclerotic with slightly reduced opening.  No significant change from last year.  Estimated PA pressures are 32 mmHg.  August 21, 2021 stress MPI:  SUMMARY/OPINION:  This study is probably normal with no evidence of significant myocardial ischemia.  There is a small sized, mild intensity, reversible perfusion defect in the apical inferior wall that is more prominent on the stress upright images compared to the stress supine images.  There is no associated regional wall motion abnormality.  This is likely suggestive of diaphragmatic attenuation artifact.  Left ventricular systolic function is normal. There are no high risk prognostic indicators present.  The pharmacologic ECG portion of the study is nondiagnostic for ischemia  September 18, 2021 echocardiogram:  The left ventricular systolic function is normal. The visually estimated ejection fraction is 65%.  Grade I (mild) left ventricular diastolic dysfunction. Normal left atrial pressure.  The right ventricle is mildly dilated with preserved systolic function. Pacemaker lead present in the ventricle.  Biatrial dilatation (LA-moderately dilated, RA-mildly dilated).  There is mild mitral annular calcification without stenosis.  Trace mitral valve regurgitation, mild tricuspid valve regurgitation.  Severely sclerotic aortic valve, mild stenosis (MG ~ 14 mmHg, peak velocity= 2.2 m/sec, DI= 0.41 , AVA= 1.4 cm?), trace regurgitation.  Estimated Peak Systolic PA Pressure 33 mmHg  September 18, 2021 carotid duplex:  Mild-moderate atheromatous plaque visualized in bilateral common and internal carotid arteries.  The proximal internal carotid artery has severe heterogeneous plaque with turbulent flow and hemodynamic evidence suggestive of 80-99% stenosis.  Monophasic flow distal to the stenosis. (Severity of stenosis increased compared to prior study 08/23/2011)  There is normal antegrade flow in bilateral vertebral arteries  No evidence of proximal subclavian stenosis bilaterally  October 01, 2021 pacemaker interrogation: Device functioning good  October 23, 2021 CTA of the neck:  1. Calcified atheromatous plaque in proximal left internal carotid artery   with 99% stenosis of the distal bulb.   2. Atheromatous irregularity of the proximal right internal carotid artery   with 50% stenosis.   3. Calcified plaque in proximal right vertebral artery with greater than   90% stenosis.   4. 50% stenosis proximal left vertebral artery   November 27, 2021 Echo:  Left ventricular systolic function is borderline. The visually estimated ejection fraction is 50%. The ejection fraction by Simpson's biplane method is 54%. 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 Elevated left atrial pressure  The right ventricle is mildly dilated. 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  LV systolic function was more dynamic in that study.  The aortic valve appeared similar visually and the measured peak velocity was ~2.2-2.5  m/s.  Left atrial pressure estimation is higher in the current study--the average E/e' ratio in the previous study was ~= 9.  December 02, 2021 left carotid endarterectomy.  Dec 15, 2021 atrial and ventricular lead extraction with placement of a new dual-chamber pacemaker generator.  April 06, 2023 placement of Watchman device.  May 19, 2023 transesophageal echocardiogram:  Well-seated 24 mm Watchman FLX PRO with effective occlusion of the left atrial appendage. No evidence of device related thrombus or peridevice leak.   The left ventricle is normal in size and systolic function. Estimated LVEF of 55%.  Probably normal right ventricular size and systolic function.  Biatrial dilatation.  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   Sclerotic aortic valve with mild to moderate aortic stenosis. Vmax 2.3 m/s. AVA by planimetry of 1.5 cm?.  Mild mitral valve regurgitation  Mild to moderate tricuspid valve regurgitation  Estimated PASP of 27 mmHg + CVP.  September 13, 2022 pacemaker interrogation: Device functioning good.  EKG today shows AV paced rhythm at a rate of 64 bpm.  No significant changes compared to patient's previous electrocardiogram done on April 10, 2023.    Cardiovascular Health Factors  Vitals BP Readings from Last 3 Encounters:   09/21/23 128/74   07/11/23 136/76   05/19/23 130/79     Wt Readings from Last 3 Encounters: 09/21/23 80.3 kg (177 lb 1.6 oz)   07/11/23 84.2 kg (185 lb 9.6 oz)   05/19/23 82.2 kg (181 lb 3.5 oz)     BMI Readings from Last 3 Encounters:   09/21/23 25.41 kg/m?   07/11/23 26.63 kg/m?   05/19/23 26.00 kg/m?      Smoking Social History     Tobacco Use   Smoking Status Never   Smokeless Tobacco Never      Lipid Profile Cholesterol   Date Value Ref Range Status   07/20/2023 96  Final     HDL   Date Value Ref Range Status   07/20/2023 43  Final     LDL   Date Value Ref Range Status   07/20/2023 44  Final     Triglycerides   Date Value Ref Range Status   07/20/2023 47  Final      Blood Sugar Hemoglobin A1C   Date Value Ref Range Status   07/20/2023 7.8 (H)  Final     Glucose   Date Value Ref Range Status   07/20/2023 155 (H)  Final   04/12/2023 153 (H) 70 - 100 MG/DL Final   51/88/4166 063 (H) 70 - 100 MG/DL Final     Glucose, POC   Date Value Ref Range Status   05/19/2023 131 (H) 70 - 100 MG/DL Final   01/60/1093 235 (H) 70 - 100 MG/DL Final   57/32/2025 427 (H) 70 - 100 MG/DL Final          Problems Addressed Today  Encounter Diagnoses   Name Primary?    Paroxysmal A-fib (HCC) Yes    Dyslipidemia     HTN, goal below 140/90        Assessment and Plan     1.  Coronary artery disease status post coronary artery bypass graft surgery.  Denies any chest discomfort.  Recent stress thallium did not reveal any significant ischemia.  2.  Paroxysmal atrial fibrillation patient maintaining a AV paced rhythm.  Patient now s/p Watchman device.  Apixaban has been discontinued after his 45-day transesophageal echocardiogram revealed well-seated Watchman device.  Patient now on dual antiplatelet therapy.  3.  Sick sinus syndrome status atrial and ventricular lead replacement as well as placement of a new pacemaker generator.  Device is functioning good.  4.  Hypertension.  Blood pressures are very well controlled.  5.  Dyslipidemia.  Good lipid numbers.  6.  Diabetes mellitus.  Patient follows his PCP.  7.  High-grade calcified plaque in the proximal left internal carotid artery with 99% stenosis.  Patient is s/p left carotid endarterectomy.  Patient follows with vascular surgery.  Recent carotid duplex study revealed less than 50% stenosis in both carotid arteries.  8.  Aortic stenosis.  Recent transesophageal echocardiogram reveals mild to moderate aortic stenosis.         Current Medications (including today's revisions)   acetaminophen SR (TYLENOL ARTHRITIS PAIN) 650 mg tablet Take one tablet by mouth every 8 hours as needed.    aspirin EC 81 mg tablet Take one tablet by mouth daily. Take with food.    atorvastatin (LIPITOR) 80 mg tablet Take one tablet by mouth at bedtime daily.    cetirizine (ZYRTEC) 10 mg tablet Take one tablet by mouth daily as needed.    cholecalciferol (Vitamin D3) (VITAMIN D-3) 1,000 units tablet Take one tablet by mouth daily.      citalopram (CELEXA) 40 mg tablet Take one-half tablet by mouth daily.    clopiDOGreL (PLAVIX) 75 mg tablet Take one tablet by mouth daily.    cyanocobalamin (vitamin B-12) 1,000 mcg tablet Take one tablet by mouth daily.    dorzolamide (TRUSOPT) 2 % ophthalmic solution Apply one drop to both eyes twice daily.    ezetimibe (ZETIA) 10 mg tablet TAKE ONE TABLET BY MOUTH DAILY    finasteride (PROSCAR) 5 mg tablet Take one tablet by mouth daily.    insulin glargine-yfgn (SEMGLEE PEN) 100 unit/mL (3 mL) injectable PEN Inject forty Units under the skin daily.    JARDIANCE 25 mg tablet Take one tablet by mouth daily.    metoprolol tartrate (LOPRESSOR) 50 mg tablet Take one-half tablet by mouth twice daily.    nitroglycerin (NITROSTAT) 0.4 mg tablet DISSOLVE 1 TAB UNDER TONGUE FOR CHEST PAIN - IF PAIN REMAINS AFTER 5 MIN, CALL 911 AND REPEAT DOSE. MAX 3 TABS IN 15 MINUTES    NOVOLOG FLEXPEN 100 unit/mL injection PEN Inject 8 units with breakfast, 8-12 units with lunch, and 12-15 units with supper  Indications: 3 units at  lunch, 18 units with dinner    pantoprazole DR (PROTONIX) 20 mg tablet Take one tablet by mouth daily.    travoprost (TRAVATAN Z) 0.004 % ophthalmic solution Apply one drop to both eyes twice daily.    ubidecarenone (COENZYME Q10 PO) Take 1 capsule by mouth daily.

## 2023-09-21 NOTE — Patient Instructions
Follow-Up:    -Thank you for allowing [place] to participate in your care today. Your After Visit Summary is being completed by Tawny Asal, RN.    -We would like you to follow up in  6 months with Myriam Jacobson, MD  -You can schedule this appointment on your way out of the office.  However, if you would like to call to make this appointment, please call [phone].      Changes From Today's Office Visit  Prescription sent in for antibiotics prophylaxis for upcoming dental procedure.  You will take the 4 tablets 30-60 minutes prior to your procedure.      Contacting our office:    -Business Hours: Monday-Friday, 8:00 am-4:30 pm (excluding Holidays).     - How to reach [place], important numbers to keep handy   Scheduling:  [phone]   Nurse team: [phone] (monitored during business hours)   After hours triage: [phone]   Financial counselors [phone]    - You can also reach [place] through MyChart.      Results & Testing Follow Up:    -Please allow 5-7 business days for the results of any testing to be reviewed. Please call our office if you have not heard from a nurse within this time frame.    -Should you choose to complete testing at an outside facility, please contact our office after completion of testing so that we can ensure that we have received results for your provider to review.    Lab and test results:  As a part of the CARES act, starting 11/15/2019, some results will be released to you via MyChart immediately and automatically.  You may see results before your provider sees them; however, your provider will review all these results and then they, or one of their team, will notify you of result information and recommendations.   Critical results will be addressed immediately, but otherwise, please allow [place] time to get back with you prior to you reaching out to [place] for questions.  This will usually take about 72 hours for labs and 5-7 days for procedure test results.    -You may receive a survey in the upcoming weeks from [facility] [facility]. Your feedback is important to [place] and helps [place] continue to improve patient care and patient satisfaction.

## 2023-09-22 DIAGNOSIS — E785 Hyperlipidemia, unspecified: Secondary | ICD-10-CM

## 2023-09-27 ENCOUNTER — Ambulatory Visit: Admit: 2023-09-27 | Discharge: 2023-09-27 | Payer: MEDICARE

## 2023-09-27 IMAGING — CT BRAIN WO(Adult)
3 of 4 series · 14 of 47 positions shown, 16 images · non-contrast
Comparison: none

[Series 4: brain cor 5.00 hr40 s3 · coronal · 0.31mm/px · 3 of 33 slices shown]
[im 11/33  brain]
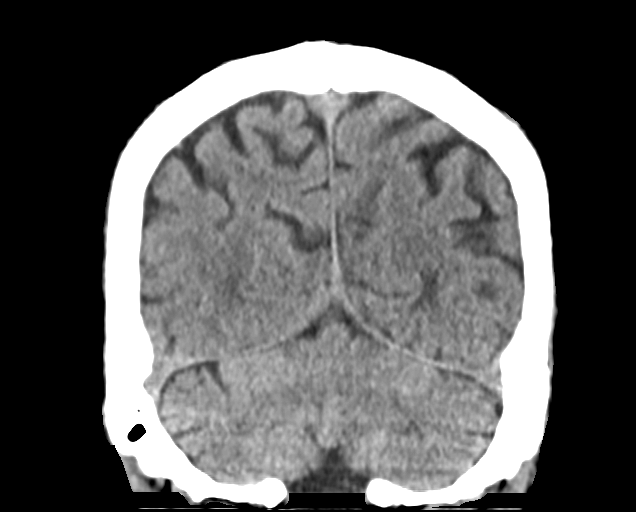
[im 15/33  brain]
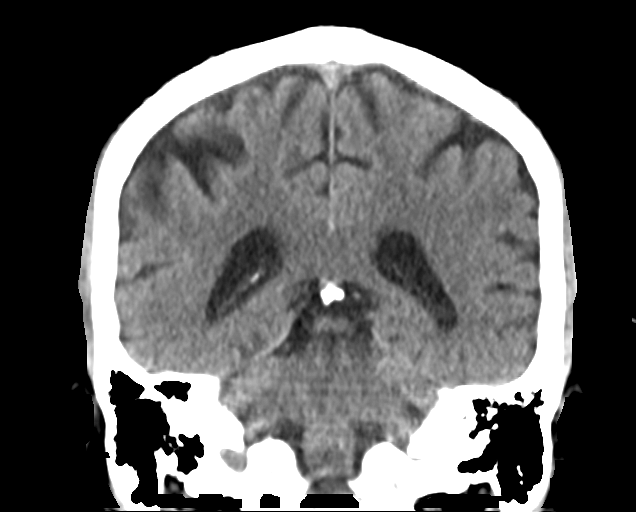
[im 18/33  brain]
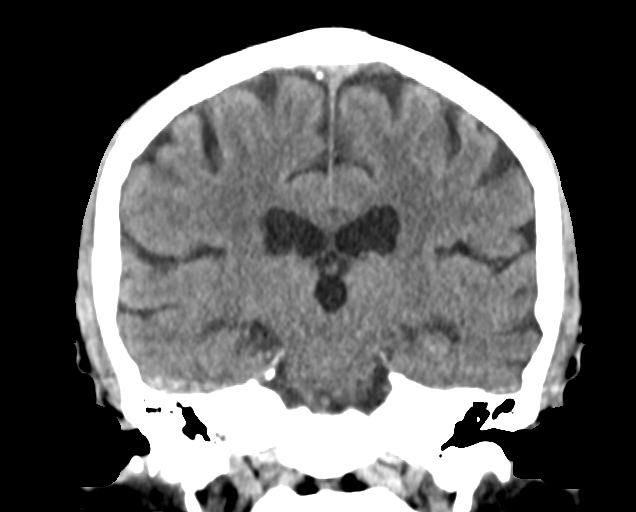

[Series 6: brain sag 5.00 hr40 s3 · sagittal · 0.31mm/px · 3 of 30 slices shown]
[im 10/30  brain]
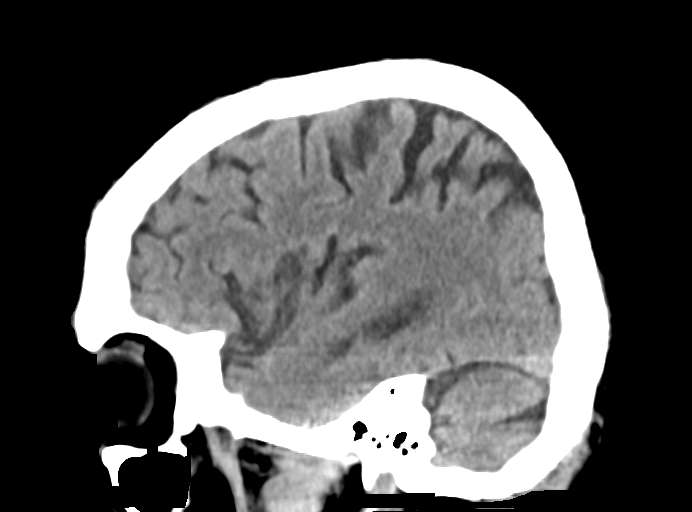
[im 15/30  brain]
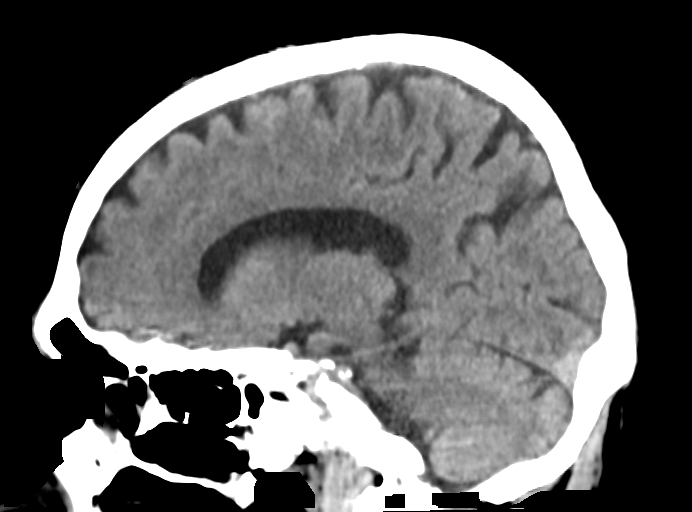
[im 20/30  brain]
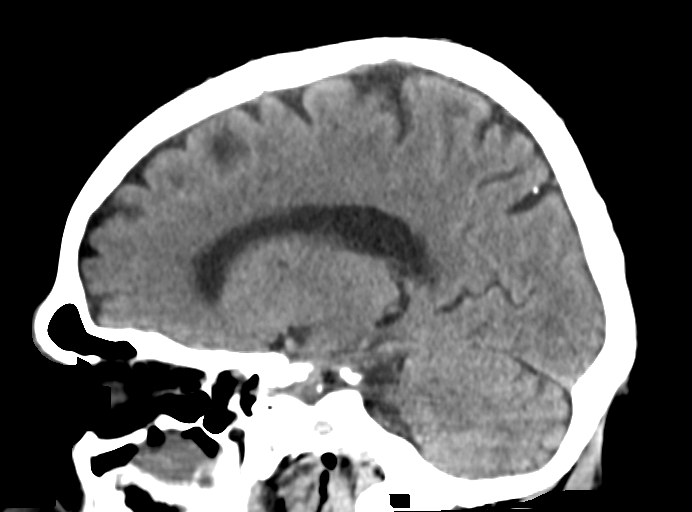

[Series 8: brain ax 2.00 hr60 s3 · axial · 0.38mm/px · z∈[-521,-391]mm · 8 of 63 slices shown, 10 images]
[im 6/63  brain]
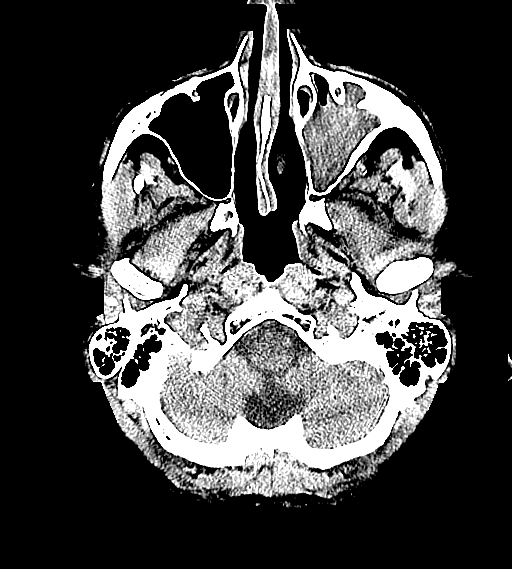
[im 6/63  bone]
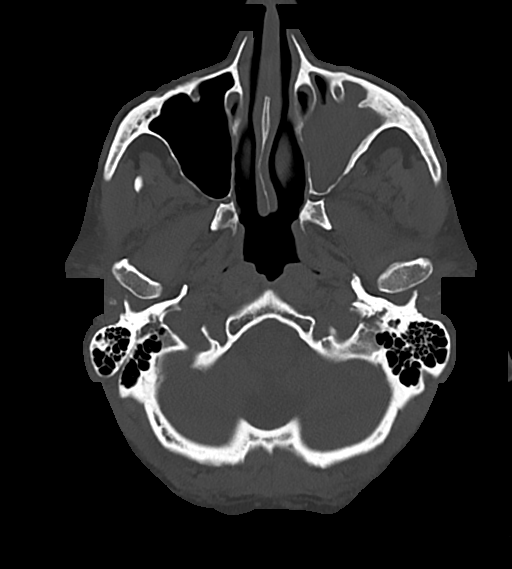
[im 12/63  brain]
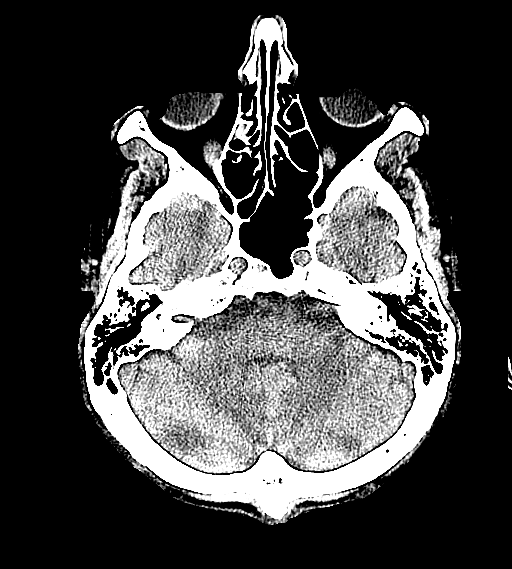
[im 20/63  brain]
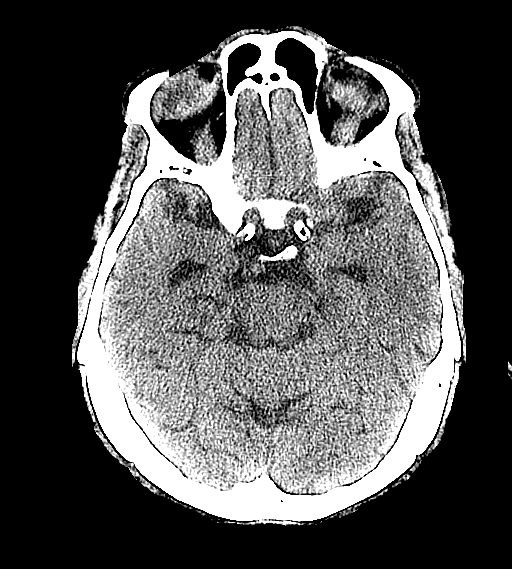
[im 29/63  brain]
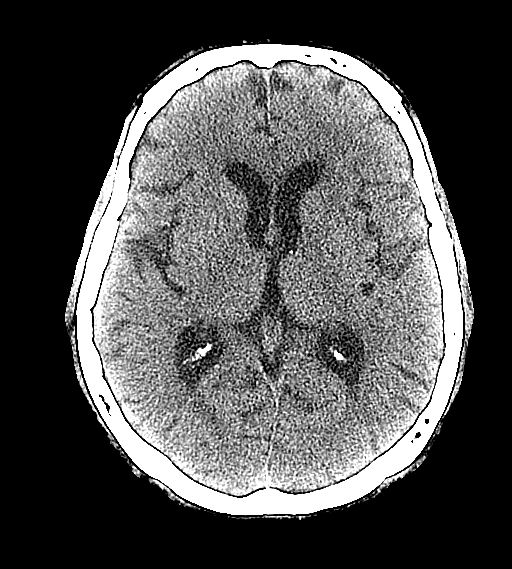
[im 34/63  brain]
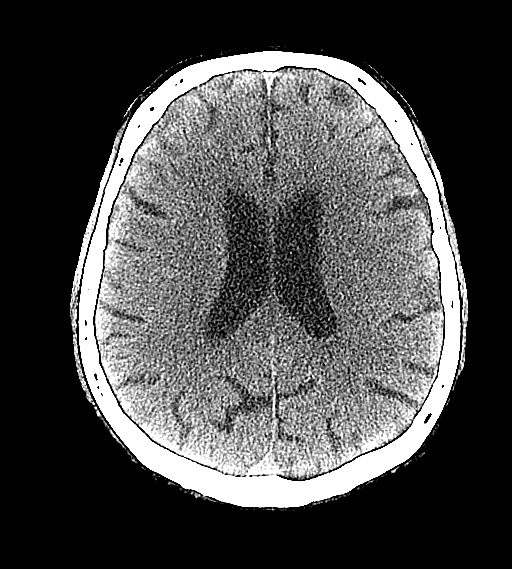
[im 34/63  bone]
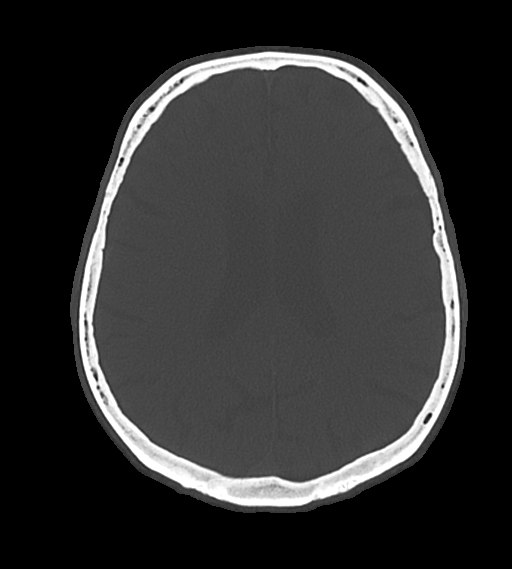
[im 43/63  brain]
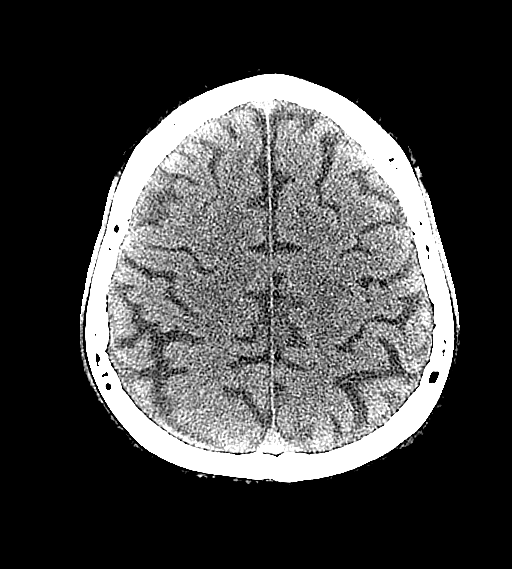
[im 51/63  brain]
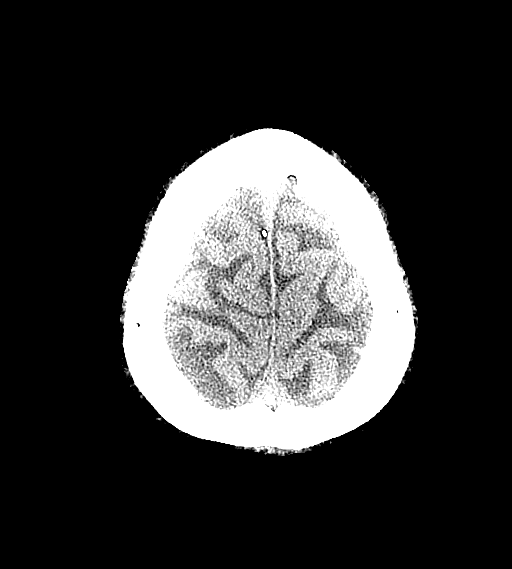
[im 57/63  brain]
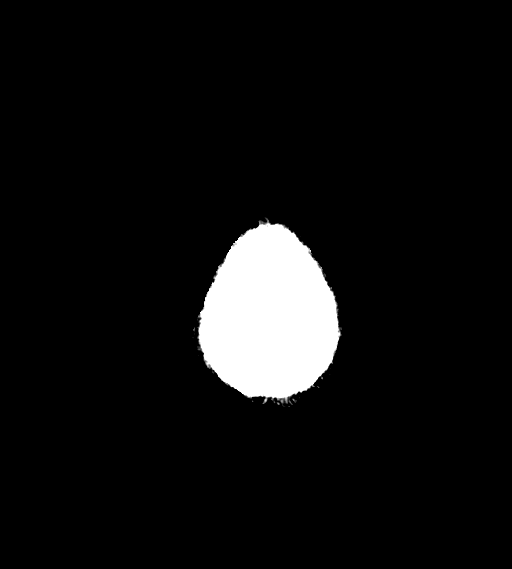

[14 of 47 positions shown; findings below may reference images not displayed]

EXAM

CT HEAD

INDICATION

facial numbnes, hx 90% occlusion/carotid
slurred speech , chest pain and weakness at 9 am . left sided facial drooping , hx of carotid artery
blockage 99%.

TECHNIQUE

All CT scans at this facility use dose modulation, iterative reconstruction, and/or weight based
dosing when appropriate to reduce radiation dose to as low as reasonably achievable.

CT of the head without contrast was performed. # of CT scans in the past year: 0 # of Myocardial
perfusion scans this past year: 0

COMPARISONS

None available at the time of dictation

FINDINGS

There is partial opacification of the ethmoidal air cells. There is partial opacification of the
right maxillary sinus with a large retention cysts. The ventricles, sulci, cisterns are
unremarkable. No acute intracranial hemorrhage, mass, or mass effect.

IMPRESSION
1. [No acute intracranial hemorrhage or infarct.
2. Paranasal sinus disease.
3. Results were communicated to the ER department at [DATE] p.m. and on 11/26/2021.]
4. If the patient's symptoms persists, follow-up MRI is recommended.

Tech Notes:

slurred speech , chest pain and weakness at 9 am . left sided facial drooping , hx of carotid artery
blockage 99%.

## 2023-10-07 ENCOUNTER — Encounter: Admit: 2023-10-07 | Discharge: 2023-10-07 | Payer: MEDICARE

## 2023-10-07 NOTE — Telephone Encounter
 Spoke with wife and advised to stop Plavix at this time.

## 2023-10-07 NOTE — Telephone Encounter
-----   Message from Kathryn S sent at 10/05/2023  7:29 AM CST -----  Regarding: PGU post [facility] med mgmt  Please call patient to remind him to stop Plavix on 10/07/2023.  He saw Dr. Bufford Buttner on 02/05 but want to make sure he stops the Plavix and remains on ASA alone.  Please update med list as appropriate.  Thanks

## 2023-12-02 ENCOUNTER — Encounter: Admit: 2023-12-02 | Discharge: 2023-12-02 | Payer: MEDICARE

## 2023-12-16 ENCOUNTER — Encounter: Admit: 2023-12-16 | Discharge: 2023-12-16 | Payer: MEDICARE

## 2023-12-29 ENCOUNTER — Encounter: Admit: 2023-12-29 | Discharge: 2023-12-29 | Payer: MEDICARE

## 2023-12-29 ENCOUNTER — Ambulatory Visit: Admit: 2023-12-29 | Discharge: 2023-12-30 | Payer: MEDICARE

## 2023-12-29 DIAGNOSIS — R0609 Other forms of dyspnea: Secondary | ICD-10-CM

## 2023-12-29 DIAGNOSIS — Z951 Presence of aortocoronary bypass graft: Secondary | ICD-10-CM

## 2023-12-29 DIAGNOSIS — R079 Chest pain, unspecified: Secondary | ICD-10-CM

## 2023-12-29 DIAGNOSIS — I6522 Occlusion and stenosis of left carotid artery: Secondary | ICD-10-CM

## 2023-12-29 DIAGNOSIS — Z950 Presence of cardiac pacemaker: Secondary | ICD-10-CM

## 2023-12-29 DIAGNOSIS — E785 Hyperlipidemia, unspecified: Secondary | ICD-10-CM

## 2023-12-29 DIAGNOSIS — I10 Essential (primary) hypertension: Secondary | ICD-10-CM

## 2023-12-29 DIAGNOSIS — I480 Paroxysmal atrial fibrillation: Secondary | ICD-10-CM

## 2023-12-29 DIAGNOSIS — Z9889 Other specified postprocedural states: Secondary | ICD-10-CM

## 2023-12-29 DIAGNOSIS — Z95818 Presence of other cardiac implants and grafts: Secondary | ICD-10-CM

## 2023-12-29 DIAGNOSIS — R0989 Other specified symptoms and signs involving the circulatory and respiratory systems: Secondary | ICD-10-CM

## 2023-12-29 DIAGNOSIS — I25119 Atherosclerotic heart disease of native coronary artery with unspecified angina pectoris: Secondary | ICD-10-CM

## 2023-12-29 MED ORDER — ISOSORBIDE MONONITRATE 30 MG PO TB24
30 mg | ORAL_TABLET | Freq: Every morning | ORAL | 1 refills | 90.00000 days | Status: AC
Start: 2023-12-29 — End: ?

## 2023-12-29 NOTE — Patient Instructions
 Thank you for visiting our office today.  Please call [phone] to schedule the office visit with Dr. Katheran Palms    Please complete the following orders:    Keep August appt  Schedule chemical stress test  Schedule echocardiogram  START Isosorbide 30mg  daily    Take your medications as ordered. Check your list with what you have on hand at home.  Should you have any additional questions or concerns, please message me through MyChart or call the office.    Thank you,    Cardiovascular Medicine Team  Nursing voicemail line: [phone]  Fax: [phone]  Scheduling: [phone]     [facility]    Nuclear Stress Test Instructions      Your cardiologist has asked that you have a nuclear stress test (also known as a Myocardial Perfusion Imaging (MPI) test.    This evaluation of your heart muscle consists of two sets of nuclear images and either a Treadmill stress test or a chemical stress test, decided by you and your physician.    You will get an IV placed in your arm for the test.    You will need to be able to raise your arm up by your head for about 20 minutes and lie on your back for about 10 minutes.  Please discuss this with your doctor or talk with the nuclear technologist or nurse if these are a problem for you.    It is recommended not to schedule any other appointment for the same day.      Wear comfortable clothing and walking shoes if you are walking on the treadmill.  Women should wear shorts or comfortable pants instead of dresses.   Sweatshirts or T-shirts work really well for imaging.    If you have a Zio Patch cardiac monitor in place, please reschedule your nuclear stress test after your monitoring period is complete. If you would like to proceed with the nuclear stress test during the monitoring time, the patch will have to be removed and the data will be lost; we can place a new patch following testing.       If you have a cardiac monitor with replacement patches, please inform the nurse prior to your appointment.  The monitor will need to be removed during testing. Please bring replacement patches with you so that we can resume monitoring following your test.     There may be enough time to leave to get a snack after the stress portion of the test.   The Technologist will tell you what time to return for the second set of images.    PLEASE NO CAFFEINE 24 HOURS PRIOR TO TEST:  Examples include coffee, tea, decaffeinated drinks, colas, [facility], Dr. Kathlene Paradise.  Some orange sodas and root beers have caffeine, please check.  No energy drinks, Excedrin, Midol, or any foods CONTAINING CHOCOLATE.   Consuming Caffeine may postpone your test.    PLEASE DO NOT EAT OR DRINK THE MORNING OF YOUR TEST.  Water is ok to drink with your morning medications.    Please hold these medications the day of test:  Vitamins/Supplements    DIABETIC PATIENTS:  if insulin dependent:  please take one third of your insulin with two pieces of dry toast and a small juice.  Bring remaining 2/3   insulin and oral diabetic medications with you to your test.    PLEASE NO TOBACCO PRODUCTS BETWEEN SCANS  You will NOT need a driver for  this test.  But are welcome to bring a visitor with you.   Visitors will not be able to accompany you back to stress room.   Please do not bring children to the nuclear stress test.    TEST FINDINGS:   You will receive the results of the test within 7 business days of completion of this test by telephone. If you have any questions concerning your nuclear stress test or if you do not hear from your cardiologist/or nurse with 7 business days, please call our office.

## 2023-12-29 NOTE — Progress Notes
 Date of Service: 12/29/2023    Kaiden Pech Majd Tissue is a 78 y.o. male.       HPI       Dear Dr. Jadene Maxwell:    It was a pleasure to see Marc Nelson in my office today for his cardiovascular followup.     He is a very pleasant 78 year old gentleman, who is well known to me for now over 10 years.  He has a history of coronary artery disease, status post 5-vessel bypass surgery in January of 2013.  In addition to that, he has a history of mild-to-moderate aortic stenosis; carotid artery disease, status post left carotid endarterectomy; complete heart block, status post permanent pacemaker; hypertension; paroxysmal atrial fibrillation and hyperlipidemia.    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  He has had quite a few accidents in the form.  He also has been significantly fighting with balance issues.  He has had a couple of falls.  Because of that he was referred for Watchman device.  He had a successful Watchman device in August.  45 days later he had a transesophageal echocardiogram which revealed well-seated Watchman device.  His apixaban was then changed to aspirin and clopidogrel.  He is now on daily aspirin.    He was scheduled to see me in August but came in today because he has been having more frequent chest pains.  He describes it as a discomfort in the center of the chest.  Appears to be somewhat sharp.  Not particular related to physical activity.  The pain has become more frequent at least few times a day.  If he is working at that time he has to stop and rest for a while and the pain gets better.  He is also noticed that he is getting more short of breath.  He says walking just 30 to 40 yards will make him short of breath.  He is still working in the farm but he said he is slowing down and is given his form to his son mostly.    EKG is not very helpful as patient is in complete heart block and is AV paced. Blood pressures are very well-controlled.    His last echocardiogram was a transesophageal echocardiogram which showed mild to moderate aortic stenosis with normal left ventricular function.  His Watchman device was well-seated with no leakage or clots around the device.    Recommendations today are as follows:  1.  Will start him on a very small dose of isosorbide 30 mg once a day to see if that helps with chest pain.  2.  Will schedule him for a will get a regadenoson  stress MPI to rule out any progressive coronary artery disease.  3.  Will schedule him for a echocardiogram to see if there is any progression of his aortic stenosis.  4.  Patient is advised to go to the emergency room if the chest pain does not get better after using 2 sublingual nitroglycerin.  So far he has not had to use the sublingual nitroglycerin at all.  5.  Will keep his August appointment for now.  I will follow-up with him after these tests are completed.    Total time spent with Brigid Canada including medication changes and reviewing my recommendation and counseling was 35 minutes in the clinic.        ([id number])  Vitals:    12/29/23 0728 12/29/23 0737   BP: 127/69 103/59   BP Source: Arm, Left Upper Arm, Right Upper   Pulse: 63    SpO2: 95%    O2 Device: None (Room air)    PainSc: Zero    Weight: 81.2 kg (179 lb)    Height: 177.8 cm (5' 10)      Body mass index is 25.68 kg/m?Aaron Aas     Past Medical History  Patient Active Problem List    Diagnosis Date Noted    Presence of Watchman left atrial appendage closure device 05/19/2023    Post-operative wound abscess 04/10/2023    PAF (paroxysmal atrial fibrillation) (CMS-HCC) 04/06/2023    Type III open fracture of distal end of left tibia 11/28/2022    Pain, acute due to trauma 11/28/2022    Impaired mobility and ADLs 11/28/2022    Trauma 29/51/8841    Pacemaker lead malfunction, initial encounter 12/15/2021    Asymptomatic stenosis of left carotid artery 11/27/2021 Pacemaker lead fracture 11/10/2021    Left-sided carotid artery disease 11/10/2021    Hyperlipemia 06/30/2017    Sinus node dysfunction (CMS-HCC) 05/25/2016     Pacemaker in place.       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 Bolivar Bushman      Essential hypertension 05/25/2016    Bradycardia 03/24/2016     03/24/2016  dual chamber MDT ppm implanted, ILR removed       S/P CABG (coronary artery bypass graft) 04/22/2015    Dizziness 12/27/2013    Paroxysmal A-fib (CMS-HCC) 09/03/2011     06/20/2012 Holter monitor: revealed normal sinus rhythm, short runs of supraventricular arrhythmias, maximum of 10 beats were noted. There were no episodes of any sustained ventricular or supraventricular arrhythmias noted.   06/21/2012 Echo: EF 60 %, normal LV systolic function. Mild mitral and tricuspid regurgitations. PA pressures were 33 mmHg. LA size 5.4 cm.   07/23/2013 Stress thallium: The patient exercised for 11 minute and 54 seconds. During peak exercise, there was 1 mm horizontal ST depression in II, III, aVF, and V5 and V6. However, SPECT images did not show any significant ischemia. Ejection fraction was noted to be 61 percent.   12/27/13 Holter: The predominating rhythm was NS. Rare Supraventricular Ectopic Beats were observed including Atrial Couplets and a Triplet. Rare Ventricular Ectopic Beats were recognized including Rare Couplets. Patient complained of SOB and weakness. No correlation of his symptoms were noted to any underlying arrhythmia.   06/21/2014 Exercise stress thallium: noted to be normal.   05/06/15 Echo: The LV EF is about 60%. The LA is mildly enlarged. Mild MR & mild TR noted. The PA systolic pressure is normal. LA size 4.5 cm.   10/20/2015 Regadenoson stress Cardiolite [facility]): no evidence of any significant ischemia and normal ejection fraction at 69%.  10/24/2015 Echo: normal left ventricular function.  Ejection fraction 65%.  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 LA size 4.3 cm.      11/24/2015 30-day looping event monitor: multiple episodes of atrial fibrillation and flutter with ventricular rates ranging between 80-110 beats per minute.  One episode of atrial fibrillation was associated with complaints of  heart racing  01/06/16 s/p Medtronic LINQ cardiac memory loop recorder at the anterior chest wall (Dr. Bolivar Bushman)       Thrombocytopenia 08/26/2011    AKI (acute kidney injury) 08/26/2011    CAD (coronary artery disease) 08/23/2011    Chest pain 07/30/2011    SOB (shortness of breath) 07/15/2011    Dyslipidemia 10/22/2009    DM II (diabetes mellitus, type II), controlled (CMS-HCC) 10/22/2009    HTN, goal below 140/90 01/02/2009         Review of Systems   Constitutional: Negative.   HENT: Negative.     Eyes: Negative.    Cardiovascular:  Positive for chest pain and dyspnea on exertion.   Respiratory:  Positive for shortness of breath.    Endocrine: Negative.    Hematologic/Lymphatic: Negative.    Skin: Negative.    Musculoskeletal: Negative.    Gastrointestinal: Negative.    Genitourinary: Negative.    Neurological: Negative.    Psychiatric/Behavioral: Negative.     Allergic/Immunologic: Negative.        Physical Exam  General Appearance: resting comfortably, no acute distress   Neck Veins: neck veins are not distended   Thyroid: no nodules, masses, tenderness or enlargement   Chest Inspection: chest is normal in appearance, CABG incision   Respiratory Effort: breathing is unlabored, no respiratory distress   Auscultation/Percussion: lungs clear to auscultation, no rales, rhonchi, or wheezing   PMI: PMI not enlarged or displaced   Cardiac Rhythm: regular rhythm and normal rate   Cardiac Auscultation: Normal S1 & S2, no S3 or S4, no rub   Murmurs: soft systolic ejection murmur 2/6 at apex   Carotid Arteries: normal carotid upstroke bilaterally, radiation of the systolic ejection murmur noted over both carotids.  Pedal Pulses: normal symmetric pedal pulses   Lower Extremity Edema: no lower extremity edema   Abdominal Exam: soft, non-tender, no masses, bowel sounds normal   Abdominal Aorta: nonpalpable abdominal aorta; no abdominal bruits   Liver & Spleen: no organomegaly   Neurologic Exam: neurological assessment grossly intact   Orientation: oriented to time, place and person     Cardiovascular Studies  June 20, 2012: A 24-hour Holter monitor revealed normal sinus rhythm, short runs of supraventricular arrhythmias, maximum of 10 beats were noted. There were no episodes of any sustained ventricular or supraventricular arrhythmias noted. June 21, 2012: Echo Doppler ejection fraction 60 percent, normal left ventricular systolic function. Mild mitral and tricuspid regurgitations. PA pressures were 33 mmHg. July 23, 2013, stress thallium: The patient exercised for 11 minute and 54 seconds. During peak exercise, there was 1 mm horizontal ST depression in II, III, aVF, and V5 and V6. However, SPECT images did not show any significant ischemia. Ejection fraction was noted to be 61 percent. June 21, 2014, exercise stress thallium noted to be normal. EKG today shows sinus bradycardia, rate of 46 beats per minute. First-degree AV block. No significant ST-T changes are noted. No significant changes compared to patient's previous electrocardiogram done on June 19, 2014   October 20, 2015, regadenoson stress Cardiolite done at [facility] revealed no evidence of any significant ischemia and normal ejection fraction at 69%.  October 24, 2015, echo Doppler shows normal left ventricular function.  Ejection fraction 65%.  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    November 24, 2015, a 30-day looping event  monitor showed multiple episodes of atrial fibrillation and flutter with ventricular rates ranging between 80-110 beats per minute. One episode of atrial fibrillation was associated with complaints of heart racing.  May 15, 2018 stress thallium: No evidence of any significant ischemia.  Normal left ventricular function.  May 13, 2018 echo Doppler: Normal left ventricular function ejection fraction 60%.  Normal diastolic function.  Normal left atrial pressure.  Mild mitral regurgitation and mild tricuspid regurgitation.  Aortic valve sclerosis with slightly reduced opening.  November 22, 2019 echocardiogram: Normal left ventricular function.  Mild aortic stenosis.  November 22, 2019 stress thallium: No evidence of any significant ischemia.  December 05, 2020 echocardiogram: Normal ventricular size with mild concentric left ventricular hypertrophy.  Left ventricular systolic function is normal with ejection fraction of 60 to 65%. Diastolic function is indeterminate.  Moderate dilatation of the left atrium.  Aortic valve is sclerotic with slightly reduced opening.  No significant change from last year.  Estimated PA pressures are 32 mmHg.  August 21, 2021 stress MPI:  SUMMARY/OPINION:  This study is probably normal with no evidence of significant myocardial ischemia.  There is a small sized, mild intensity, reversible perfusion defect in the apical inferior wall that is more prominent on the stress upright images compared to the stress supine images.  There is no associated regional wall motion abnormality.  This is likely suggestive of diaphragmatic attenuation artifact.  Left ventricular systolic function is normal. There are no high risk prognostic indicators present.  The pharmacologic ECG portion of the study is nondiagnostic for ischemia  September 18, 2021 echocardiogram:  The left ventricular systolic function is normal. The visually estimated ejection fraction is 65%.  Grade I (mild) left ventricular diastolic dysfunction. Normal left atrial pressure.  The right ventricle is mildly dilated with preserved systolic function. Pacemaker lead present in the ventricle.  Biatrial dilatation (LA-moderately dilated, RA-mildly dilated).  There is mild mitral annular calcification without stenosis.  Trace mitral valve regurgitation, mild tricuspid valve regurgitation.  Severely sclerotic aortic valve, mild stenosis (MG ~ 14 mmHg, peak velocity= 2.2 m/sec, DI= 0.41 , AVA= 1.4 cm?), trace regurgitation.  Estimated Peak Systolic PA Pressure 33 mmHg  September 18, 2021 carotid duplex:  Mild-moderate atheromatous plaque visualized in bilateral common and internal carotid arteries.  The proximal internal carotid artery has severe heterogeneous plaque with turbulent flow and hemodynamic evidence suggestive of 80-99% stenosis.  Monophasic flow distal to the stenosis. (Severity of stenosis increased compared to prior study 08/23/2011)  There is normal antegrade flow in bilateral vertebral arteries  No evidence of proximal subclavian stenosis bilaterally  October 01, 2021 pacemaker interrogation: Device functioning good  October 23, 2021 CTA of the neck:  1. Calcified atheromatous plaque in proximal left internal carotid artery   with 99% stenosis of the distal bulb.   2. Atheromatous irregularity of the proximal right internal carotid artery   with 50% stenosis.   3. Calcified plaque in proximal right vertebral artery with greater than   90% stenosis.   4. 50% stenosis proximal left vertebral artery   November 27, 2021 Echo:  Left ventricular systolic function is borderline. The visually estimated ejection fraction is 50%. The ejection fraction by Simpson's biplane method is 54%. 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 Elevated left atrial pressure  The right ventricle is mildly dilated. 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  LV systolic function was more dynamic in that study.  The aortic valve appeared similar visually and the measured peak velocity was ~2.2-2.5 m/s.  Left atrial pressure estimation is higher in the current study--the average E/e' ratio in the previous study was ~= 9.  December 02, 2021 left carotid endarterectomy.  Dec 15, 2021 atrial and ventricular lead extraction with placement of a new dual-chamber pacemaker generator.  April 06, 2023 placement of Watchman device.  May 19, 2023 transesophageal echocardiogram:  Well-seated 24 mm Watchman FLX PRO with effective occlusion of the left atrial appendage. No evidence of device related thrombus or peridevice leak.   The left ventricle is normal in size and systolic function. Estimated LVEF of 55%.  Probably normal right ventricular size and systolic function.  Biatrial dilatation.  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   Sclerotic aortic valve with mild to moderate aortic stenosis. Vmax 2.3 m/s. AVA by planimetry of 1.5 cm?.  Mild mitral valve regurgitation  Mild to moderate tricuspid valve regurgitation  Estimated PASP of 27 mmHg + CVP.  Dec 16, 2023 pacemaker interrogation: Device functioning good.  EKG today: AV paced rhythm at a rate of 64 bpm.  No significant changes compared to patient's previous electrocardiogram done on September 21, 2023.    Cardiovascular Health Factors  Vitals BP Readings from Last 3 Encounters:   12/29/23 103/59   09/21/23 128/74   07/11/23 136/76     Wt Readings from Last 3 Encounters:   12/29/23 81.2 kg (179 lb)   09/21/23 80.3 kg (177 lb 1.6 oz)   07/11/23 84.2 kg (185 lb 9.6 oz)     BMI Readings from Last 3 Encounters:   12/29/23 25.68 kg/m?   09/21/23 25.41 kg/m?   07/11/23 26.63 kg/m?      Smoking Social History     Tobacco Use   Smoking Status Never   Smokeless Tobacco Never      Lipid Profile Cholesterol   Date Value Ref Range Status   10/13/2023 86  Final     HDL   Date Value Ref Range Status   10/13/2023 41  Final     LDL   Date Value Ref Range Status   10/13/2023 38  Final     Triglycerides   Date Value Ref Range Status   10/13/2023 37  Final      Blood Sugar Hemoglobin A1C   Date Value Ref Range Status   10/13/2023 8.1 (A)  Final     Glucose   Date Value Ref Range Status   07/20/2023 155 (H)  Final   04/12/2023 153 (H) 70 - 100 MG/DL Final   16/05/9603 540 (  H) 70 - 100 MG/DL Final     Glucose, POC   Date Value Ref Range Status   05/19/2023 131 (H) 70 - 100 MG/DL Final   13/24/4010 272 (H) 70 - 100 MG/DL Final   53/66/4403 474 (H) 70 - 100 MG/DL Final          Problems Addressed Today  Encounter Diagnoses   Name Primary?    Cardiovascular symptoms Yes    Coronary artery disease involving native heart without angina pectoris, unspecified vessel or lesion type     S/P CABG (coronary artery bypass graft)     Paroxysmal A-fib (CMS-HCC)     Essential hypertension     Dyslipidemia     Presence of Watchman left atrial appendage closure device     Stenosis of left carotid artery     S/P carotid endarterectomy     Cardiac pacemaker in situ          Assessment and Plan     1.  Coronary artery disease status post coronary artery bypass graft surgery.  Patient having recurrent chest discomfort.  Chest pain has some typical and some atypical features.  Will schedule patient for a stress MPI.  2.  Paroxysmal atrial fibrillation patient maintaining a AV paced rhythm.  Patient now s/p Watchman device.  Patient now on daily aspirin.  3.  Sick sinus syndrome status atrial and ventricular lead replacement as well as placement of a new pacemaker generator.  Device is functioning good.  4.  Hypertension.  Blood pressures are very well controlled.  5.  Dyslipidemia.  Good lipid numbers.  6.  Diabetes mellitus.  Patient follows his PCP.  7.  High-grade calcified plaque in the proximal left internal carotid artery with 99% stenosis.  Patient is s/p left carotid endarterectomy.  Patient follows with vascular surgery.  Recent carotid duplex study revealed less than 50% stenosis in both carotid arteries.  8.  Aortic stenosis.  Today patient complains of worsening dyspnea on exertion.  Ordered echocardiogram to reassess LV function and aortic stenosis.         Current Medications (including today's revisions)   acetaminophen SR (TYLENOL ARTHRITIS PAIN) 650 mg tablet Take one tablet by mouth every 8 hours as needed.    aspirin EC 81 mg tablet Take one tablet by mouth daily. Take with food.    atorvastatin (LIPITOR) 80 mg tablet Take one tablet by mouth at bedtime daily.    cetirizine (ZYRTEC) 10 mg tablet Take one tablet by mouth daily as needed.    cholecalciferol (Vitamin D3) (VITAMIN D-3) 1,000 units tablet Take one tablet by mouth daily.      citalopram (CELEXA) 40 mg tablet Take one-half tablet by mouth daily.    cyanocobalamin (vitamin B-12) 1,000 mcg tablet Take one tablet by mouth daily.    dorzolamide (TRUSOPT) 2 % ophthalmic solution Apply one drop to both eyes twice daily.    ezetimibe (ZETIA) 10 mg tablet TAKE ONE TABLET BY MOUTH DAILY    finasteride (PROSCAR) 5 mg tablet Take one tablet by mouth daily.    insulin glargine-yfgn (SEMGLEE PEN) 100 unit/mL (3 mL) injectable PEN Inject forty Units under the skin daily.    JARDIANCE 25 mg tablet Take one tablet by mouth daily.    metoprolol tartrate (LOPRESSOR) 50 mg tablet Take one-half tablet by mouth twice daily.    nitroglycerin (NITROSTAT) 0.4 mg tablet DISSOLVE 1 TAB UNDER TONGUE FOR CHEST PAIN - IF PAIN REMAINS AFTER 5 MIN,  CALL 911 AND REPEAT DOSE. MAX 3 TABS IN 15 MINUTES    NOVOLOG FLEXPEN 100 unit/mL injection PEN Inject 8 units with breakfast, 8-12 units with lunch, and 12-15 units with supper  Indications: 3 units at  lunch, 18 units with dinner    pantoprazole DR (PROTONIX) 20 mg tablet Take one tablet by mouth daily.    travoprost (TRAVATAN Z) 0.004 % ophthalmic solution Apply one drop to both eyes twice daily.    ubidecarenone (COENZYME Q10 PO) Take 1 capsule by mouth daily.

## 2024-01-03 ENCOUNTER — Encounter: Admit: 2024-01-03 | Discharge: 2024-01-03 | Payer: MEDICARE

## 2024-01-05 ENCOUNTER — Encounter: Admit: 2024-01-05 | Discharge: 2024-01-05 | Payer: MEDICARE

## 2024-01-05 ENCOUNTER — Ambulatory Visit: Admit: 2024-01-05 | Discharge: 2024-01-05 | Payer: MEDICARE

## 2024-01-09 ENCOUNTER — Encounter: Admit: 2024-01-09 | Discharge: 2024-01-09 | Payer: MEDICARE

## 2024-01-10 ENCOUNTER — Encounter: Admit: 2024-01-10 | Discharge: 2024-01-10 | Payer: MEDICARE

## 2024-01-11 ENCOUNTER — Encounter: Admit: 2024-01-11 | Discharge: 2024-01-11 | Payer: MEDICARE

## 2024-01-11 ENCOUNTER — Ambulatory Visit: Admit: 2024-01-11 | Discharge: 2024-01-11 | Payer: MEDICARE

## 2024-02-16 ENCOUNTER — Encounter: Admit: 2024-02-16 | Discharge: 2024-02-16 | Payer: MEDICARE

## 2024-03-01 ENCOUNTER — Encounter: Admit: 2024-03-01 | Discharge: 2024-03-01 | Payer: MEDICARE

## 2024-03-01 DIAGNOSIS — Z95818 Presence of other cardiac implants and grafts: Secondary | ICD-10-CM

## 2024-03-01 DIAGNOSIS — I480 Paroxysmal atrial fibrillation: Principal | ICD-10-CM

## 2024-03-18 ENCOUNTER — Encounter: Admit: 2024-03-18 | Discharge: 2024-03-18 | Payer: MEDICARE

## 2024-03-19 ENCOUNTER — Encounter: Admit: 2024-03-19 | Discharge: 2024-03-19 | Payer: MEDICARE

## 2024-03-20 ENCOUNTER — Ambulatory Visit: Admit: 2024-03-20 | Discharge: 2024-03-21 | Payer: MEDICARE

## 2024-03-20 ENCOUNTER — Encounter: Admit: 2024-03-20 | Discharge: 2024-03-20 | Payer: MEDICARE

## 2024-03-20 DIAGNOSIS — I6522 Occlusion and stenosis of left carotid artery: Secondary | ICD-10-CM

## 2024-03-20 DIAGNOSIS — Z8639 Personal history of other endocrine, nutritional and metabolic disease: Secondary | ICD-10-CM

## 2024-03-20 DIAGNOSIS — Z9889 Other specified postprocedural states: Secondary | ICD-10-CM

## 2024-03-20 DIAGNOSIS — Z950 Presence of cardiac pacemaker: Secondary | ICD-10-CM

## 2024-03-20 DIAGNOSIS — I25119 Atherosclerotic heart disease of native coronary artery with unspecified angina pectoris: Secondary | ICD-10-CM

## 2024-03-20 DIAGNOSIS — Z95818 Presence of other cardiac implants and grafts: Secondary | ICD-10-CM

## 2024-03-20 DIAGNOSIS — R0609 Other forms of dyspnea: Secondary | ICD-10-CM

## 2024-03-20 DIAGNOSIS — Z951 Presence of aortocoronary bypass graft: Secondary | ICD-10-CM

## 2024-03-20 MED ORDER — ISOSORBIDE MONONITRATE 60 MG PO TB24
60 mg | ORAL_TABLET | Freq: Every morning | ORAL | 3 refills | 90.00000 days | Status: AC
Start: 2024-03-20 — End: ?

## 2024-03-20 NOTE — Progress Notes
 Date of Service: 03/20/2024    Marc Nelson is a 78 y.o. male.       HPI       Dear Dr. Marine:    It was a pleasure to see Marc Nelson in my office today for his cardiovascular followup.     He is a very pleasant 78 year old gentleman, who is well known to me for now over 10 years.  He has a history of coronary artery disease, status post 5-vessel bypass surgery in January of 2013.  In addition to that, he has a history of mild-to-moderate aortic stenosis; carotid artery disease, status post left carotid endarterectomy; complete heart block, status post permanent pacemaker; hypertension; paroxysmal atrial fibrillation and hyperlipidemia.  He recently had placement of a Watchman device.  He is not taking an aspirin  81 mg daily.  His annual transesophageal echocardiogram is next week.    He continues to have intermittent chest discomfort.  He is a very Art therapist.  He is out and about whether it is hot or cold outside.  Sometimes he says when he is working outside he will get this sharp chest pains.  He continues to do his work.  The pain comes and goes.  The pain is so short-lived that he does not ever have to take any sublingual nitroglycerin .  So last time I ordered a stress MPI.  This was completed in May of this year.  The stress MPI did not reveal any significant ischemia.  He was also complaining of shortness of breath so we did a follow-up echocardiogram to see any progression with his aortic stenosis.  Aortic stenosis still appears to be mild to moderate and the LV function appears to be normal.    Symptoms have not significantly changed from last time.  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  He is going to turn 78 years old next month.  I have advised him to pace himself many times.  Symptoms have not changed and the stress MPI did not reveal any significant ischemia and the echocardiogram essentially was unchanged.    His pacemaker is working good.  No events were noted.  Today's EKG does not show any significant changes.  There are some nonspecific T wave inversions which have been back-and-forth as compared to his previous EKGs.    Recommendations:  1.  Patient is advised to pace himself.  2.  We will increase his isosorbide  from 30 to 60 mg once a day.  A new prescription has been sent.  3.  Patient counseled and educated about symptoms of exertional angina.  4.  Patient is advised to stay indoors when it is very hot outside and if he is outside make sure he is drinking plenty of oral fluids and keeping himself well-hydrated.  5.  Follow-up again in 6 months unless there is any change in symptoms.    Total time spent with Marc Nelson including medication changes as well as reviewing his stress test results, echocardiogram results, pacemaker check and my recommendations along with counseling was 35 minutes in the clinic.      (INR:[id number])                      Vitals:    03/20/24 9161 03/20/24 0846   BP: 120/78 116/78   BP Source: Arm, Left Upper Arm, Right Upper   Pulse: 61  SpO2:  95%   PainSc: Three    Weight: 82.2 kg (181 lb 3.2 oz)    Height: 177.8 cm (5' 10)      Body mass index is 26 kg/m?SABRA     Past Medical History  Patient Active Problem List    Diagnosis Date Noted    Presence of Watchman left atrial appendage closure device 05/19/2023    Post-operative wound abscess 04/10/2023    PAF (paroxysmal atrial fibrillation) (CMS-HCC) 04/06/2023    Type III open fracture of distal end of left tibia 11/28/2022    Pain, acute due to trauma 11/28/2022    Impaired mobility and ADLs 11/28/2022    Trauma 95/87/7975    Pacemaker lead malfunction, initial encounter 12/15/2021    Asymptomatic stenosis of left carotid artery 11/27/2021    Pacemaker lead fracture 11/10/2021    Left-sided carotid artery disease 11/10/2021    Hyperlipemia 06/30/2017    Sinus node dysfunction (CMS-HCC) 05/25/2016     Pacemaker in place. 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 Roselyn      Essential hypertension 05/25/2016    Bradycardia 03/24/2016     03/24/2016  dual chamber MDT ppm implanted, ILR removed       S/P CABG (coronary artery bypass graft) 04/22/2015    Dizziness 12/27/2013    Paroxysmal A-fib (CMS-HCC) 09/03/2011     06/20/2012 Holter monitor: revealed normal sinus rhythm, short runs of supraventricular arrhythmias, maximum of 10 beats were noted. There were no episodes of any sustained ventricular or supraventricular arrhythmias noted.   06/21/2012 Echo: EF 60 %, normal LV systolic function. Mild mitral and tricuspid regurgitations. PA pressures were 33 mmHg. LA size 5.4 cm.   07/23/2013 Stress thallium: The patient exercised for 11 minute and 54 seconds. During peak exercise, there was 1 mm horizontal ST depression in II, III, aVF, and V5 and V6. However, SPECT images did not show any significant ischemia. Ejection fraction was noted to be 61 percent.   12/27/13 Holter: The predominating rhythm was NS. Rare Supraventricular Ectopic Beats were observed including Atrial Couplets and a Triplet. Rare Ventricular Ectopic Beats were recognized including Rare Couplets. Patient complained of SOB and weakness. No correlation of his symptoms were noted to any underlying arrhythmia.   06/21/2014 Exercise stress thallium: noted to be normal.   05/06/15 Echo: The LV EF is about 60%. The LA is mildly enlarged. Mild MR & mild TR noted. The PA systolic pressure is normal. LA size 4.5 cm.   10/20/2015 Regadenoson  stress Cardiolite [facility]): no evidence of any significant ischemia and normal ejection fraction at 69%.  10/24/2015 Echo: normal left ventricular function.  Ejection fraction 65%.  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 LA size 4.3 cm.      11/24/2015 30-day looping event monitor: multiple episodes of atrial fibrillation and flutter with ventricular rates ranging between 80-110 beats per minute.  One episode of atrial fibrillation was associated with complaints of heart racing  01/06/16 s/p Medtronic LINQ cardiac memory loop recorder at the anterior chest wall (Dr. Roselyn)       Thrombocytopenia 08/26/2011    AKI (acute kidney injury) 08/26/2011    CAD (coronary  artery disease) 08/23/2011    Chest pain 07/30/2011    SOB (shortness of breath) 07/15/2011    Dyslipidemia 10/22/2009    DM II (diabetes mellitus, type II), controlled (CMS-HCC) 10/22/2009    HTN, goal below 140/90 01/02/2009         Review of Systems   Constitutional: Negative.   HENT: Negative.     Eyes: Negative.    Cardiovascular:  Positive for chest pain and dyspnea on exertion.   Respiratory:  Positive for shortness of breath.    Endocrine: Negative.    Hematologic/Lymphatic: Negative.    Skin: Negative.    Musculoskeletal: Negative.    Gastrointestinal: Negative.    Genitourinary: Negative.    Neurological: Negative.    Psychiatric/Behavioral: Negative.     Allergic/Immunologic: Negative.        Physical Exam  General Appearance: resting comfortably, no acute distress   Neck Veins: neck veins are not distended   Thyroid: no nodules, masses, tenderness or enlargement   Chest Inspection: chest is normal in appearance, CABG incision   Respiratory Effort: breathing is unlabored, no respiratory distress   Auscultation/Percussion: lungs clear to auscultation, no rales, rhonchi, or wheezing   PMI: PMI not enlarged or displaced   Cardiac Rhythm: regular rhythm and normal rate   Cardiac Auscultation: Normal S1 & S2, no S3 or S4, no rub   Murmurs: soft systolic ejection murmur 2/6 at apex   Carotid Arteries: normal carotid upstroke bilaterally, radiation of the systolic ejection murmur noted over both carotids.  Pedal Pulses: normal symmetric pedal pulses   Lower Extremity Edema: no lower extremity edema   Abdominal Exam: soft, non-tender, no masses, bowel sounds normal   Abdominal Aorta: nonpalpable abdominal aorta; no abdominal bruits   Liver & Spleen: no organomegaly   Neurologic Exam: neurological assessment grossly intact   Orientation: oriented to time, place and person     Cardiovascular Studies  June 20, 2012: A 24-hour Holter monitor revealed normal sinus rhythm, short runs of supraventricular arrhythmias, maximum of 10 beats were noted. There were no episodes of any sustained ventricular or supraventricular arrhythmias noted. June 21, 2012: Echo Doppler ejection fraction 60 percent, normal left ventricular systolic function. Mild mitral and tricuspid regurgitations. PA pressures were 33 mmHg. July 23, 2013, stress thallium: The patient exercised for 11 minute and 54 seconds. During peak exercise, there was 1 mm horizontal ST depression in II, III, aVF, and V5 and V6. However, SPECT images did not show any significant ischemia. Ejection fraction was noted to be 61 percent. June 21, 2014, exercise stress thallium noted to be normal. EKG today shows sinus bradycardia, rate of 46 beats per minute. First-degree AV block. No significant ST-T changes are noted. No significant changes compared to patient's previous electrocardiogram done on June 19, 2014   October 20, 2015, regadenoson  stress Cardiolite done at [facility] revealed no evidence of any significant ischemia and normal ejection fraction at 69%.  October 24, 2015, echo Doppler shows normal left ventricular function.  Ejection fraction 65%.  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 November 24, 2015, a 30-day looping event monitor showed multiple episodes of atrial fibrillation and flutter with ventricular rates ranging between 80-110 beats per minute. One episode of atrial fibrillation was associated with complaints of heart racing.  May 15, 2018 stress thallium: No evidence of any significant ischemia.  Normal left ventricular function.  May 13, 2018 echo Doppler: Normal left ventricular function ejection fraction 60%.  Normal diastolic function.  Normal left atrial pressure.  Mild mitral regurgitation and mild tricuspid regurgitation.  Aortic valve sclerosis with slightly reduced opening.  November 22, 2019 echocardiogram: Normal left ventricular function.  Mild aortic stenosis.  November 22, 2019 stress thallium: No evidence of any significant ischemia.  December 05, 2020 echocardiogram: Normal ventricular size with mild concentric left ventricular hypertrophy.  Left ventricular systolic function is normal with ejection fraction of 60 to 65%. Diastolic function is indeterminate.  Moderate dilatation of the left atrium.  Aortic valve is sclerotic with slightly reduced opening.  No significant change from last year.  Estimated PA pressures are 32 mmHg.  August 21, 2021 stress MPI:  SUMMARY/OPINION:  This study is probably normal with no evidence of significant myocardial ischemia.  There is a small sized, mild intensity, reversible perfusion defect in the apical inferior wall that is more prominent on the stress upright images compared to the stress supine images.  There is no associated regional wall motion abnormality.  This is likely suggestive of diaphragmatic attenuation artifact.  Left ventricular systolic function is normal. There are no high risk prognostic indicators present.  The pharmacologic ECG portion of the study is nondiagnostic for ischemia  September 18, 2021 echocardiogram:  The left ventricular systolic function is normal. The visually estimated ejection fraction is 65%.  Grade I (mild) left ventricular diastolic dysfunction. Normal left atrial pressure.  The right ventricle is mildly dilated with preserved systolic function. Pacemaker lead present in the ventricle.  Biatrial dilatation (LA-moderately dilated, RA-mildly dilated).  There is mild mitral annular calcification without stenosis.  Trace mitral valve regurgitation, mild tricuspid valve regurgitation.  Severely sclerotic aortic valve, mild stenosis (MG ~ 14 mmHg, peak velocity= 2.2 m/sec, DI= 0.41 , AVA= 1.4 cm?), trace regurgitation.  Estimated Peak Systolic PA Pressure 33 mmHg  September 18, 2021 carotid duplex:  Mild-moderate atheromatous plaque visualized in bilateral common and internal carotid arteries.  The proximal internal carotid artery has severe heterogeneous plaque with turbulent flow and hemodynamic evidence suggestive of 80-99% stenosis.  Monophasic flow distal to the stenosis. (Severity of stenosis increased compared to prior study 08/23/2011)  There is normal antegrade flow in bilateral vertebral arteries  No evidence of proximal subclavian stenosis bilaterally  October 01, 2021 pacemaker interrogation: Device functioning good  October 23, 2021 CTA of the neck:  1. Calcified atheromatous plaque in proximal left internal carotid artery   with 99% stenosis of the distal bulb.   2. Atheromatous irregularity of the proximal right internal carotid artery   with 50% stenosis.   3. Calcified plaque in proximal right vertebral artery with greater than   90% stenosis.   4. 50% stenosis proximal left vertebral artery   November 27, 2021 Echo:  Left ventricular systolic function is borderline. The visually estimated ejection fraction is 50%. The ejection fraction by Simpson's biplane method is 54%. 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 Elevated left atrial pressure  The right ventricle is mildly dilated. 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  LV systolic function was more dynamic in that study.  The aortic valve appeared similar visually and the measured peak velocity was ~2.2-2.5 m/s.  Left atrial pressure estimation is higher in the current study--the average E/e' ratio in the previous study was ~= 9.  December 02, 2021 left carotid endarterectomy.  Dec 15, 2021 atrial and ventricular lead extraction with placement of a new dual-chamber pacemaker generator.  April 06, 2023 placement of Watchman device.  May 19, 2023 transesophageal echocardiogram:  Well-seated 24 mm Watchman FLX PRO with effective occlusion of the left atrial appendage. No evidence of device related thrombus or peridevice leak.   The left ventricle is normal in size and systolic function. Estimated LVEF of 55%.  Probably normal right ventricular size and systolic function.  Biatrial dilatation.  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   Sclerotic aortic valve with mild to moderate aortic stenosis. Vmax 2.3 m/s. AVA by planimetry of 1.5 cm?.  Mild mitral valve regurgitation  Mild to moderate tricuspid valve regurgitation  Estimated PASP of 27 mmHg + CVP.  Jan 05, 2024 stress MPI:  SUMMARY/OPINION: This study is normal.  No definite perfusion abnormalities are present.  Global left ventricular function is within normal limits.  High risk indicators are not noted.  Jan 11, 2024 echocardiogram:  Normal sized left ventricle.  Moderately increased LV wall thickness, consistent with concentric remodeling.   Normal left ventricular systolic function with estimated ejection fraction of 60-65% by visual assessment. No regional wall motion abnormalities.   Diastolic dysfunction with elevated estimated left atrial pressure.  Right ventricle is not very well-visualized.  Mildly reduced right ventricular systolic function.  Severely dilated left atrium.  Mildly dilated right atrium..   Mild tricuspid regurgitation.  PA systolic pressure could not be reliably estimated due to inadequate TR jet.   Mild dilatation of the sinus of Valsalva at 4.1 cm  No pericardial effusion.  March 18, 2024 pacemaker interrogation: Device functioning good.  No events noted.  EKG today shows AV paced rhythm.  Compared from previous electrocardiogram nonspecific T wave inversions are noted in the inferior leads III and aVF.    Cardiovascular Health Factors  Vitals BP Readings from Last 3 Encounters:   03/20/24 116/78   01/11/24 112/73   12/29/23 103/59     Wt Readings from Last 3 Encounters:   03/20/24 82.2 kg (181 lb 3.2 oz)   01/11/24 78.5 kg (173 lb)   12/29/23 81.2 kg (179 lb)     BMI Readings from Last 3 Encounters:   03/20/24 26.00 kg/m?   01/11/24 24.82 kg/m?   12/29/23 25.68 kg/m?      Smoking Social History     Tobacco Use   Smoking Status Never   Smokeless Tobacco Never      Lipid Profile Cholesterol   Date Value Ref Range Status   01/12/2024 87  Final     HDL   Date  Value Ref Range Status   01/12/2024 37  Final     LDL   Date Value Ref Range Status   01/12/2024 37  Final Triglycerides   Date Value Ref Range Status   01/12/2024 65  Final      Blood Sugar Hemoglobin A1C   Date Value Ref Range Status   01/12/2024 7.8 (H)  Final     Glucose   Date Value Ref Range Status   01/12/2024 214 (H)  Final   07/20/2023 155 (H)  Final   04/12/2023 153 (H) 70 - 100 MG/DL Final     Glucose, POC   Date Value Ref Range Status   05/19/2023 131 (H) 70 - 100 MG/DL Final   91/72/7975 815 (H) 70 - 100 MG/DL Final   91/72/7975 860 (H) 70 - 100 MG/DL Final          Problems Addressed Today  Encounter Diagnoses   Name Primary?    HTN, goal below 140/90 Yes    Dyslipidemia     Coronary artery disease involving native heart without angina pectoris, unspecified vessel or lesion type     Paroxysmal A-fib (CMS-HCC)     Coronary artery disease involving native coronary artery of native heart with angina pectoris     S/P CABG (coronary artery bypass graft)     Presence of Watchman left atrial appendage closure device     Essential hypertension     DOE (dyspnea on exertion)     S/P carotid endarterectomy     Stenosis of left carotid artery     Cardiac pacemaker in situ     History of diabetes mellitus          Assessment and Plan     1.  Coronary artery disease status post coronary artery bypass graft surgery.  Patient having recurrent chest discomfort.  Recent stress MPI did not reveal any significant ischemia.  I have increased the isosorbide  today.  2.  Paroxysmal atrial fibrillation patient maintaining a AV paced rhythm.  Patient now s/p Watchman device.  Patient now on daily aspirin .  His annual TEE is scheduled next week.  3.  Sick sinus syndrome status atrial and ventricular lead replacement as well as placement of a new pacemaker generator.  Device is functioning good.  4.  Hypertension.  Blood pressures are very well controlled.  5.  Dyslipidemia.  Good lipid numbers.  6.  Diabetes mellitus.  Patient follows his PCP.  7.  High-grade calcified plaque in the proximal left internal carotid artery with 99% stenosis.  Patient is s/p left carotid endarterectomy.  Patient follows with vascular surgery.  Recent carotid duplex study revealed less than 50% stenosis in both carotid arteries.  8.  Aortic stenosis.  Mild to moderate on the recent echocardiogram.         Current Medications (including today's revisions)   acetaminophen  SR (TYLENOL  ARTHRITIS PAIN) 650 mg tablet Take one tablet by mouth every 8 hours as needed.    aspirin  EC 81 mg tablet Take one tablet by mouth daily. Take with food.    atorvastatin  (LIPITOR) 80 mg tablet Take one tablet by mouth at bedtime daily.    cetirizine  (ZYRTEC ) 10 mg tablet Take one tablet by mouth daily as needed.    cholecalciferol  (Vitamin D3) (VITAMIN D-3) 1,000 units tablet Take one tablet by mouth daily.      citalopram  (CELEXA ) 40 mg tablet Take one-half tablet by mouth daily.    cyanocobalamin (vitamin B-12)  1,000 mcg tablet Take one tablet by mouth daily.    dorzolamide  (TRUSOPT ) 2 % ophthalmic solution Apply one drop to both eyes twice daily.    ezetimibe  (ZETIA ) 10 mg tablet TAKE ONE TABLET BY MOUTH DAILY    finasteride  (PROSCAR ) 5 mg tablet Take one tablet by mouth daily.    insulin  glargine-yfgn (SEMGLEE  PEN) 100 unit/mL (3 mL) injectable PEN Inject forty Units under the skin daily.    isosorbide  mononitrate ER (IMDUR ) 30 mg tablet,extended release 24 hr Take one tablet by mouth every morning.    JARDIANCE  25 mg tablet Take one tablet by mouth daily.    metoprolol  tartrate (LOPRESSOR ) 50 mg tablet Take one-half tablet by mouth twice daily.    nitroglycerin  (NITROSTAT ) 0.4 mg tablet DISSOLVE 1 TAB UNDER TONGUE FOR CHEST PAIN - IF PAIN REMAINS AFTER 5 MIN, CALL 911 AND REPEAT DOSE. MAX 3 TABS IN 15 MINUTES    NOVOLOG  FLEXPEN 100 unit/mL injection PEN Inject 8 units with breakfast, 8-12 units with lunch, and 12-15 units with supper  Indications: 3 units at  lunch, 18 units with dinner    pantoprazole  DR (PROTONIX ) 20 mg tablet Take one tablet by mouth daily.    travoprost (TRAVATAN Z) 0.004 % ophthalmic solution Apply one drop to both eyes twice daily.    ubidecarenone (COENZYME Q10 PO) Take 1 capsule by mouth daily.

## 2024-03-20 NOTE — Patient Instructions
 Follow-Up:    -Thank you for allowing us  to participate in your care today. Your After Visit Summary is being completed by Jayvien Rowlette, RN.    -We would like you to follow up in 6 months with Alben Queen, MD  -You can schedule this appointment on your way out of the office.  However, if you would like to call to make this appointment, please call [phone].    Changes From Today's Office Visit  Increase Imdur  to 60 mg daily    Contacting our office:    -Business Hours: Monday-Friday, 8:00 am-4:30 pm (excluding Holidays).     - How to reach us , important numbers to keep handy   Scheduling:  [phone]   Nurse team: [phone] (monitored during business hours)   After hours triage: [phone]   Financial counselors [phone]    - You can also reach us  through MyChart.      Results & Testing Follow Up:    -Please allow 5-7 business days for the results of any testing to be reviewed. Please call our office if you have not heard from a nurse within this time frame.    -Should you choose to complete testing at an outside facility, please contact our office after completion of testing so that we can ensure that we have received results for your provider to review.    Lab and test results:  As a part of the CARES act, starting 11/15/2019, some results will be released to you via MyChart immediately and automatically.  You may see results before your provider sees them; however, your provider will review all these results and then they, or one of their team, will notify you of result information and recommendations.   Critical results will be addressed immediately, but otherwise, please allow us  time to get back with you prior to you reaching out to us  for questions.  This will usually take about 72 hours for labs and 5-7 days for procedure test results.    -You may receive a survey in the upcoming weeks from [employer] of [facility]. Your feedback is important to us  and helps us  continue to improve patient care and patient satisfaction.

## 2024-03-21 DIAGNOSIS — I2510 Atherosclerotic heart disease of native coronary artery without angina pectoris: Secondary | ICD-10-CM

## 2024-03-21 DIAGNOSIS — I480 Paroxysmal atrial fibrillation: Secondary | ICD-10-CM

## 2024-03-21 DIAGNOSIS — E785 Hyperlipidemia, unspecified: Secondary | ICD-10-CM

## 2024-03-21 DIAGNOSIS — I10 Essential (primary) hypertension: Principal | ICD-10-CM

## 2024-03-23 ENCOUNTER — Encounter: Admit: 2024-03-23 | Discharge: 2024-03-23 | Payer: MEDICARE

## 2024-03-23 DIAGNOSIS — Z950 Presence of cardiac pacemaker: Principal | ICD-10-CM

## 2024-03-27 ENCOUNTER — Encounter: Admit: 2024-03-27 | Discharge: 2024-03-27 | Payer: MEDICARE

## 2024-03-27 ENCOUNTER — Ambulatory Visit: Admit: 2024-03-27 | Discharge: 2024-03-27 | Payer: MEDICARE

## 2024-03-27 MED ORDER — LIDOCAINE (PF) 20 MG/ML (2 %) IJ SOLN
INTRAVENOUS | 0 refills | Status: DC
Start: 2024-03-27 — End: 2024-03-27

## 2024-03-27 MED ORDER — PROPOFOL 10 MG/ML IV EMUL 20 ML (INFUSION)(AM)(OR)
INTRAVENOUS | 0 refills | Status: DC
Start: 2024-03-27 — End: 2024-03-27
  Administered 2024-03-27: 16:00:00 100 ug/kg/min via INTRAVENOUS

## 2024-03-27 MED ORDER — PROPOFOL INJ 10 MG/ML IV VIAL
INTRAVENOUS | 0 refills | Status: DC
Start: 2024-03-27 — End: 2024-03-27

## 2024-03-27 MED ORDER — SODIUM CHLORIDE 0.9 % IV SOLP (OR) 500ML
INTRAVENOUS | 0 refills | Status: DC
Start: 2024-03-27 — End: 2024-03-27

## 2024-03-27 NOTE — Anesthesia Post-Procedure Evaluation
 Post-Anesthesia Evaluation    Name: Marc Nelson      MRN: [id number]     DOB: [birth year]     Age: 78 y.o.     Sex: male   __________________________________________________________________________     Procedure Information       Anesthesia Start Date/Time: 03/27/24 1043    Scheduled providers: Leverne Waddell BROCKS, MD; Casimir Crank, RN; Alexia Ozell PARAS, DO    Procedure: TRANSESOPHAGEAL ECHO    Location: Cardiovascular Medicine: [facility]            Post-Anesthesia Vitals  BP: 141/86 (08/12 1145)  Pulse: 60 (08/12 1145)  Respirations: 18 PER MINUTE (08/12 1145)  SpO2: 95 % (08/12 1145)  O2 Device: None (Room air) (08/12 1145) No vitals data found for the desired time range.        Post Anesthesia Evaluation Note    Evaluation location: Pre/Post  Patient participation: recovered; patient participated in evaluation  Level of consciousness: alert  Pain management: adequate    Hydration: normovolemia  Temperature: 36.0?C - 38.4?C  Airway patency: adequate    Perioperative Events       Post-op nausea and vomiting: no PONV    Postoperative Status  Cardiovascular status: hemodynamically stable  Respiratory status: spontaneous ventilation  Follow-up needed: none        Perioperative Events  There were no known complications for this encounter.

## 2024-03-27 NOTE — Anesthesia Pre-Procedure Evaluation
 Anesthesia Pre-Procedure Evaluation    Name: Marc Nelson      MRN: [id number]     DOB: 07-14-1946     Age: 78 y.o.     Sex: male   _________________________________________________________________________     Procedure Info:   Procedure Information       Date/Time: 03/27/24 1100    Scheduled providers: Leverne Waddell BROCKS, MD    Procedure: TRANSESOPHAGEAL ECHO    Location: Cardiovascular Medicine: [facility]            Physical Assessment  Vital Signs (last filed in past 24 hours):        Glucose, POC   Date/Time Value Ref Range Status   05/19/2023 0928 131 (H) 70 - 100 MG/DL Final     Echo 4/71/74:  Normal sized left ventricle.  Moderately increased LV wall thickness, consistent with concentric remodeling.   Normal left ventricular systolic function with estimated ejection fraction of 60-65% by visual assessment. No regional wall motion abnormalities.   Diastolic dysfunction with elevated estimated left atrial pressure.  Right ventricle is not very well-visualized.  Mildly reduced right ventricular systolic function.  Severely dilated left atrium.  Mildly dilated right atrium..   Mild tricuspid regurgitation.  PA systolic pressure could not be reliably estimated due to inadequate TR jet.   Mild dilatation of the sinus of Valsalva at 4.1 cm  No pericardial effusion.     Patient History   Allergies[1]     Current Medications   Medication Directions   acetaminophen  SR (TYLENOL  ARTHRITIS PAIN) 650 mg tablet Take one tablet by mouth every 8 hours as needed.   aspirin  EC 81 mg tablet Take one tablet by mouth daily. Take with food.   atorvastatin  (LIPITOR) 80 mg tablet Take one tablet by mouth at bedtime daily.   cetirizine  (ZYRTEC ) 10 mg tablet Take one tablet by mouth daily as needed.   cholecalciferol  (Vitamin D3) (VITAMIN D-3) 1,000 units tablet Take one tablet by mouth daily.     citalopram  (CELEXA ) 40 mg tablet Take one-half tablet by mouth daily.   cyanocobalamin (vitamin B-12) 1,000 mcg tablet Take one tablet by mouth daily.   dorzolamide  (TRUSOPT ) 2 % ophthalmic solution Apply one drop to both eyes twice daily.   ezetimibe  (ZETIA ) 10 mg tablet TAKE ONE TABLET BY MOUTH DAILY   finasteride  (PROSCAR ) 5 mg tablet Take one tablet by mouth daily.   insulin  glargine-yfgn (SEMGLEE  PEN) 100 unit/mL (3 mL) injectable PEN Inject forty Units under the skin daily.   isosorbide  mononitrate ER (IMDUR ) 60 mg ER tablet Take one tablet by mouth every morning.   JARDIANCE  25 mg tablet Take one tablet by mouth daily.   metoprolol  tartrate (LOPRESSOR ) 50 mg tablet Take one-half tablet by mouth twice daily.   nitroglycerin  (NITROSTAT ) 0.4 mg tablet DISSOLVE 1 TAB UNDER TONGUE FOR CHEST PAIN - IF PAIN REMAINS AFTER 5 MIN, CALL 911 AND REPEAT DOSE. MAX 3 TABS IN 15 MINUTES   NOVOLOG  FLEXPEN 100 unit/mL injection PEN Inject 8 units with breakfast, 8-12 units with lunch, and 12-15 units with supper  Indications: 3 units at  lunch, 18 units with dinner   pantoprazole  DR (PROTONIX ) 20 mg tablet Take one tablet by mouth daily.   travoprost (TRAVATAN Z) 0.004 % ophthalmic solution Apply one drop to both eyes twice daily.   ubidecarenone (COENZYME Q10 PO) Take 1 capsule by mouth daily.       Review of Systems/Medical History  Patient summary reviewed  Pertinent labs reviewed    PONV Screening: Non-smoker    No history of anesthetic complications    No family history of anesthetic complications      Airway - negative        Pulmonary           Asthma (rare albuterol  use)      Shortness of breath      Cardiovascular          Beta Blocker therapy: Yes        CIED: pacemaker          Manufacture: [employer]tr[employer]              Device Indication(s): SSS          Not Pacemaker Dependent                    CIED interrogated last 12 months (03/28/23)          Device settings:  DDDR              Battery life > 1 year              Magnet response: Asynchronous mode and 85 bpm                Hypertension          Valvular problems/murmurs (known murmur):  MR and AI      Aortic Stenosis: moderate        No past MI    Coronary artery disease      Coronary artery bypass graft (x5 2013)        No palpitations          Dysrhythmias (metoprolol , Eliquis ); atrial fibrillation    No angina (denies since CABG 2013)      PVD (Carotid artery stenosis s/p CEA 12/02/2021)      Hyperlipidemia      No orthopnea      S/p watchman 04/06/23      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)      GI/Hepatic/Renal             GERD (PPI), well controlled            Neuro/Psych         Sensory deficit (hearing deficit)        Psychiatric history          Depression      Musculoskeletal         Fractures (ankle fxs)      Bilateral knee replacement        Endocrine/Other       Diabetes, poorly controlled, type 2, using insulin       Most recent Hgb A1C:7.2 - 9          Constitution - negative       Physical Exam    Airway Findings      Mallampati: III      TM distance: >3 FB      Neck ROM: full      Mouth opening: good      Airway patency: adequate    Dental Findings:       Poor dentition          Cardiovascular Findings:       Rhythm: regular      Rate: normal    Pulmonary Findings:       Breath sounds clear to auscultation.    Neurological Findings:  Alert and oriented x 3    Constitutional findings:       No acute distress       Previous Airway Procedure Notes Displaying the 3 most recent records      Date Mask Difficulty Techninque used for succesful ETT placement Blade Type Blade Size View with successful placement Number of attempts    12/07/22 1 - vent by mask direct laryngoscopy Macintosh 4 grade I - full view of glottis 1    11/27/22 0 - not attempted Not documented Not documented Not documented Not documented 1            Patient Lines/Drains/Airways Status       Active Lines:       Name Placement date Placement time Site Days    Peripheral IV 04/10/23 1306 Right Antecubital 20 G 04/10/23  1306  -- 352    Peripheral IV 01/05/24 0900 Right Anterior;Distal Upper Arm 22 G 01/05/24 0900  -- 82                  Diagnostic Tests  Hematology:   Lab Results   Component Value Date    HGB 13.9 01/12/2024    HCT 42.3 01/12/2024    PLTCT 161 01/12/2024    WBC 5.7 01/12/2024    NEUT 41 04/12/2023    ANC 2.80 04/12/2023    ALC 2.29 04/12/2023    MONA 10 04/12/2023    AMC 0.67 04/12/2023    EOSA 16 (H) 04/12/2023    ABC 0.02 04/12/2023    MCV 99.5 01/12/2024    MCH 32.7 01/12/2024    MCHC 32.9 01/12/2024    MPV 9.7 01/12/2024    RDW 14.9 01/12/2024         General Chemistry:   Lab Results   Component Value Date    NA 137 01/12/2024    K 4.1 01/12/2024    CL 112 (H) 01/12/2024    CO2 19 (L) 01/12/2024    GAP 5 04/12/2023    BUN 35 (H) 01/12/2024    CR 1.34 (H) 01/12/2024    GLU 214 (H) 01/12/2024    CA 9.7 01/12/2024    ALBUMIN  3.9 01/12/2024    LACTIC 1.7 12/02/2021    MG 1.9 04/12/2023    TOTBILI 0.4 01/12/2024    PO4 2.7 11/29/2022      Coagulation:   Lab Results   Component Value Date    PTT 71.5 (H) 12/02/2021    INR 1.0 04/06/2023       PAC Plan    Anesthesia Plan    ASA score: 3   Plan: MAC  NPO status: acceptable      Informed Consent  Anesthetic plan and risks discussed with patient.        Plan discussed with: anesthesiologist and CRNA.      Alerts: Yes        CIED           Aortic Stenosis: moderate                 [1]   Allergies  Allergen Reactions    Candesartan  SEE COMMENTS     hyperkalemia    candesartan     Metformin DIARRHEA, STOMACH UPSET and NAUSEA ONLY    Morphine NAUSEA AND VOMITING    Morpholine Salicylate UNKNOWN    Oxycodone  NAUSEA AND VOMITING    Ragweed EYE IRRITATION and SNEEZING    Ragweed Pollen SEE COMMENTS

## 2024-03-29 ENCOUNTER — Encounter: Admit: 2024-03-29 | Discharge: 2024-03-29 | Payer: MEDICARE

## 2024-05-01 ENCOUNTER — Encounter: Admit: 2024-05-01 | Discharge: 2024-05-01 | Payer: MEDICARE

## 2024-05-02 ENCOUNTER — Encounter: Admit: 2024-05-02 | Discharge: 2024-05-02 | Payer: MEDICARE

## 2024-05-31 ENCOUNTER — Encounter: Admit: 2024-05-31 | Discharge: 2024-05-31 | Payer: MEDICARE

## 2024-05-31 NOTE — Telephone Encounter
 05/31/24 Records received form [facility] scanned to chart. CRM

## 2024-06-16 ENCOUNTER — Encounter: Admit: 2024-06-16 | Discharge: 2024-06-16 | Payer: MEDICARE

## 2024-07-02 ENCOUNTER — Encounter: Admit: 2024-07-02 | Discharge: 2024-07-02 | Payer: MEDICARE

## 2024-07-09 ENCOUNTER — Encounter: Admit: 2024-07-09 | Discharge: 2024-07-09 | Payer: MEDICARE

## 2024-07-09 ENCOUNTER — Ambulatory Visit: Admit: 2024-07-09 | Discharge: 2024-07-09 | Payer: MEDICARE

## 2024-07-09 VITALS — BP 128/76 | HR 73 | Temp 97.50000°F | Ht 70.0 in | Wt 185.0 lb

## 2024-07-09 DIAGNOSIS — I6523 Occlusion and stenosis of bilateral carotid arteries: Principal | ICD-10-CM

## 2024-07-09 DIAGNOSIS — E785 Hyperlipidemia, unspecified: Secondary | ICD-10-CM

## 2024-07-09 DIAGNOSIS — Z9889 Other specified postprocedural states: Secondary | ICD-10-CM

## 2024-07-09 DIAGNOSIS — I10 Essential (primary) hypertension: Secondary | ICD-10-CM

## 2024-07-09 DIAGNOSIS — E1159 Type 2 diabetes mellitus with other circulatory complications: Secondary | ICD-10-CM

## 2024-07-09 NOTE — Progress Notes [1]
 Date of Service: 07/09/2024              Chief Complaint   Patient presents with    Follow Up     1 yr Carotid Stenosis       History of Present Illness    This is an 78 year old gentleman with a history of diabetes, hypertension, hyperlipidemia, coronary artery disease, carotid stenosis and chronic kidney disease.  He returns today for a bilateral carotid surveillance ultrasound and for clinical follow-up.    He has bilateral less than 50% carotid stenosis with a ratio on the right of 1.3 and a ratio on the left of 0.9.  He has antegrade flow in his vertebral arteries bilaterally.  He has had no lateralizing TIA or strokelike symptoms.  His main complaint is his balance.    He denies cardiac chest pain.  He reports some dyspnea on exertion.  He denies fever and chills.  He denies ischemic rest pain or tissue breakdown in either foot.  He denies symptoms of claudication when he walks.    He had left carotid endarterectomy by Dr. Lucrecia on 12/02/2021.    He has a history of coronary artery bypass grafting.  He has a youth worker implanted.  He has a left-sided pacemaker but no defibrillator implanted.  He says he has never had a stroke.  He has no history of radiation or chemotherapy.    The most recent hemoglobin A1c that I saw was 7.8 but they think it is closer to 7 now.  His most recent creatinine was 1.34 with a GFR of 54.  He does not smoke.    He takes aspirin , atorvastatin  and Zetia  daily.      Past Medical History:    Abnormal stress test    Acid reflux    Adverse drug reaction    Angina of effort    Arthritis    Asthma    CAD (coronary artery disease)    Cardiac dysrhythmia    Carotid stenosis    Coronary atherosclerosis    COVID-19    Depression    DM II (diabetes mellitus, type II), controlled (CMS-HCC)    Dyslipidemia    Heart murmur    High cholesterol    HLD (hyperlipidemia)    HOH (hard of hearing)    HTN (hypertension)    HX: anticoagulation    Kidney disease    Osteoporosis    Other specified disorders of arteries and arterioles    Pacemaker lead fracture    Paroxysmal A-fib (CMS-HCC)    SSS (sick sinus syndrome) (CMS-HCC)       Surgical History:   Procedure Laterality Date    HX CORONARY ARTERY BYPASS GRAFT  2013    @ [place], x 5 grafts    PACEMAKER INSERTION  10/2015    PPM at [place]--medtronic    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/24/2016    Performed by Roselyn Radford, MD at [facility] CATH LAB    Fluoroscopy N/A 03/24/2016    Performed by Roselyn Radford, MD at [facility] CATH LAB    Removal Implantable Loop Recorder  03/24/2016    Performed by Roselyn Radford, MD at [facility] CATH LAB    KNEE REPLACEMENT Bilateral 06/2020    10-2020    ENDARTERECTOMY CAROTID Left 12/02/2021    Performed by Lucrecia Saddie HERO, DO at [facility] OR    PACEMAKER PLACEMENT  12/15/2021    replacement    REMOVAL PERMANENT PACEMAKER GENERATOR  N/A 12/15/2021    Performed by Roselyn Thedora DEL, MD at [facility] CVOR    REMOVAL PERMANENT PACEMAKER LEAD - DUAL LEAD SYSTEM N/A 12/15/2021    Performed by Roselyn Thedora DEL, MD at [facility] CVOR    INSERTION/ REPLACEMENT PERMANENT PACEMAKER WITH ATRIAL AND VENTRICULAR LEAD N/A 12/15/2021    Performed by Roselyn Thedora DEL, MD at [facility] CVOR    INSERTION/ REPLACEMENT TEMPORARY PACEMAKER LEAD/ CATHETER N/A 12/15/2021    Performed by Roselyn Thedora DEL, MD at [facility] CVOR    TRANSESOPHAGEAL ECHOCARDIOGRAM DURING INTERVENTION N/A 12/15/2021    Performed by Roselyn Thedora DEL, MD at [facility] CVOR    CARDIOTHORACIC SURGERY STANDBY N/A 12/15/2021    Performed by Roselyn Thedora DEL, MD at [facility] CVOR    DEBRIDEMENT WOUND DEEP 20 SQ CM OR LESS - LOWER EXTREMITY Left 11/27/2022    Performed by Delsie Thresa DASEN, MD at [facility] OR    OPEN TREATMENT BIMALLEOLAR ANKLE FRACTURE WITH/ WITHOUT INTERNAL FIXATION Left 12/07/2022    Performed by Delsie Thresa DASEN, MD at [facility] OR    PERCUTANEOUS CLOSURE LEFT ATRIAL APPENDAGE WITH ENDOCARDIAL IMPLANT N/A 04/06/2023    Performed by Vertie Amelie BROCKS, MD at [facility] CATH LAB    INTRACARDIAC ECHOCARDIOGRAPHY N/A 04/06/2023    Performed by Vertie Amelie BROCKS, MD at [facility] CATH LAB    CARDIAC CATHERIZATION      CARDIOVASCULAR STRESS TEST      CARPAL TUNNEL RELEASE Bilateral     CATARACT REMOVAL WITH IMPLANT      COLONOSCOPY      DOPPLER ECHOCARDIOGRAPHY      ELECTROCARDIOGRAM      FINGER SURGERY      right hand 5th finger laceration repair    HX HEART CATHETERIZATION      HX HERNIA REPAIR Left     HX KNEE SURGERY      right    HX ROTATOR CUFF REPAIR Bilateral     LEFT HEART CATHETERIZATION      MYOCARDIAL PERFUSION IMG STUDY      US  CAROTID DOPPLER BILATERAL         Allergies:  Allergies[1]    Medication List:   acetaminophen  SR (TYLENOL  ARTHRITIS PAIN) 650 mg tablet Take one tablet by mouth every 8 hours as needed.    aspirin  EC 81 mg tablet Take one tablet by mouth daily. Take with food.    atorvastatin  (LIPITOR) 80 mg tablet Take one tablet by mouth at bedtime daily.    cetirizine  (ZYRTEC ) 10 mg tablet Take one tablet by mouth daily as needed.    cholecalciferol  (Vitamin D3) (VITAMIN D-3) 1,000 units tablet Take one tablet by mouth daily.      citalopram  (CELEXA ) 40 mg tablet Take one-half tablet by mouth daily.    cyanocobalamin (vitamin B-12) 1,000 mcg tablet Take one tablet by mouth daily.    dorzolamide  (TRUSOPT ) 2 % ophthalmic solution Apply one drop to both eyes twice daily.    ezetimibe  (ZETIA ) 10 mg tablet TAKE ONE TABLET BY MOUTH DAILY    finasteride  (PROSCAR ) 5 mg tablet Take one tablet by mouth daily.    insulin  glargine-yfgn (SEMGLEE  PEN) 100 unit/mL (3 mL) injectable PEN Inject forty Units under the skin daily.    isosorbide  mononitrate ER (IMDUR ) 60 mg ER tablet Take one tablet by mouth every morning.    JARDIANCE  25 mg tablet Take one tablet by mouth daily.    metoprolol  tartrate (LOPRESSOR ) 50 mg tablet Take one-half tablet by mouth  twice daily.    nitroglycerin  (NITROSTAT ) 0.4 mg tablet DISSOLVE 1 TAB UNDER TONGUE FOR CHEST PAIN - IF PAIN REMAINS AFTER 5 MIN, CALL 911 AND REPEAT DOSE. MAX 3 TABS IN 15 MINUTES    NOVOLOG  FLEXPEN 100 unit/mL injection PEN Inject 8 units with breakfast, 8-12 units with lunch, and 12-15 units with supper  Indications: 3 units at  lunch, 18 units with dinner    pantoprazole  DR (PROTONIX ) 20 mg tablet Take one tablet by mouth daily.    travoprost (TRAVATAN Z) 0.004 % ophthalmic solution Apply one drop to both eyes twice daily.    ubidecarenone (COENZYME Q10 PO) Take 1 capsule by mouth daily.       Social History:   reports that he has never smoked. He has never used smokeless tobacco. He reports that he does not drink alcohol and does not use drugs.    Family History   Problem Relation Name Age of Onset    Cancer Mother Glendale         colon cancer    Hypertension Mother Glendale     Heart murmur Mother Glendale     Cancer Father Helayne         prostate    Hypertension Father Helayne     Stroke Father Helayne     Cancer-Prostate Father Helayne     Heart murmur Brother Richard     Cancer-Prostate Brother Richard     Hypertension Brother Richard        Review of Systems            Vitals:    07/09/24 0857 07/09/24 0858   BP: 123/77 128/76   BP Source: Arm, Left Upper Arm, Right Upper   Pulse: 76 73   Temp: 36.4 ?C (97.5 ?F)    TempSrc: Temporal    PainSc: Zero    Weight: 83.9 kg (185 lb)    Height: 177.8 cm (5' 10)      Body mass index is 26.54 kg/m?SABRA     Physical Exam  Vitals and nursing note reviewed. Exam conducted with a chaperone present.   Constitutional:       General: He is not in acute distress.     Appearance: Normal appearance. He is well-developed and normal weight. He is not ill-appearing or diaphoretic.   HENT:      Head: Normocephalic.   Eyes:      General:         Right eye: No discharge.         Left eye: No discharge.   Neck:      Vascular: Carotid bruit present.      Trachea: Phonation normal.      Comments: Has a faint left neck scar.  Cardiovascular:      Rate and Rhythm: Normal rate and regular rhythm.      Pulses:           Carotid pulses are  on the right side with bruit and  on the left side with bruit.       Radial pulses are 2+ on the right side and 2+ on the left side.      Heart sounds: Murmur heard.   Pulmonary:      Effort: Pulmonary effort is normal. No respiratory distress.      Breath sounds: Normal breath sounds.   Musculoskeletal:      Cervical back: Normal range of motion and neck supple.   Skin:  General: Skin is warm and dry.      Capillary Refill: Capillary refill takes less than 2 seconds.      Coloration: Skin is not cyanotic or mottled.   Neurological:      General: No focal deficit present.      Mental Status: He is alert and oriented to person, place, and time.      Motor: Motor function is intact.      Coordination: Coordination is intact.      Gait: Gait is intact.      Comments: His grip strength is equal bilaterally.   Psychiatric:         Attention and Perception: Attention and perception normal.         Mood and Affect: Mood and affect normal.         Speech: Speech normal.         Behavior: Behavior normal. Behavior is cooperative.         Thought Content: Thought content normal.         Cognition and Memory: Cognition normal.             Assessment and Plan:    1. Bilateral carotid artery stenosis  VAS US  DUPLEX SCAN CAROTID BILATERAL      2. History of left-sided carotid endarterectomy-Dr. Lucrecia, 12/02/2021  VAS US  DUPLEX SCAN CAROTID BILATERAL      3. HTN, goal below 140/90        4. Dyslipidemia        5. Diabetic vasculopathy (CMS-HCC)          He has bilateral less than 50% carotid stenosis with no lateralizing TIA or strokelike symptoms.  I discussed with him his results and recommendations for follow-up.  We discussed that a walker may work better than a cane to help with his stability and balance.  He reports that he follows up with cardiology regarding his chest pain and shortness of breath.    He should continue his aspirin , atorvastatin  and Zetia  as directed.    We will follow-up again in 1 year with a bilateral carotid surveillance ultrasound and office visit at that time.    We gave him information on carotid disease for his visit summary.  He had no additional questions or concerns.me    >  20     Minutes spent on chart review, physical exam and documentation.                [1]   Allergies  Allergen Reactions    Candesartan  SEE COMMENTS     hyperkalemia    candesartan     Metformin DIARRHEA, STOMACH UPSET and NAUSEA ONLY    Morphine NAUSEA AND VOMITING    Morpholine Salicylate UNKNOWN    Oxycodone  NAUSEA AND VOMITING    Ragweed EYE IRRITATION and SNEEZING    Ragweed Pollen SEE COMMENTS

## 2024-09-12 ENCOUNTER — Encounter: Admit: 2024-09-12 | Discharge: 2024-09-12 | Payer: MEDICARE
